# Patient Record
Sex: Female | Born: 1970 | Race: White | Hispanic: No | Smoking: Never smoker
Health system: Southern US, Community
[De-identification: ages and names within clinical notes are randomized; demographics above are authoritative.]

## PROBLEM LIST (undated history)

## (undated) DIAGNOSIS — I10 Essential (primary) hypertension: Secondary | ICD-10-CM

## (undated) DIAGNOSIS — Z85820 Personal history of malignant melanoma of skin: Secondary | ICD-10-CM

## (undated) DIAGNOSIS — Z9889 Other specified postprocedural states: Secondary | ICD-10-CM

## (undated) DIAGNOSIS — N83209 Unspecified ovarian cyst, unspecified side: Secondary | ICD-10-CM

## (undated) DIAGNOSIS — C439 Malignant melanoma of skin, unspecified: Secondary | ICD-10-CM

## (undated) DIAGNOSIS — IMO0002 Reserved for concepts with insufficient information to code with codable children: Principal | ICD-10-CM

## (undated) DIAGNOSIS — F419 Anxiety disorder, unspecified: Secondary | ICD-10-CM

## (undated) DIAGNOSIS — N921 Excessive and frequent menstruation with irregular cycle: Secondary | ICD-10-CM

## (undated) DIAGNOSIS — J301 Allergic rhinitis due to pollen: Secondary | ICD-10-CM

## (undated) DIAGNOSIS — K649 Unspecified hemorrhoids: Secondary | ICD-10-CM

## (undated) DIAGNOSIS — R112 Nausea with vomiting, unspecified: Secondary | ICD-10-CM

## (undated) DIAGNOSIS — N840 Polyp of corpus uteri: Principal | ICD-10-CM

## (undated) HISTORY — DX: Polyp of corpus uteri: N84.0

## (undated) HISTORY — DX: Personal history of malignant melanoma of skin: Z85.820

## (undated) HISTORY — DX: Anxiety disorder, unspecified: F41.9

## (undated) HISTORY — DX: Excessive and frequent menstruation with irregular cycle: N92.1

## (undated) HISTORY — DX: Unspecified hemorrhoids: K64.9

## (undated) HISTORY — DX: Unspecified ovarian cyst, unspecified side: N83.209

## (undated) HISTORY — DX: Allergic rhinitis due to pollen: J30.1

## (undated) HISTORY — DX: Essential (primary) hypertension: I10

## (undated) HISTORY — PX: ECTOPIC PREGNANCY SURGERY: SHX613

## (undated) HISTORY — DX: Reserved for concepts with insufficient information to code with codable children: IMO0002

---

## 1998-05-29 ENCOUNTER — Other Ambulatory Visit: Admission: RE | Admit: 1998-05-29 | Discharge: 1998-05-29 | Payer: Self-pay | Admitting: Gynecology

## 2001-07-29 ENCOUNTER — Inpatient Hospital Stay (HOSPITAL_COMMUNITY): Admission: RE | Admit: 2001-07-29 | Discharge: 2001-07-31 | Payer: Self-pay | Admitting: Obstetrics and Gynecology

## 2003-06-13 ENCOUNTER — Ambulatory Visit (HOSPITAL_COMMUNITY): Admission: AD | Admit: 2003-06-13 | Discharge: 2003-06-13 | Payer: Self-pay | Admitting: Obstetrics and Gynecology

## 2003-09-07 ENCOUNTER — Ambulatory Visit (HOSPITAL_COMMUNITY): Admission: AD | Admit: 2003-09-07 | Discharge: 2003-09-07 | Payer: Self-pay | Admitting: Obstetrics and Gynecology

## 2003-11-30 ENCOUNTER — Inpatient Hospital Stay (HOSPITAL_COMMUNITY): Admission: RE | Admit: 2003-11-30 | Discharge: 2003-12-02 | Payer: Self-pay | Admitting: Obstetrics and Gynecology

## 2005-11-17 HISTORY — PX: SKIN CANCER EXCISION: SHX779

## 2007-07-21 ENCOUNTER — Ambulatory Visit (HOSPITAL_COMMUNITY): Admission: RE | Admit: 2007-07-21 | Discharge: 2007-07-21 | Payer: Self-pay | Admitting: Obstetrics and Gynecology

## 2008-12-08 ENCOUNTER — Other Ambulatory Visit: Admission: RE | Admit: 2008-12-08 | Discharge: 2008-12-08 | Payer: Self-pay | Admitting: Obstetrics and Gynecology

## 2009-07-04 ENCOUNTER — Ambulatory Visit (HOSPITAL_COMMUNITY): Admission: RE | Admit: 2009-07-04 | Discharge: 2009-07-04 | Payer: Self-pay | Admitting: Obstetrics & Gynecology

## 2009-10-22 ENCOUNTER — Ambulatory Visit (HOSPITAL_COMMUNITY): Admission: RE | Admit: 2009-10-22 | Discharge: 2009-10-22 | Payer: Self-pay | Admitting: Pediatrics

## 2009-11-13 ENCOUNTER — Ambulatory Visit (HOSPITAL_COMMUNITY): Admission: RE | Admit: 2009-11-13 | Discharge: 2009-11-13 | Payer: Self-pay | Admitting: Obstetrics and Gynecology

## 2009-11-13 ENCOUNTER — Ambulatory Visit (HOSPITAL_COMMUNITY): Admission: EM | Admit: 2009-11-13 | Discharge: 2009-11-13 | Payer: Self-pay | Admitting: Obstetrics and Gynecology

## 2010-10-09 ENCOUNTER — Other Ambulatory Visit: Admission: RE | Admit: 2010-10-09 | Discharge: 2010-10-09 | Payer: Self-pay | Admitting: Obstetrics and Gynecology

## 2011-04-04 NOTE — Discharge Summary (Signed)
NAME:  Melissa Harrington, Melissa Harrington                         ACCOUNT NO.:  [id number]   MEDICAL RECORD NO.:  [id number]                   PATIENT TYPE:  INP   LOCATION:  A410                                 FACILITY:  [facility]   PHYSICIAN:  Lazaro Arms, M.D.                DATE OF BIRTH:  06/01/1971   DATE OF ADMISSION:  11/30/2003  DATE OF DISCHARGE:                                 DISCHARGE SUMMARY   DISCHARGE DIAGNOSES:  1. Status post a repeat cesarean section, tubal ligation.  2. Unremarkable postoperative course.   PROCEDURES:  Repeat cesarean section and tubal ligation by Tilda Burrow,  M.D.   Please refer to the transcribed History and Physical, antepartum chart and  operative note for details for admissions to the hospital.   HOSPITAL COURSE:  The patient was admitted postoperatively.  Her cesarean  section went well, without problems.  She tolerated clear liquids and  regular diet.  She was voiding without symptoms.  She was ambulatory and had  normal bowel function progression.  Her postoperative day #1:  Hemoglobin  and hematocrit was 12.1 and 35.  White count 8400.  She was tolerating oral  pain medicine.  Prior to discharge, her incision was clean, dry and intact.  JP drain was putting out minimally and was removed on the evening of  postoperative day #1.  She was discharged to home on the morning of  postoperative day #2 on Motrin and Tylox for pain.  Instructions and  precautions for return and she will be seen in the office on Wednesday, to  have her incision assessed and staples removed.     ___________________________________________                                         Lazaro Arms, M.D.   Loraine Maple  D:  12/02/2003  T:  12/02/2003  Job:  [id number]

## 2011-04-04 NOTE — H&P (Signed)
NAME:  Melissa Harrington, Melissa Harrington                         ACCOUNT NO.:  [id number]   MEDICAL RECORD NO.:  [id number]                   PATIENT TYPE:  AMB   LOCATION:  DAY                                  FACILITY:  [facility]   PHYSICIAN:  Tilda Burrow, M.D.              DATE OF BIRTH:  March 15, 1971   DATE OF ADMISSION:  11/29/2002  DATE OF DISCHARGE:                                HISTORY & PHYSICAL   ADMITTING DIAGNOSES:  1. Pregnancy 38-1/[redacted] weeks gestation.  2. Prior cesarean section, not for trial of labor.  3. Desire for elective permanent sterilization with planned, wide excision     of cicatrix.   HISTORY OF PRESENT ILLNESS:  This 40 year old female gravida 5, para 2, AB  2, 2 prior ectopic pregnancies is admitted, at this time, for repeat  cesarean section, tubal ligation with planned wide excision of the old  cicatrix. Ayaat has been followed through our office since initial prenatal  visit April 21, 2003.  LMP March 02, 2003 placing menstrual [facility] December 06, 2003 with corresponding first and second trimester ultrasounds.  She has had  15 prenatal visits, appropriate weight gain, fundal height growth, and  desires to proceed to a permanent sterilization at the time of repeat  cesarean section.  Issues regarding tummy tuck have been discussed.  Auri  only has mild abdominal laxity, but has some retraction just above the old  cicatrix and desires revision of this.   PAST MEDICAL HISTORY:  1. Pregnancy induced hypertension with 2 pregnancies.  2. History of multiple allergies.   PAST SURGICAL HISTORY:  1. Cesarean section x2.  2. Laparoscopic treatment of ectopic pregnancy x2, 1995 and 1997 allegedly     in the right tube each time.   SOCIAL HISTORY:  Married, Arts development officer.   HABITS:  Negative for cigarettes, alcohol, and recreational drugs.   PHYSICAL EXAMINATION:  VITAL SIGNS:  Height 5 feet 6 inches.  Weight 175-  1/2.  Blood pressure 122/74.  GENERAL:  Exam shows a healthy,  cheerful, energetic Caucasian female who is  tolerating the pregnancy well.  Pupils equal round, reactive.  Extraocular  movements are intact.  NECK:  Supple.  Trachea midline.  CHEST:  Clear to auscultation.  ABDOMEN:  Term size fetus.  Vertex presentation.  Fundal height 38 cm.  Fetal heart rate noted at 140, good fetal movement present.  Vertex  presentation confirmed.  PELVIC:  Exam deferred.   PRIOR LABORATORY DATA:  Includes blood type O positive, hemoglobin 14.3,  hematocrit 42.8, hepatitis, HIV, GC and Chlamydia all negative.  MSAFP  declined.   Additionally the patient's husband, Raiford Noble, will be at the delivery, under  spinal anesthesia.  Dr. Milford Cage has been notified.  Circumcision is planned.  She plans to breast feed.   ADDENDUM:  Additionally the patient has a small, 1 cm umbilical hernia which  is sensitive at this time.  It is our planned effort to attempt to close  this with permanent suture placed through the Pfannenstiel incision if  possible.  If necessary it might be necessary to use a separate incision  which has been discussed and the patient is aware.     ___________________________________________                                         Tilda Burrow, M.D.   JVF/MEDQ  D:  11/29/2003  T:  11/29/2003  Job:  [id number]   cc:   Tilda Burrow, M.D.  [address] [place]  [place] [address]  Fax: [phone]

## 2011-04-04 NOTE — Op Note (Signed)
NAME:  Melissa Harrington, Melissa Harrington                         ACCOUNT NO.:  [id number]   MEDICAL RECORD NO.:  [id number]                   PATIENT TYPE:  INP   LOCATION:  A404                                 FACILITY:  [facility]   PHYSICIAN:  Melissa Harrington, M.D.              DATE OF BIRTH:  06-22-1971   DATE OF PROCEDURE:  11/30/2003  DATE OF DISCHARGE:                                 OPERATIVE REPORT   PREOPERATIVE DIAGNOSES:  1. Pregnancy 39 weeks, repeat cesarean section, not for trial of labor.  2. Elective permanent sterilization.  3. Umbilical hernia.   POSTOPERATIVE DIAGNOSES:  1. Pregnancy 39 weeks, repeat cesarean section, not for trial of labor.  2. Elective permanent sterilization.   PROCEDURE:  1. Repeat low transverse cervical cesarean section.  2. Left partial salpingectomy.  3. Wide excision of cicatrix.   SURGEON:  Melissa Harrington, M.D.   ASSISTANTAmie Harrington, CST   ANESTHESIA:  Spinal.   COMPLICATIONS:  None.   FINDINGS:  Absence of right tube.  Normal appearing ovaries bilaterally.  Healthy, 8 pound 0.5 ounce female infant, Apgars 9 and 9; cared for by Dr.  Marvel Plan.  Umbilical hernia was inspected from beneath and there was only a  slight dimpling of the fascia, but not a complete fascial defect; so, after  discussion with the patient, we decided against any attempts to revise the  protuberant umbilicus.   DETAILS OF PROCEDURE:  The patient was taken to the operating room and  spinal anesthesia introduced; prepped; Foley catheter inserted; and abdomen  prepped and draped for lower abdominal surgery.  The old scar, which was  actually healed quite well with slight overlap on the left side, was widely  excised removing a 2 cm wide ellipse of skin and underlying fatty tissues.  The fascia was opened transversely and dissected off the underlying rectus  muscles with only slight difficulty due to dense adhesions.  The peritoneal  cavity was entered without difficulty and  bladder flap developed on the  lower uterine segment.  A transverse uterine incision was made and then the  amniotic fluid membranes ruptured and opened; and the infant delivered by  fundal pressure and manual guidance of the vertex.  The cord was clamped,  and Melissa Harrington was passed to Dr. Marvel Plan for his care.  See his notes for  additional details.  Apgars 9 and 9 were assigned.   Cord blood samples were obtained.  The placenta delivered Melissa Harrington  presentation, intact.  Antibiotic irrigation of the uterine cavity then  performed and then the anterior lower uterine segment, uterine incision,  closed using a single layer of running, locking, 2-0 chromic. The patient  then had 2-0 chromic reapproximation of the bladder flap.   STERILIZATION:  Sterilization was then performed by identifying, first that  the right tube had been removed in its entirety and there were no  significant adhesions in the area.  The left tube was inspected, identified  to its fimbriated end; a midsegment knuckle of the tube doubly ligated by  incarcerating a knuckle of tube and 2-0 chromic ligature. The specimen was  removed for histology confirmation and the procedure considered complete.   The peritoneal cavity was irrigated with antibiotic solution.  A couple of  point cautery sites on the bladder flap cauterized and then the anterior  peritoneum closed with a running 2-0 chromic.  The rectus muscles were  closed using interrupted 2-0 chromic.  Fascial edges were trimmed to improve  lower abdominal tone and then pulled together with continuous running #0  Vicryl.  The subcutaneous tissues were reapproximated with interrupted 2-0  plain over a flat, JP drain placed in subcu fatty space area.  Point cautery  was used, as necessary, to achieve hemostasis and then the staple closure of  the skin completed the surgical procedure with bandages placed with slight  pressure dressing.       ___________________________________________                                            Melissa Harrington, M.D.   JVF/MEDQ  D:  11/30/2003  T:  11/30/2003  Job:  [id number]

## 2011-11-18 DIAGNOSIS — N921 Excessive and frequent menstruation with irregular cycle: Secondary | ICD-10-CM

## 2011-11-18 HISTORY — DX: Excessive and frequent menstruation with irregular cycle: N92.1

## 2012-06-30 ENCOUNTER — Other Ambulatory Visit: Payer: Self-pay | Admitting: Obstetrics and Gynecology

## 2012-06-30 ENCOUNTER — Other Ambulatory Visit (HOSPITAL_COMMUNITY)
Admission: RE | Admit: 2012-06-30 | Discharge: 2012-06-30 | Disposition: A | Discharge status: Home / Self Care | Payer: 59 | Source: Ambulatory Visit | Attending: Obstetrics and Gynecology | Admitting: Obstetrics and Gynecology

## 2012-06-30 DIAGNOSIS — Z01419 Encounter for gynecological examination (general) (routine) without abnormal findings: Secondary | ICD-10-CM | POA: Insufficient documentation

## 2013-10-10 ENCOUNTER — Ambulatory Visit (INDEPENDENT_AMBULATORY_CARE_PROVIDER_SITE_OTHER): Payer: 59 | Admitting: Adult Health

## 2013-10-10 ENCOUNTER — Encounter: Payer: Self-pay | Admitting: Adult Health

## 2013-10-10 ENCOUNTER — Encounter (INDEPENDENT_AMBULATORY_CARE_PROVIDER_SITE_OTHER): Payer: Self-pay

## 2013-10-10 VITALS — BP 118/70 | Ht 68.0 in | Wt 160.0 lb

## 2013-10-10 DIAGNOSIS — K649 Unspecified hemorrhoids: Secondary | ICD-10-CM | POA: Insufficient documentation

## 2013-10-10 DIAGNOSIS — L293 Anogenital pruritus, unspecified: Secondary | ICD-10-CM

## 2013-10-10 DIAGNOSIS — N898 Other specified noninflammatory disorders of vagina: Secondary | ICD-10-CM

## 2013-10-10 DIAGNOSIS — IMO0002 Reserved for concepts with insufficient information to code with codable children: Secondary | ICD-10-CM

## 2013-10-10 HISTORY — DX: Reserved for concepts with insufficient information to code with codable children: IMO0002

## 2013-10-10 HISTORY — DX: Unspecified hemorrhoids: K64.9

## 2013-10-10 MED ORDER — HYDROCORTISONE ACE-PRAMOXINE 1-1 % RE CREA: 1.0000 "application " | TOPICAL_CREAM | Freq: Two times a day (BID) | RECTAL | Status: DC

## 2013-10-10 NOTE — Patient Instructions (Signed)
Hemorrhoids Hemorrhoids are swollen veins around the rectum or anus. There are two types of hemorrhoids:   Internal hemorrhoids. These occur in the veins just inside the rectum. They may poke through to the outside and become irritated and painful.  External hemorrhoids. These occur in the veins outside the anus and can be felt as a painful swelling or hard lump near the anus. CAUSES  Pregnancy.   Obesity.   Constipation or diarrhea.   Straining to have a bowel movement.   Sitting for long periods on the toilet.  Heavy lifting or other activity that caused you to strain.  Anal intercourse. SYMPTOMS   Pain.   Anal itching or irritation.   Rectal bleeding.   Fecal leakage.   Anal swelling.   One or more lumps around the anus.  DIAGNOSIS  Your caregiver may be able to diagnose hemorrhoids by visual examination. Other examinations or tests that may be performed include:   Examination of the rectal area with a gloved hand (digital rectal exam).   Examination of anal canal using a small tube (scope).   A blood test if you have lost a significant amount of blood.  A test to look inside the colon (sigmoidoscopy or colonoscopy). TREATMENT Most hemorrhoids can be treated at home. However, if symptoms do not seem to be getting better or if you have a lot of rectal bleeding, your caregiver may perform a procedure to help make the hemorrhoids get smaller or remove them completely. Possible treatments include:   Placing a rubber band at the base of the hemorrhoid to cut off the circulation (rubber band ligation).   Injecting a chemical to shrink the hemorrhoid (sclerotherapy).   Using a tool to burn the hemorrhoid (infrared light therapy).   Surgically removing the hemorrhoid (hemorrhoidectomy).   Stapling the hemorrhoid to block blood flow to the tissue (hemorrhoid stapling).  HOME CARE INSTRUCTIONS   Eat foods with fiber, such as whole grains, beans,  nuts, fruits, and vegetables. Ask your doctor about taking products with added fiber in them (fibersupplements).  Increase fluid intake. Drink enough water and fluids to keep your urine clear or pale yellow.   Exercise regularly.   Go to the bathroom when you have the urge to have a bowel movement. Do not wait.   Avoid straining to have bowel movements.   Keep the anal area dry and clean. Use wet toilet paper or moist towelettes after a bowel movement.   Medicated creams and suppositories may be used or applied as directed.   Only take over-the-counter or prescription medicines as directed by your caregiver.   Take warm sitz baths for 15 20 minutes, 3 4 times a day to ease pain and discomfort.   Place ice packs on the hemorrhoids if they are tender and swollen. Using ice packs between sitz baths may be helpful.   Put ice in a plastic bag.   Place a towel between your skin and the bag.   Leave the ice on for 15 20 minutes, 3 4 times a day.   Do not use a donut-shaped pillow or sit on the toilet for long periods. This increases blood pooling and pain.  SEEK MEDICAL CARE IF:  You have increasing pain and swelling that is not controlled by treatment or medicine.  You have uncontrolled bleeding.  You have difficulty or you are unable to have a bowel movement.  You have pain or inflammation outside the area of the hemorrhoids. MAKE SURE YOU:    Understand these instructions.  Will watch your condition.  Will get help right away if you are not doing well or get worse. Document Released: 10/31/2000 Document Revised: 10/20/2012 Document Reviewed: 09/07/2012 [facility] Patient Information 2014 [place], [place]. Follow up in 1 week for [place]

## 2013-10-10 NOTE — Progress Notes (Signed)
Subjective:     Patient ID: Melissa Harrington, female   DOB: 03-22-1971, 42 y.o.   MRN: [id number]  HPI Melissa Harrington is a 42 year old white female in complaining of pain with sex and vaginal/rectal itch,has hemorrhoids.Said she felt pressure and achy after sex, like contraction.And period cycles are shorter. Had some itching and used Monistat in past.  Review of Systems See HPI Reviewed past medical,surgical, social and family history. Reviewed medications and allergies.     Objective:   Physical Exam BP 118/70  Ht 5\' 8"  (1.727 m)  Wt 160 lb (72.576 kg)  BMI 24.33 kg/m2  LMP 09/29/2013   Skin warm and dry.Pelvic: external genitalia is normal in appearance, vagina: scant discharge without odor, cervix:smooth and bulbous, uterus: normal size, shape and contour, non tender, no masses felt, adnexa: no masses or tenderness noted. On rectal exam has hemorrhoids and redness at anal opening, I did not do rectal exam at this time. Assessment:     Dyspareunia Hemorrhoids Vaginal/rectal itch    Plan:     Rx anal pram HC to use 2-3  X daily Follow up in 1 week for [place]   review handout on hemorrhoids

## 2013-10-17 ENCOUNTER — Encounter: Payer: Self-pay | Admitting: Adult Health

## 2013-10-17 ENCOUNTER — Other Ambulatory Visit: Payer: Self-pay | Admitting: Adult Health

## 2013-10-17 ENCOUNTER — Ambulatory Visit (INDEPENDENT_AMBULATORY_CARE_PROVIDER_SITE_OTHER): Payer: 59 | Admitting: Adult Health

## 2013-10-17 ENCOUNTER — Ambulatory Visit (INDEPENDENT_AMBULATORY_CARE_PROVIDER_SITE_OTHER): Payer: 59

## 2013-10-17 VITALS — BP 130/82 | Ht 68.0 in | Wt 160.0 lb

## 2013-10-17 DIAGNOSIS — N898 Other specified noninflammatory disorders of vagina: Secondary | ICD-10-CM

## 2013-10-17 DIAGNOSIS — IMO0002 Reserved for concepts with insufficient information to code with codable children: Secondary | ICD-10-CM

## 2013-10-17 DIAGNOSIS — N926 Irregular menstruation, unspecified: Secondary | ICD-10-CM

## 2013-10-17 DIAGNOSIS — N83209 Unspecified ovarian cyst, unspecified side: Secondary | ICD-10-CM | POA: Insufficient documentation

## 2013-10-17 DIAGNOSIS — N840 Polyp of corpus uteri: Secondary | ICD-10-CM

## 2013-10-17 DIAGNOSIS — L293 Anogenital pruritus, unspecified: Secondary | ICD-10-CM

## 2013-10-17 HISTORY — DX: Unspecified ovarian cyst, unspecified side: N83.209

## 2013-10-17 HISTORY — DX: Polyp of corpus uteri: N84.0

## 2013-10-17 NOTE — Patient Instructions (Signed)
Ovarian Cyst The ovaries are small organs that are on each side of the uterus. The ovaries are the organs that produce the female hormones, estrogen and progesterone. An ovarian cyst is a sac filled with fluid that can vary in its size. It is normal for a small cyst to form in women who are in the childbearing age and who have menstrual periods. This type of cyst is called a follicle cyst that becomes an ovulation cyst (corpus luteum cyst) after it produces the women's egg. It later goes away on its own if the woman does not become pregnant. There are other kinds of ovarian cysts that may cause problems and may need to be treated. The most serious problem is a cyst with cancer. It should be noted that menopausal women who have an ovarian cyst are at a higher risk of it being a cancer cyst. They should be evaluated very quickly, thoroughly and followed closely. This is especially true in menopausal women because of the high rate of ovarian cancer in women in menopause. CAUSES AND TYPES OF OVARIAN CYSTS:  FUNCTIONAL CYST: The follicle/corpus luteum cyst is a functional cyst that occurs every month during ovulation with the menstrual cycle. They go away with the next menstrual cycle if the woman does not get pregnant. Usually, there are no symptoms with a functional cyst.  ENDOMETRIOMA CYST: This cyst develops from the lining of the uterus tissue. This cyst gets in or on the ovary. It grows every month from the bleeding during the menstrual period. It is also called a "chocolate cyst" because it becomes filled with blood that turns brown. This cyst can cause pain in the lower abdomen during intercourse and with your menstrual period.  CYSTADENOMA CYST: This cyst develops from the cells on the outside of the ovary. They usually are not cancerous. They can get very big and cause lower abdomen pain and pain with intercourse. This type of cyst can twist on itself, cut off its blood supply and cause severe pain. It  also can easily rupture and cause a lot of pain.  DERMOID CYST: This type of cyst is sometimes found in both ovaries. They are found to have different kinds of body tissue in the cyst. The tissue includes skin, teeth, hair, and/or cartilage. They usually do not have symptoms unless they get very big. Dermoid cysts are rarely cancerous.  POLYCYSTIC OVARY: This is a rare condition with hormone problems that produces many small cysts on both ovaries. The cysts are follicle-like cysts that never produce an egg and become a corpus luteum. It can cause an increase in body weight, infertility, acne, increase in body and facial hair and lack of menstrual periods or rare menstrual periods. Many women with this problem develop type 2 diabetes. The exact cause of this problem is unknown. A polycystic ovary is rarely cancerous.  THECA LUTEIN CYST: Occurs when too much hormone (human chorionic gonadotropin) is produced and over-stimulates the ovaries to produce an egg. They are frequently seen when doctors stimulate the ovaries for invitro-fertilization (test tube babies).  LUTEOMA CYST: This cyst is seen during pregnancy. Rarely it can cause an obstruction to the birth canal during labor and delivery. They usually go away after delivery. SYMPTOMS   Pelvic pain or pressure.  Pain during sexual intercourse.  Increasing girth (swelling) of the abdomen.  Abnormal menstrual periods.  Increasing pain with menstrual periods.  You stop having menstrual periods and you are not pregnant. DIAGNOSIS  The diagnosis can   be made during:  Routine or annual pelvic examination (common).  Ultrasound.  X-ray of the pelvis.  CT Scan.  MRI.  Blood tests. TREATMENT   Treatment may only be to follow the cyst monthly for 2 to 3 months with your caregiver. Many go away on their own, especially functional cysts.  May be aspirated (drained) with a long needle with ultrasound, or by laparoscopy (inserting a tube into  the pelvis through a small incision).  The whole cyst can be removed by laparoscopy.  Sometimes the cyst may need to be removed through an incision in the lower abdomen.  Hormone treatment is sometimes used to help dissolve certain cysts.  Birth control pills are sometimes used to help dissolve certain cysts. HOME CARE INSTRUCTIONS  Follow your caregiver's advice regarding:  Medicine.  Follow up visits to evaluate and treat the cyst.  You may need to come back or make an appointment with another caregiver, to find the exact cause of your cyst, if your caregiver is not a gynecologist.  Get your yearly and recommended pelvic examinations and Pap tests.  Let your caregiver know if you have had an ovarian cyst in the past. SEEK MEDICAL CARE IF:   Your periods are late, irregular, they stop, or are painful.  Your stomach (abdomen) or pelvic pain does not go away.  Your stomach becomes larger or swollen.  You have pressure on your bladder or trouble emptying your bladder completely.  You have painful sexual intercourse.  You have feelings of fullness, pressure, or discomfort in your stomach.  You lose weight for no apparent reason.  You feel generally ill.  You become constipated.  You lose your appetite.  You develop acne.  You have an increase in body and facial hair.  You are gaining weight, without changing your exercise and eating habits.  You think you are pregnant. SEEK IMMEDIATE MEDICAL CARE IF:   You have increasing abdominal pain.  You feel sick to your stomach (nausea) and/or vomit.  You develop a fever that comes on suddenly.  You develop abdominal pain during a bowel movement.  Your menstrual periods become heavier than usual. Document Released: 11/03/2005 Document Revised: 01/26/2012 Document Reviewed: 09/06/2009 [facility] Patient Information 2014 [place], [place]. Endometrial Biopsy Endometrial biopsy is a procedure in which a tissue sample is  taken from inside the uterus. The tissue sample is then looked at under a microscope to see if the tissue is normal or abnormal. The endometrium is the lining of the uterus. This procedure helps determine where you are in your menstrual cycle and how hormone levels are affecting the lining of the uterus. This procedure may also be used to evaluate uterine bleeding or to diagnose endometrial cancer, tuberculosis, polyps, or inflammatory conditions.  LET [facility] CARE PROVIDER KNOW ABOUT:  Any allergies you have.  All medicines you are taking, including vitamins, herbs, eye drops, creams, and over-the-counter medicines.  Previous problems you or members of your family have had with the use of anesthetics.  Any blood disorders you have.  Previous surgeries you have had.  Medical conditions you have.  Possibility of pregnancy. RISKS AND COMPLICATIONS Generally, this is a safe procedure. However, as with any procedure, complications can occur. Possible complications include:  Bleeding.  Pelvic infection.  Puncture of the uterine wall with the biopsy device (rare). BEFORE THE PROCEDURE   Keep a record of your menstrual cycles as directed by your health care provider. You may need to schedule your procedure  for a specific time in your cycle.  You may want to bring a sanitary pad to wear home after the procedure.  Arrange for someone to drive you home after the procedure if you will be given a medicine to help you relax (sedative). PROCEDURE   You may be given a sedative to relax you.  You will lie on an exam table with your feet and legs supported as in a pelvic exam.  Your health care provider will insert an instrument (speculum) into your vagina to see your cervix.  Your cervix will be cleansed with an antiseptic solution. A medicine (local anesthetic) will be used to numb the cervix.  A forceps instrument (tenaculum) will be used to hold your cervix steady for the  biopsy.  A thin, rodlike instrument (uterine sound) will be inserted through your cervix to determine the length of your uterus and the location where the biopsy sample will be removed.  A thin, flexible tube (catheter) will be inserted through your cervix and into the uterus. The catheter is used to collect the biopsy sample from your endometrial tissue.  The catheter and speculum will then be removed, and the tissue sample will be sent to a lab for examination. AFTER THE PROCEDURE  You will rest in a recovery area until you are ready to go home.  You may have mild cramping and a small amount of vaginal bleeding for a few days after the procedure. This is normal.  Make sure you find out how to get your test results. Document Released: 03/06/2005 Document Revised: 07/06/2013 Document Reviewed: 04/20/2013 [facility] Patient Information 2014 [place], [place]. Supracervical Hysterectomy A supracervical hysterectomy is minimally invasive surgery to remove the top part of the uterus, but not the cervix. This surgery can be performed by making a large cut (incision) in the abdomen. It can also be done with a thin, lighted tube (laparoscope) inserted into 2 small incisions in the lower abdomen. Your fallopian tubes and ovaries can be removed (bilateral salpingo-oopherectomy) during this surgery as well. If a supracervical hysterectomy is started and it is not safe to continue, the laparoscopic surgery will be converted to an open abdominal surgery. You will not have menstrual periods or be able to get pregnant after having this surgery. If a bilateral salpingo-oopherectomy was performed before menopause, you will go through a sudden (abrupt) menopause. This can be helped with hormone medicines. Benefits of minimally invasive surgery include:  Less pain.  Less risk of blood loss.  Less risk of infection.  Quicker return to normal activities.  Usually a 1 night stay in the hospital.  Overall  patient satisfaction. LET YOUR CAREGIVER KNOW ABOUT:  Any history of abnormal Pap tests.  Allergies to food or medicine.  Medicines taken, including vitamins, herbs, eyedrops, over-the-counter medicines, and creams.  Use of steroids (by mouth or creams).  Previous problems with anesthetics or numbing medicines.  History of bleeding problems or blood clots.  Previous surgery.  Other health problems, including diabetes and kidney problems.  Any infections or colds you may have developed.  Symptoms of irregular or heavy periods, weight loss, or urinary or bowel changes. RISKS AND COMPLICATIONS   Bleeding.  Blood clots in the legs or lung.  Infection.  Injury to surrounding organs.  Problems with anesthesia.  Risk of conversion to an open abdominal incision.  Early menopause symptoms (hot flashes, night sweats, insomnia).  Additional surgery later to remove the cervix if you have problems with the cervix. BEFORE THE  PROCEDURE  Ask your caregiver about changing or stopping your regular medicines.  Do not take aspirin or blood thinners (anticoagulants) for 1 week before the surgery, or as told by your caregiver.  Do not eat or drink anything for 8 hours before the surgery, or as told by your caregiver.  Quit smoking if you smoke.  Arrange for a ride home after surgery and for someone to help you at home during recovery. PROCEDURE   You will be given an antibiotic medicine.  An intravenous (IV) line will be placed in your arm. You will be given medicine to make you sleep (general anesthetic).  A gas (carbon dioxide) will be used to inflate your abdomen. This will allow your surgeon to look inside your abdomen, perform your surgery, and treat any other problems found if necessary.  Three or four small incisions (often less than  inch) will be made in your abdomen. One of these incisions will be made in the area of your belly button (navel). The laparoscope will be  inserted into the incision. Your surgeon will look through the laparoscope while doing your procedure.  Other surgical instruments will be inserted through the other incisions.  The uterus will be cut into small pieces and removed through the small incisions.  Your incisions will be closed. AFTER THE PROCEDURE   The gas will be released from inside your abdomen.  You will be taken to the recovery area where a nurse will watch and check your progress. Once you are awake, stable, and taking fluids well, without other problems, you will return to your room or be allowed to go home.  There is usually minimal discomfort following the surgery because the incisions are so small.  You will be given pain medicine while you are in the hospital and for when you go home.  Try to have someone with you for the first 3 to 5 days after you go home.  Follow up with your surgeon in 2 to 4 weeks after surgery to evaluate your progress. Document Released: 04/21/2008 Document Revised: 01/26/2012 Document Reviewed: 05/06/2013 [facility] Patient Information 2014 [place], [place]. Return in 4 days for endo biopsy

## 2013-10-17 NOTE — Progress Notes (Signed)
Subjective:     Patient ID: Melissa Harrington, female   DOB: [birth year], 42 y.o.   MRN: [id number]  HPI [name] is a 42 year old white female in for [place] for dyspareunia and some irregular cycles.Her hemorrhoids are better but still has some itching in vaginal area.  Review of Systems See HPI Reviewed past medical,surgical, social and family history. Reviewed medications and allergies.      Objective:   Physical Exam BP 130/82  Ht 5\' 8"  (1.727 m)  Wt 160 lb (72.576 kg)  BMI 24.33 kg/m2  LMP 09/29/2013   Reviewed [place] with pt. Has a 10.4 x 6 x 5 cm uterus without masses but the endometrium is asymmetrical and measures 13 mm and has ?11 mm polyp, and a left ovarian cyst and a hypoechoic area right ovary.Discussed option of D&C and she wants to discuss hysterectomy.   Assessment:    Endometrial polyp Ovarian cyst Dyspareunia and irregular cycles Vaginal itch      Plan:     Return in 4 days to see Dr Emelda Fear for endometrial biopsy Review handouts on supra cervical hysterectomy, ovarian cyst and endo biopsy   Continue cream

## 2013-10-21 ENCOUNTER — Encounter: Payer: Self-pay | Admitting: Obstetrics and Gynecology

## 2013-10-21 ENCOUNTER — Ambulatory Visit (INDEPENDENT_AMBULATORY_CARE_PROVIDER_SITE_OTHER): Payer: 59 | Admitting: Obstetrics and Gynecology

## 2013-10-21 ENCOUNTER — Other Ambulatory Visit: Payer: Self-pay | Admitting: Obstetrics and Gynecology

## 2013-10-21 VITALS — BP 120/80 | Ht 68.0 in | Wt 160.0 lb

## 2013-10-21 DIAGNOSIS — Z3200 Encounter for pregnancy test, result unknown: Secondary | ICD-10-CM

## 2013-10-21 DIAGNOSIS — N840 Polyp of corpus uteri: Secondary | ICD-10-CM

## 2013-10-21 DIAGNOSIS — N920 Excessive and frequent menstruation with regular cycle: Secondary | ICD-10-CM

## 2013-10-21 DIAGNOSIS — Z3202 Encounter for pregnancy test, result negative: Secondary | ICD-10-CM

## 2013-10-21 LAB — POCT URINE PREGNANCY: Preg Test, Ur: NEGATIVE

## 2013-10-21 NOTE — Patient Instructions (Signed)

## 2013-10-21 NOTE — Progress Notes (Signed)
Patient ID: JACQUEL REDDITT, female   DOB: [birth year], 42 y.o.   MRN: [id number] Pt has endometrial polyp. Pt here for endometrial tissue biopsy.   [facility] Visit  Patient name: ALEEHA BOLINE MRN [id number]  Date of birth: June 01, 1971  CC & HPI:  VENIDA TSUKAMOTO is a 42 y.o. female presenting today for endometrial biopsy. Polyp  On u/s ROS:    Pertinent History Reviewed:  Medical & Surgical Hx:  Revi Social History: Reviewed -  reports that she has never smoked. She has never used smokeless tobacco.  Objective Findings:  Vitals: BP 120/80  Ht 5\' 8"  (1.727 m)  Wt 160 lb (72.576 kg)  BMI 24.33 kg/m2  LMP 09/29/2013  Physical Examination:  Endometrial Biopsy: Patient given informed consent, signed copy in the chart, time out was performed. Time out taken. . The patient was placed in the lithotomy position and the cervix brought into view with sterile speculum.  Portio of cervix cleansed x 2 with betadine swabs.  A tenaculum was placed in the anterior lip of the cervix. The uterus was sounded for depth of 11 cm,. Milex uterine Explora 3 mm was introduced to into the uterus, suction created,  and an endometrial sample was obtained. All equipment was removed and accounted for.   The patient tolerated the procedure well.    Patient given post procedure instructions.  Followup: discuss results   Assessment & Plan:   Endometrial polyp, menorrhagia

## 2013-10-27 ENCOUNTER — Telehealth: Payer: Self-pay | Admitting: *Deleted

## 2013-10-27 NOTE — Telephone Encounter (Signed)
Pt aware of results 

## 2013-10-27 NOTE — Telephone Encounter (Signed)
Message copied by Richardson Chiquito on Thu Oct 27, 2013  8:31 AM ------      Message from: Tilda Burrow      Created: Wed Oct 26, 2013  5:14 PM       Benign endometrium, please notify pt ------

## 2013-10-31 NOTE — Progress Notes (Signed)
Pt aware of results 

## 2013-11-07 ENCOUNTER — Telehealth: Payer: Self-pay | Admitting: *Deleted

## 2013-11-07 NOTE — Telephone Encounter (Signed)
Pt notified of benign endometrial biopsy

## 2013-11-07 NOTE — Telephone Encounter (Signed)
Message copied by Criss Alvine on Mon Nov 07, 2013  4:10 PM ------      Message from: Tilda Burrow      Created: Fri Oct 28, 2013  6:44 PM       Benign endometrium , please notify pt ------

## 2013-11-07 NOTE — Telephone Encounter (Signed)
Message copied by Criss Alvine on Mon Nov 07, 2013  4:14 PM ------      Message from: Tilda Burrow      Created: Fri Oct 28, 2013  6:44 PM       Benign endometrium , please notify pt ------

## 2013-11-21 ENCOUNTER — Ambulatory Visit (INDEPENDENT_AMBULATORY_CARE_PROVIDER_SITE_OTHER): Payer: 59 | Admitting: Obstetrics and Gynecology

## 2013-11-21 ENCOUNTER — Encounter (INDEPENDENT_AMBULATORY_CARE_PROVIDER_SITE_OTHER): Payer: Self-pay

## 2013-11-21 ENCOUNTER — Encounter: Payer: Self-pay | Admitting: Obstetrics and Gynecology

## 2013-11-21 VITALS — BP 120/80 | Ht 68.0 in | Wt 159.8 lb

## 2013-11-21 DIAGNOSIS — N920 Excessive and frequent menstruation with regular cycle: Secondary | ICD-10-CM

## 2013-11-21 DIAGNOSIS — N921 Excessive and frequent menstruation with irregular cycle: Secondary | ICD-10-CM

## 2013-11-21 DIAGNOSIS — IMO0002 Reserved for concepts with insufficient information to code with codable children: Secondary | ICD-10-CM

## 2013-11-21 NOTE — Progress Notes (Signed)
Patient ID: Melissa Harrington, female   DOB: 18-Sep-1971, 43 y.o.   MRN: [id number]  Chief Complaint  Patient presents with  . Pre-op Exam     HPI  HPI Melissa Harrington is a 43 y.o. Female who presents for a pre-op evaluation today. She reports having an endometrial biopsy which showed benign secretory endometrium, no atypia, hyperplasia or malignancy. She reports have no vaginal bleeding for the past 3 weeks but has an almost 2 years history of heavy and irregular menstrual cycles. She has also been experiencing dyspareunia for the past couple of months. She denies trouble with bladder or bowel function. She is considering either an endometrial ablation or a supracervical hysterectomy.  Lengthy discussion of pros and cons of both procedures covered with multiple questions addressed. Pt has dyspareunia, without SUI ,UI, or bowel complaints.   Past Medical History  Diagnosis Date  . Cancer     melenoma   . Vaginal itching 10/10/2013  . Hemorrhoids 10/10/2013  . Dyspareunia 10/10/2013  . Endometrial polyp 10/17/2013  . Other and unspecified ovarian cyst 10/17/2013    Past Surgical History  Procedure Laterality Date  . Ectopic pregnancy surgery      four surgeries  . Cesarean section      3  . Skin cancer excision      on pt's back     Family History  Problem Relation Age of Onset  . Cancer Mother     lung   . Mental illness Father   . Heart disease Maternal Grandmother     Social History History  Substance Use Topics  . Smoking status: Never Smoker   . Smokeless tobacco: Never Used  . Alcohol Use: 1.2 oz/week    2 Glasses of wine per week    Allergies  Allergen Reactions  . Penicillins Hives    Current Outpatient Prescriptions  Medication Sig Dispense Refill  . pramoxine-hydrocortisone (PROCTOCREAM-HC) 1-1 % rectal cream Place 1 application rectally 2 (two) times daily.  30 g  1   No current facility-administered medications for this visit.    Review of  Systems Review of Systems  Genitourinary: Positive for dyspareunia.    Blood pressure 120/80, height 5\' 8"  (1.727 m), weight 159 lb 12.8 oz (72.485 kg), last menstrual period 10/26/2013.  Physical Exam Physical Exam  Nursing note and vitals reviewed. Constitutional: She is oriented to person, place, and time. She appears well-developed and well-nourished. No distress.  HENT:  Head: Normocephalic and atraumatic.  Neck: Normal range of motion.  Cardiovascular: Normal rate.   Pulmonary/Chest: Effort normal. No respiratory distress.  Abdominal: She exhibits no distension.  Musculoskeletal: Normal range of motion.  Neurological: She is alert and oriented to person, place, and time.  Skin: Skin is warm and dry. She is not diaphoretic.  Psychiatric: She has a normal mood and affect. Her behavior is normal.    Data Reviewed Old records. Pt is s/p bilat salpingectomy (ectopics)  Assessment    Endometrial polyp menometrorhagia Deep thrust dyspareunia       Plan    Plans to have either an endometrial ablation or a supracervical hysterectomy. She will investigate online, check coverages, and let us know. F/u prn       Martrice Apt V 11/21/2013, 3:58 PM

## 2013-11-21 NOTE — Patient Instructions (Signed)
Supracervical Hysterectomy A supracervical hysterectomy is minimally invasive surgery to remove the top part of the uterus, but not the cervix. This surgery can be performed by making a large cut (incision) in the abdomen. It can also be done with a thin, lighted tube (laparoscope) inserted into 2 small incisions in the lower abdomen. Your fallopian tubes and ovaries can be removed (bilateral salpingo-oopherectomy) during this surgery as well. If a supracervical hysterectomy is started and it is not safe to continue, the laparoscopic surgery will be converted to an open abdominal surgery. You will not have menstrual periods or be able to get pregnant after having this surgery. If a bilateral salpingo-oopherectomy was performed before menopause, you will go through a sudden (abrupt) menopause. This can be helped with hormone medicines. Benefits of minimally invasive surgery include:  Less pain.  Less risk of blood loss.  Less risk of infection.  Quicker return to normal activities.  Usually a 1 night stay in the hospital.  Overall patient satisfaction. LET YOUR CAREGIVER KNOW ABOUT:  Any history of abnormal Pap tests.  Allergies to food or medicine.  Medicines taken, including vitamins, herbs, eyedrops, over-the-counter medicines, and creams.  Use of steroids (by mouth or creams).  Previous problems with anesthetics or numbing medicines.  History of bleeding problems or blood clots.  Previous surgery.  Other health problems, including diabetes and kidney problems.  Any infections or colds you may have developed.  Symptoms of irregular or heavy periods, weight loss, or urinary or bowel changes. RISKS AND COMPLICATIONS   Bleeding.  Blood clots in the legs or lung.  Infection.  Injury to surrounding organs.  Problems with anesthesia.  Risk of conversion to an open abdominal incision.  Early menopause symptoms (hot flashes, night sweats, insomnia).  Additional surgery  later to remove the cervix if you have problems with the cervix. BEFORE THE PROCEDURE  Ask your caregiver about changing or stopping your regular medicines.  Do not take aspirin or blood thinners (anticoagulants) for 1 week before the surgery, or as told by your caregiver.  Do not eat or drink anything for 8 hours before the surgery, or as told by your caregiver.  Quit smoking if you smoke.  Arrange for a ride home after surgery and for someone to help you at home during recovery. PROCEDURE   You will be given an antibiotic medicine.  An intravenous (IV) line will be placed in your arm. You will be given medicine to make you sleep (general anesthetic).  A gas (carbon dioxide) will be used to inflate your abdomen. This will allow your surgeon to look inside your abdomen, perform your surgery, and treat any other problems found if necessary.  Three or four small incisions (often less than  inch) will be made in your abdomen. One of these incisions will be made in the area of your belly button (navel). The laparoscope will be inserted into the incision. Your surgeon will look through the laparoscope while doing your procedure.  Other surgical instruments will be inserted through the other incisions.  The uterus will be cut into small pieces and removed through the small incisions.  Your incisions will be closed. AFTER THE PROCEDURE   The gas will be released from inside your abdomen.  You will be taken to the recovery area where a nurse will watch and check your progress. Once you are awake, stable, and taking fluids well, without other problems, you will return to your room or be allowed to go  home.  There is usually minimal discomfort following the surgery because the incisions are so small.  You will be given pain medicine while you are in the hospital and for when you go home.  Try to have someone with you for the first 3 to 5 days after you go home.  Follow up with your  surgeon in 2 to 4 weeks after surgery to evaluate your progress. Document Released: 04/21/2008 Document Revised: 01/26/2012 Document Reviewed: 05/06/2013 [facility] Patient Information 2014 [place].

## 2014-09-18 ENCOUNTER — Encounter: Payer: Self-pay | Admitting: Obstetrics and Gynecology

## 2016-06-17 ENCOUNTER — Encounter: Payer: Self-pay | Admitting: Family Medicine

## 2016-06-17 ENCOUNTER — Ambulatory Visit (INDEPENDENT_AMBULATORY_CARE_PROVIDER_SITE_OTHER): Payer: Commercial Managed Care - HMO | Admitting: Family Medicine

## 2016-06-17 VITALS — BP 125/90 | HR 67 | Temp 98.0°F | Resp 16 | Ht 68.0 in | Wt 148.5 lb

## 2016-06-17 DIAGNOSIS — S300XXA Contusion of lower back and pelvis, initial encounter: Secondary | ICD-10-CM

## 2016-06-17 NOTE — Progress Notes (Signed)
Pre visit review using our clinic review tool, if applicable. No additional management support is needed unless otherwise documented below in the visit note. 

## 2016-06-17 NOTE — Progress Notes (Signed)
Office Note 06/17/2016  CC:  Chief Complaint  Patient presents with  . Establish Care  . Fall    2 weeks ago, fell on her bottom, now has some bruising, knot and pain   HPI:  Melissa Harrington is a 45 y.o. White female who is here to establish care. Patient's most recent primary MD: Dr. Cleta Alberts at [facility] about 10 yrs ago. Old records in EPIC/[facility] EMR were reviewed prior to or during today's visit.  Two weeks ago she fell onto her bottom and has had pain/soreness, bruising, and feels a knot on left side. Taking ibup, using ice, soaking in epsom salt baths.  Past Medical History:  Diagnosis Date  . Cancer ([place])    melenoma   . Dyspareunia 10/10/2013  . Endometrial polyp 10/17/2013  . Hay fever   . Hemorrhoids 10/10/2013  . Menometrorrhagia 2013   using herbal treatments and this has resolved.  . Other and unspecified ovarian cyst 10/17/2013  . Vaginal itching 10/10/2013    Past Surgical History:  Procedure Laterality Date  . CESAREAN SECTION     3  . ECTOPIC PREGNANCY SURGERY     four surgeries (left fallopian tube removed)  . SKIN CANCER EXCISION  2007   on pt's back     Family History  Problem Relation Age of Onset  . Lung cancer Mother 84    Former smoker  . Mental illness Father   . Alcohol abuse Father   . Hypertension Father   . Heart disease Maternal Grandmother   . Alcohol abuse Maternal Grandmother   . Alcohol abuse Paternal Grandfather   . Alcohol abuse Paternal Grandmother   . Alcohol abuse Maternal Grandfather     Social History   Social History  . Marital status: Married    Spouse name: N/A  . Number of children: N/A  . Years of education: N/A   Occupational History  . Not on file.   Social History Main Topics  . Smoking status: Never Smoker  . Smokeless tobacco: Never Used  . Alcohol use 1.2 oz/week    2 Glasses of wine per week  . Drug use: No  . Sexual activity: Yes    Birth control/ protection: Surgical   Other Topics Concern  . Not  on file   Social History Narrative   Married, 3 children.   Educ: BA from [employer]   Occup: homemaker    Outpatient Encounter Prescriptions as of 06/17/2016  Medication Sig  . [DISCONTINUED] pramoxine-hydrocortisone (PROCTOCREAM-HC) 1-1 % rectal cream Place 1 application rectally 2 (two) times daily. (Patient not taking: Reported on 06/17/2016)   No facility-administered encounter medications on file as of 06/17/2016.     Allergies  Allergen Reactions  . Penicillins Hives    ROS Review of Systems  Constitutional: Negative for fatigue and fever.  HENT: Negative for congestion and sore throat.   Eyes: Negative for visual disturbance.  Respiratory: Negative for cough.   Cardiovascular: Negative for chest pain.  Gastrointestinal: Negative for abdominal pain and nausea.  Genitourinary: Negative for dysuria.  Musculoskeletal: Negative for back pain and joint swelling.  Skin: Negative for rash.  Neurological: Negative for weakness and headaches.  Hematological: Negative for adenopathy.    PE; Blood pressure 125/90, pulse 67, temperature 98 F (36.7 C), temperature source Oral, resp. rate 16, height 5\' 8"  (1.727 m), weight 148 lb 8 oz (67.4 kg), last menstrual period 05/28/2016, SpO2 100 %. Pt examined with Sharen Hones, CMA, as chaperone. Gen:  Alert, well appearing.  Patient is oriented to person, place, time, and situation. AFFECT: pleasant, lucid thought and speech. [id number]: no injection, icteris, swelling, or exudate.  EOMI, PERRLA. Mouth: lips without lesion/swelling.  Oral mucosa pink and moist. Oropharynx without erythema, exudate, or swelling.  CV: RRR, no m/r/g.   LUNGS: CTA bilat, nonlabored resps, good aeration in all lung fields. EXT: no clubbing, cyanosis, or edema.  Gluteal region: purplish ecchymoses on upper glut surfaces bilat, minimal tenderness. To the left of midline in superior aspect of left glut there is a 3 cm diameter, fluctuant nodule that is tender. No  warmth, no drainage tract.  No induration.  Coccyx region NONTENDER.  Pertinent labs:  none  ASSESSMENT AND PLAN:   New pt; no old records to obtain.  1) Gluteal/buttocks contusion, with superficial bruising and left sided gluteal hematoma. Reassured, discussed natural course to expect--gradual resolution with the measures she is currently doing. If not significantly improved in 34mo then return for recheck.  An After Visit Summary was printed and given to the patient.  Return if symptoms worsen or fail to improve.  Signed:  Crissie Sickles, MD           06/17/2016

## 2016-11-18 ENCOUNTER — Encounter (HOSPITAL_COMMUNITY): Payer: Self-pay | Admitting: Emergency Medicine

## 2016-11-18 ENCOUNTER — Ambulatory Visit (HOSPITAL_COMMUNITY)
Admission: EM | Admit: 2016-11-18 | Discharge: 2016-11-18 | Disposition: A | Discharge status: Home / Self Care | Payer: Commercial Managed Care - HMO | Attending: Family Medicine | Admitting: Family Medicine

## 2016-11-18 DIAGNOSIS — J020 Streptococcal pharyngitis: Secondary | ICD-10-CM

## 2016-11-18 LAB — POCT RAPID STREP A: STREPTOCOCCUS, GROUP A SCREEN (DIRECT): POSITIVE — AB

## 2016-11-18 MED ORDER — CEPHALEXIN 500 MG PO CAPS
500.0000 mg | ORAL_CAPSULE | Freq: Four times a day (QID) | ORAL | 0 refills | Status: AC
Start: 2016-11-18 — End: 2016-11-28

## 2016-11-18 NOTE — ED Provider Notes (Signed)
CSN: [id number]     Arrival date & time 11/18/16  1140 History   First MD Initiated Contact with Patient 11/18/16 1313     Chief Complaint  Patient presents with  . Sore Throat  . Otalgia   (Consider location/radiation/quality/duration/timing/severity/associated sxs/prior Treatment) 46 year old female presents to clinic with chief complaint of sore throat and right ear pain. Patient is mother of two teenage girls who are both strep positive. Symptoms began Sunday morning, she has no cough, has had fever, no congestion, sinus pain, or pressure, no N/V/D, or other symptoms   The history is provided by the patient.  Sore Throat   Otalgia  Associated symptoms: fever and sore throat   Associated symptoms: no congestion, no ear discharge and no rhinorrhea     Past Medical History:  Diagnosis Date  . Cancer ([place])    melenoma   . Dyspareunia 10/10/2013  . Endometrial polyp 10/17/2013  . Hay fever   . Hemorrhoids 10/10/2013  . Menometrorrhagia 2013   using herbal treatments and this has resolved.  . Other and unspecified ovarian cyst 10/17/2013  . Vaginal itching 10/10/2013   Past Surgical History:  Procedure Laterality Date  . CESAREAN SECTION     3  . ECTOPIC PREGNANCY SURGERY     four surgeries (left fallopian tube removed)  . SKIN CANCER EXCISION  2007   on pt's back    Family History  Problem Relation Age of Onset  . Lung cancer Mother 44    Former smoker  . Mental illness Father   . Alcohol abuse Father   . Hypertension Father   . Heart disease Maternal Grandmother   . Alcohol abuse Maternal Grandmother   . Alcohol abuse Paternal Grandfather   . Alcohol abuse Paternal Grandmother   . Alcohol abuse Maternal Grandfather    Social History  Substance Use Topics  . Smoking status: Never Smoker  . Smokeless tobacco: Never Used  . Alcohol use 1.2 oz/week    2 Glasses of wine per week   OB History    Gravida Para Term Preterm AB Living   9 3     6 3    SAB TAB  Ectopic Multiple Live Births   2   4   3      Review of Systems  Constitutional: Positive for chills and fever. Negative for fatigue.  HENT: Positive for ear pain and sore throat. Negative for congestion, ear discharge, rhinorrhea, sinus pain and sinus pressure.   Eyes: Negative.   Respiratory: Negative.   Gastrointestinal: Negative.   Neurological: Negative.     Allergies  Penicillins and Erythromycin  Home Medications   Prior to Admission medications   Medication Sig Start Date End Date Taking? Authorizing Provider  ibuprofen (ADVIL,MOTRIN) 400 MG tablet Take 400 mg by mouth every 6 (six) hours as needed.   Yes Historical Provider, MD  cephALEXin (KEFLEX) 500 MG capsule Take 1 capsule (500 mg total) by mouth 4 (four) times daily. 11/18/16 11/28/16  Barnet Glasgow, NP   Meds Ordered and Administered this Visit  Medications - No data to display  BP 131/85 (BP Location: Right Arm)   Pulse 77   Temp 98.4 F (36.9 C) (Oral)   Resp 16   LMP 11/11/2016 (Exact Date)   SpO2 100%  No data found.   Physical Exam  Constitutional: She is oriented to person, place, and time. She appears well-developed and well-nourished. No distress.  HENT:  Head: Normocephalic.  Right Ear:  Hearing, tympanic membrane and external ear normal.  Left Ear: Hearing, tympanic membrane and external ear normal.  Mouth/Throat: Oropharyngeal exudate, posterior oropharyngeal edema and posterior oropharyngeal erythema present. Tonsils are 3+ on the right. Tonsils are 3+ on the left.  Eyes: Pupils are equal, round, and reactive to light.  Neck: Normal range of motion. Neck supple. No JVD present.  Cardiovascular: Normal rate and regular rhythm.   Pulmonary/Chest: Effort normal.  Abdominal: Bowel sounds are normal.  Lymphadenopathy:    She has cervical adenopathy.  Neurological: She is alert and oriented to person, place, and time.  Skin: Skin is warm and dry. Capillary refill takes less than 2 seconds. She is  not diaphoretic. No pallor.  Psychiatric: She has a normal mood and affect.  Nursing note and vitals reviewed.   Urgent Care Course   Clinical Course     Procedures (including critical care time)  Labs Review Labs Reviewed  POCT RAPID STREP A - Abnormal; Notable for the following:       Result Value   Streptococcus, Group A Screen (Direct) POSITIVE (*)    All other components within normal limits    Imaging Review No results found.   Visual Acuity Review  Right Eye Distance:   Left Eye Distance:   Bilateral Distance:    Right Eye Near:   Left Eye Near:    Bilateral Near:         MDM   1. Strep pharyngitis    Patient reports allergy to PCN but states she can take Keflex and request a prescription of that medication. She is strep positive and RX for for keflex was written. She may take tylenol for pain and fever as needed. Follow up with PCP if symptoms fail to improve or worsen.    Barnet Glasgow, NP 11/18/16 1424

## 2016-11-18 NOTE — ED Triage Notes (Signed)
The patient presented to the [facility] with a complaint of a sore throat and right ear pain that started yesterday. The patient reported no fever. The patient reported ibuprofen 400 mg at 4:30 am this date.

## 2016-11-18 NOTE — Discharge Instructions (Signed)
Take medicines as prescribed, follow up with your primary care provider should symptoms fail to improve.

## 2017-02-23 ENCOUNTER — Ambulatory Visit (INDEPENDENT_AMBULATORY_CARE_PROVIDER_SITE_OTHER): Payer: Commercial Managed Care - HMO | Admitting: Obstetrics and Gynecology

## 2017-02-23 ENCOUNTER — Other Ambulatory Visit (HOSPITAL_COMMUNITY)
Admission: RE | Admit: 2017-02-23 | Discharge: 2017-02-23 | Disposition: A | Discharge status: Home / Self Care | Payer: Commercial Managed Care - HMO | Source: Ambulatory Visit | Attending: Obstetrics and Gynecology | Admitting: Obstetrics and Gynecology

## 2017-02-23 ENCOUNTER — Encounter: Payer: Self-pay | Admitting: Obstetrics and Gynecology

## 2017-02-23 VITALS — BP 140/100 | HR 96 | Ht 68.75 in | Wt 161.6 lb

## 2017-02-23 DIAGNOSIS — Z01419 Encounter for gynecological examination (general) (routine) without abnormal findings: Secondary | ICD-10-CM | POA: Diagnosis not present

## 2017-02-23 DIAGNOSIS — K644 Residual hemorrhoidal skin tags: Secondary | ICD-10-CM | POA: Diagnosis not present

## 2017-02-23 NOTE — Progress Notes (Signed)
Patient ID: Melissa Harrington, female   DOB: December 11, 1970, 46 y.o.   MRN: [id number]  Assessment:  Annual Gyn Exam  Ext hemorrhoids Plan:  1. pap smear done, next pap due in 5 years  2. return annually or prn 3    Annual mammogram and regular self exams advised 4 pt to do hemoccults 5  Refer to Dr Arnoldo Morale re: hemorrhoids. Subjective:   Chief Complaint  Patient presents with  . Gynecologic Exam    discuss hemorrhoids     Melissa Harrington is a 46 y.o. female [phone] who presents for annual exam. Patient's last menstrual period was 02/02/2017 (approximate). The patient has no complaints today. Pt states her BP was also elevated in 11/2016 while she had strep throat. She does not check her BP at home so she is not sure if this is a trend.   The following portions of the patient's history were reviewed and updated as appropriate: allergies, current medications, past family history, past medical history, past social history, past surgical history and problem list. Past Medical History:  Diagnosis Date  . Cancer ([place])    melenoma   . Dyspareunia 10/10/2013  . Endometrial polyp 10/17/2013  . Hay fever   . Hemorrhoids 10/10/2013  . Menometrorrhagia 2013   using herbal treatments and this has resolved.  . Other and unspecified ovarian cyst 10/17/2013  . Vaginal itching 10/10/2013    Past Surgical History:  Procedure Laterality Date  . CESAREAN SECTION     3  . ECTOPIC PREGNANCY SURGERY     four surgeries (left fallopian tube removed)  . SKIN CANCER EXCISION  2007   on pt's back      Current Outpatient Prescriptions:  .  ibuprofen (ADVIL,MOTRIN) 400 MG tablet, Take 400 mg by mouth every 6 (six) hours as needed., Disp: , Rfl:   Review of Systems Constitutional: negative Gastrointestinal: negative Genitourinary: negative   Objective:  BP (!) 140/100 (BP Location: Right Arm, Patient Position: Sitting, Cuff Size: Normal)   Pulse 96   Ht 5' 8.75" (1.746 m)   Wt 161 lb 9.6 oz (73.3  kg)   LMP 02/02/2017 (Approximate)   BMI 24.04 kg/m    BMI: Body mass index is 24.04 kg/m.  General Appearance: Alert, appropriate appearance for age. No acute distress HEENT: Grossly normal Neck / Thyroid:  Cardiovascular: RRR; normal S1, S2, no murmur Lungs: CTA bilaterally Back: No CVAT Breast Exam: No masses or nodes.No dimpling, nipple retraction or discharge. Even tissues.  Gastrointestinal: Soft, non-tender, no masses or organomegaly Pelvic Exam:  External genitalia: normal general appearance Vaginal: normal mucosa without prolapse or lesions Cervix: normal appearance Adnexa: normal bimanual exam Uterus: normal single, nontender, tiny  Rectovaginal: not indicated, pt irritated and would be false +; pt provided with stool cards  Lymphatic Exam: Non-palpable nodes in neck, clavicular, axillary, or inguinal regions  Skin: no rash or abnormalities Neurologic: Normal gait and speech, no tremor  Psychiatric: Alert and oriented, appropriate affect.  Urinalysis:Not done  Mallory Shirk. MD Pgr [phone] 2:17 PM   By signing my name below, I, Melissa Harrington, attest that this documentation has been prepared under the direction and in the presence of Jonnie Kind, MD. Electronically Signed: Hansel Harrington, ED Scribe. 02/23/17. 2:13 PM.  I personally performed the services described in this documentation, which was SCRIBED in my presence. The recorded information has been reviewed and considered accurate. It has been edited as necessary during review. Jonnie Kind, MD

## 2017-02-25 LAB — CYTOLOGY - PAP
ADEQUACY: ABSENT
DIAGNOSIS: NEGATIVE
HPV (WINDOPATH): NOT DETECTED

## 2017-02-27 ENCOUNTER — Encounter: Payer: Self-pay | Admitting: Family Medicine

## 2017-03-06 DIAGNOSIS — Z01419 Encounter for gynecological examination (general) (routine) without abnormal findings: Secondary | ICD-10-CM | POA: Diagnosis not present

## 2017-03-07 LAB — COMPREHENSIVE METABOLIC PANEL
A/G RATIO: 1.5 (ref 1.2–2.2)
ALBUMIN: 4.4 g/dL (ref 3.5–5.5)
ALK PHOS: 50 IU/L (ref 39–117)
ALT: 12 IU/L (ref 0–32)
AST: 19 IU/L (ref 0–40)
BILIRUBIN TOTAL: 0.6 mg/dL (ref 0.0–1.2)
BUN / CREAT RATIO: 13 (ref 9–23)
BUN: 11 mg/dL (ref 6–24)
CHLORIDE: 97 mmol/L (ref 96–106)
CO2: 24 mmol/L (ref 18–29)
CREATININE: 0.86 mg/dL (ref 0.57–1.00)
Calcium: 9.4 mg/dL (ref 8.7–10.2)
GFR calc Af Amer: 94 mL/min/{1.73_m2} (ref >59)
GFR calc non Af Amer: 81 mL/min/{1.73_m2} (ref >59)
GLOBULIN, TOTAL: 3 g/dL (ref 1.5–4.5)
GLUCOSE: 138 mg/dL — AB (ref 65–99)
POTASSIUM: 4.6 mmol/L (ref 3.5–5.2)
SODIUM: 134 mmol/L (ref 134–144)
Total Protein: 7.4 g/dL (ref 6.0–8.5)

## 2017-03-07 LAB — CBC
HEMATOCRIT: 44.7 % (ref 34.0–46.6)
Hemoglobin: 14.9 g/dL (ref 11.1–15.9)
MCH: 30.5 pg (ref 26.6–33.0)
MCHC: 33.3 g/dL (ref 31.5–35.7)
MCV: 91 fL (ref 79–97)
Platelets: 194 10*3/uL (ref 150–379)
RBC: 4.89 x10E6/uL (ref 3.77–5.28)
RDW: 14 % (ref 12.3–15.4)
WBC: 5.8 10*3/uL (ref 3.4–10.8)

## 2017-03-07 LAB — LIPID PANEL
CHOLESTEROL TOTAL: 196 mg/dL (ref 100–199)
Chol/HDL Ratio: 2.3 ratio (ref 0.0–4.4)
HDL: 86 mg/dL (ref >39)
LDL CALC: 88 mg/dL (ref 0–99)
TRIGLYCERIDES: 108 mg/dL (ref 0–149)
VLDL Cholesterol Cal: 22 mg/dL (ref 5–40)

## 2017-03-07 LAB — TSH: TSH: 2.83 u[IU]/mL (ref 0.450–4.500)

## 2017-03-09 ENCOUNTER — Ambulatory Visit (INDEPENDENT_AMBULATORY_CARE_PROVIDER_SITE_OTHER): Payer: Commercial Managed Care - HMO | Admitting: Family Medicine

## 2017-03-09 ENCOUNTER — Encounter: Payer: Self-pay | Admitting: Family Medicine

## 2017-03-09 ENCOUNTER — Telehealth: Payer: Self-pay | Admitting: *Deleted

## 2017-03-09 VITALS — BP 154/84 | HR 76 | Temp 98.3°F | Resp 16 | Ht 68.75 in | Wt 162.5 lb

## 2017-03-09 DIAGNOSIS — L03011 Cellulitis of right finger: Secondary | ICD-10-CM | POA: Diagnosis not present

## 2017-03-09 MED ORDER — CEPHALEXIN 500 MG PO CAPS: 500.0000 mg | ORAL_CAPSULE | Freq: Three times a day (TID) | ORAL | 0 refills | Status: DC

## 2017-03-09 NOTE — Progress Notes (Signed)
OFFICE VISIT  03/09/2017   CC:  Chief Complaint  Patient presents with  . Hand Pain    4th digit right hand x 4-5 days   HPI:    Patient is a 46 y.o. Caucasian female who presents for concern of infection in her right ring finger. Started after she bit cuticle.  Pain at nail periphery, then whitish-lucent rim of nail fold present, pain worsened intensely to the point that she sterilized a needle at home and lanced the area herself. This brought some immediate relief/drainage of clear-whitish, thin fluid.  Then after about a day, it started to hurt again, she noted redness extending from nail fold to DIP area, and noted mild swelling in the area. She had no fever or malaise.  She took some of her daughter's old keflex: 500 mg bid x 2d and has noted signif improvement.  She has been soaking it in epsom salt 20 min bid and applying bactroban ointment. Swelling is gone, just some mild pain in lateral aspect of nail fold.   Past Medical History:  Diagnosis Date  . Anxiety    + hx of panic attacks.  Was on lexapro for a short time in remote past.  . Dyspareunia 10/10/2013  . Endometrial polyp 10/17/2013  . Hay fever   . Hemorrhoids 10/10/2013  . History of melanoma   . Menometrorrhagia 2013   using herbal treatments and this has resolved.  . Other and unspecified ovarian cyst 10/17/2013    Past Surgical History:  Procedure Laterality Date  . CESAREAN SECTION     3  . ECTOPIC PREGNANCY SURGERY     four surgeries (left fallopian tube removed)  . SKIN CANCER EXCISION  2007   on pt's back     Outpatient Medications Prior to Visit  Medication Sig Dispense Refill  . ibuprofen (ADVIL,MOTRIN) 400 MG tablet Take 400 mg by mouth every 6 (six) hours as needed.     No facility-administered medications prior to visit.     Allergies  Allergen Reactions  . Penicillins Hives  . Erythromycin Rash    ROS As per HPI  PE: Blood pressure (!) 154/84, pulse 76, temperature 98.3 F (36.8  C), temperature source Oral, resp. rate 16, height 5' 8.75" (1.746 m), weight 162 lb 8 oz (73.7 kg), last menstrual period 03/08/2017, SpO2 100 %. Gen: Alert, well appearing.  Patient is oriented to person, place, time, and situation. AFFECT: pleasant, lucid thought and speech. R hand 4th finger with violaceous hue starting at nail fold and extending on extensor surface only--to the PIP joint region.  No swelling, no tenderness.  Nail appears normal.  ROM of finger intact.  No warmth.  No streaking.  LABS:  none  IMPRESSION AND PLAN:  Paronychia, infected--R hand 4th digit. Improving s/p pt's self-lancing and 2d of keflex 500 bid + regular epsom salt soaks. Continue soaks and I'll treat with 5 more days of keflex 500 mg tid.  An After Visit Summary was printed and given to the patient.  FOLLOW UP: Return if symptoms worsen or fail to improve.  Signed:  Crissie Sickles, MD           03/09/2017

## 2017-03-09 NOTE — Telephone Encounter (Signed)
Informed patient of normal labs. Pt states she did have a cup of coffee with sugar before test.

## 2017-03-09 NOTE — Progress Notes (Signed)
Pre visit review using our clinic review tool, if applicable. No additional management support is needed unless otherwise documented below in the visit note. 

## 2018-04-29 ENCOUNTER — Ambulatory Visit: Payer: 59 | Admitting: Family Medicine

## 2018-04-29 ENCOUNTER — Encounter: Payer: Self-pay | Admitting: Family Medicine

## 2018-04-29 VITALS — BP 128/90 | HR 80 | Temp 98.7°F | Resp 16 | Ht 68.75 in | Wt 161.5 lb

## 2018-04-29 DIAGNOSIS — J0100 Acute maxillary sinusitis, unspecified: Secondary | ICD-10-CM | POA: Diagnosis not present

## 2018-04-29 DIAGNOSIS — J209 Acute bronchitis, unspecified: Secondary | ICD-10-CM | POA: Diagnosis not present

## 2018-04-29 MED ORDER — ALBUTEROL SULFATE HFA 108 (90 BASE) MCG/ACT IN AERS: 2.0000 | INHALATION_SPRAY | Freq: Four times a day (QID) | RESPIRATORY_TRACT | 0 refills | Status: DC | PRN

## 2018-04-29 MED ORDER — PREDNISONE 20 MG PO TABS: ORAL_TABLET | ORAL | 0 refills | Status: DC

## 2018-04-29 MED ORDER — CEFDINIR 300 MG PO CAPS: 300.0000 mg | ORAL_CAPSULE | Freq: Two times a day (BID) | ORAL | 0 refills | Status: DC

## 2018-04-29 NOTE — Patient Instructions (Signed)
Get otc generic robitussin DM OR Mucinex DM and use as directed on the packaging for cough and congestion. Use otc generic saline nasal spray 2-3 times per day to irrigate/moisturize your nasal passages.  Rest! Hydrate!  Hope you get better soon.

## 2018-04-29 NOTE — Progress Notes (Signed)
OFFICE VISIT  04/29/2018   CC:  Chief Complaint  Patient presents with  . Cough   HPI:    Patient is a 47 y.o. Caucasian female who presents for cough. Got stomach bug x 1 week towards end of May. Then started coughing/hacking 04/17/18, initially with some 101 temp for a few days.  Mild HA, nasal congestion/runny nose. Some rattle in chest that seems to not want to move.  Cough worse in mornings and night. Mucinex DM, neti pot, essential oils, and ibup have been minimally helpful.  Poor energy, loss of appetite. No facial pain.  Some wheezing last week but that went away. Sick contacts prior to onset of her sx's with same sx's. No SOB or CP.  Past Medical History:  Diagnosis Date  . Anxiety    + hx of panic attacks.  Was on lexapro for a short time in remote past.  . Dyspareunia 10/10/2013  . Endometrial polyp 10/17/2013  . Hay fever   . Hemorrhoids 10/10/2013  . History of melanoma   . Menometrorrhagia 2013   using herbal treatments and this has resolved.  . Other and unspecified ovarian cyst 10/17/2013    Past Surgical History:  Procedure Laterality Date  . CESAREAN SECTION     3  . ECTOPIC PREGNANCY SURGERY     four surgeries (left fallopian tube removed)  . SKIN CANCER EXCISION  2007   on pt's back    Social History   Socioeconomic History  . Marital status: Married    Spouse name: Not on file  . Number of children: Not on file  . Years of education: Not on file  . Highest education level: Not on file  Occupational History  . Not on file  Social Needs  . Financial resource strain: Not on file  . Food insecurity:    Worry: Not on file    Inability: Not on file  . Transportation needs:    Medical: Not on file    Non-medical: Not on file  Tobacco Use  . Smoking status: Never Smoker  . Smokeless tobacco: Never Used  Substance and Sexual Activity  . Alcohol use: Yes    Alcohol/week: 1.2 oz    Types: 2 Glasses of wine per week    Comment: occ  . Drug use:  No  . Sexual activity: Yes    Birth control/protection: Surgical  Lifestyle  . Physical activity:    Days per week: Not on file    Minutes per session: Not on file  . Stress: Not on file  Relationships  . Social connections:    Talks on phone: Not on file    Gets together: Not on file    Attends religious service: Not on file    Active member of club or organization: Not on file    Attends meetings of clubs or organizations: Not on file    Relationship status: Not on file  Other Topics Concern  . Not on file  Social History Narrative   Married, 3 children.   Educ: BA from [employer]   Occup: homemaker    Outpatient Medications Prior to Visit  Medication Sig Dispense Refill  . ibuprofen (ADVIL,MOTRIN) 400 MG tablet Take 400 mg by mouth every 6 (six) hours as needed.    . cephALEXin (KEFLEX) 500 MG capsule Take 1 capsule (500 mg total) by mouth 3 (three) times daily. (Patient not taking: Reported on 04/29/2018) 15 capsule 0   No facility-administered medications prior to  visit.     Allergies  Allergen Reactions  . Penicillins Hives  . Erythromycin Rash    ROS As per HPI  PE: Blood pressure 128/90, pulse 80, temperature 98.7 F (37.1 C), temperature source Oral, resp. rate 16, height 5' 8.75" (1.746 m), weight 161 lb 8 oz (73.3 kg), SpO2 100 %. Body mass index is 24.02 kg/m.  VS: noted--normal. Gen: alert, NAD, NONTOXIC APPEARING. HEENT: eyes without injection, drainage, or swelling.  Ears: EACs clear, TMs with normal light reflex and landmarks.  Nose: Clear rhinorrhea, with some dried, crusty exudate adherent to mildly injected mucosa.  No purulent d/c.  No paranasal sinus TTP.  No facial swelling.  Throat and mouth without focal lesion.  No pharyngial swelling, erythema, or exudate.   Neck: supple, no LAD.   LUNGS: CTA bilat, nonlabored resps.   Significant nonproductive cough with forced exp maneuver.  Good aeration.  CV: RRR, no m/r/g. EXT: no c/c/e SKIN: no  rash  LABS:    Chemistry      Component Value Date/Time   NA 134 03/06/2017 0826   K 4.6 03/06/2017 0826   CL 97 03/06/2017 0826   CO2 24 03/06/2017 0826   BUN 11 03/06/2017 0826   CREATININE 0.86 03/06/2017 0826      Component Value Date/Time   CALCIUM 9.4 03/06/2017 0826   ALKPHOS 50 03/06/2017 0826   AST 19 03/06/2017 0826   ALT 12 03/06/2017 0826   BILITOT 0.6 03/06/2017 0826      IMPRESSION AND PLAN:  Prolonged resp illness: suspect acute bacterial sinusitis and bronchitis. Prednisone 20mg  qd x 5d (pt preferred to not be aggressive with steroid dosing). Cefdinir 300 mg bid x 10d (pt has pen allergy but has tolerated keflex in the past w/out problem). Albuterol HFA, 1-2 p q6h prn. Inhaler education done by CMA Helayne [place] today. Rest, hydrate. Get otc generic robitussin DM OR Mucinex DM and use as directed on the packaging for cough and congestion. Use otc generic saline nasal spray 2-3 times per day to irrigate/moisturize your nasal passages.  An After Visit Summary was printed and given to the patient.  FOLLOW UP: Return if symptoms worsen or fail to improve.  Signed:  Crissie Sickles, MD           04/29/2018

## 2019-03-03 ENCOUNTER — Other Ambulatory Visit: Payer: Self-pay

## 2019-03-03 ENCOUNTER — Ambulatory Visit (INDEPENDENT_AMBULATORY_CARE_PROVIDER_SITE_OTHER): Payer: 59 | Admitting: Family Medicine

## 2019-03-03 ENCOUNTER — Encounter: Payer: Self-pay | Admitting: Family Medicine

## 2019-03-03 VITALS — Ht 68.5 in | Wt 170.0 lb

## 2019-03-03 DIAGNOSIS — F4321 Adjustment disorder with depressed mood: Secondary | ICD-10-CM

## 2019-03-03 DIAGNOSIS — F5104 Psychophysiologic insomnia: Secondary | ICD-10-CM | POA: Diagnosis not present

## 2019-03-03 DIAGNOSIS — F419 Anxiety disorder, unspecified: Secondary | ICD-10-CM | POA: Diagnosis not present

## 2019-03-03 MED ORDER — ALPRAZOLAM 0.5 MG PO TABS: ORAL_TABLET | ORAL | 1 refills | Status: DC

## 2019-03-03 NOTE — Progress Notes (Signed)
Virtual Visit via Video Note  I connected with pt  on 03/03/19 at  2:20 PM EDT by a video enabled telemedicine application and verified that I am speaking with the correct person using two identifiers.  Location patient: home Location provider:work or home office Persons participating in the virtual visit: patient, provider  I discussed the limitations of evaluation and management by telemedicine and the availability of in person appointments. The patient expressed understanding and agreed to proceed.  Telemedicine visit is a necessity given the COVID-19 restrictions in place at the current time.  HPI: 48 y/o WF who I'm seeing today for depression/grieving/anxiety. Her mother died of lung cancer last week. She is sad, as expected.  Periods of acute worsening, as expected. More anxiety lately, esp since there seems to be some other family dynamics playing out. She has trouble sleeping most nights, had one period of some SOB and palpitations with mild dizziness that did not turn into a full blown panic attack. She has had one similar period in the remote past (at the time of her mom's first dx of cancer), responded to low dose xanax prn--only had to take it for a brief period of time. Has never had GAD or an episode of MDD.  Has never been on antidepressant.  No otc sleep aids have been tried. She is trying to walk some for exercise. No SI or HI.  Eating and drinking fine.    ROS: See pertinent positives and negatives per HPI.  Past Medical History:  Diagnosis Date  . Anxiety    + hx of panic attacks.  Was on lexapro for a short time in remote past.  . Dyspareunia 10/10/2013  . Endometrial polyp 10/17/2013  . Hay fever   . Hemorrhoids 10/10/2013  . History of melanoma   . Menometrorrhagia 2013   using herbal treatments and this has resolved.  . Other and unspecified ovarian cyst 10/17/2013    Past Surgical History:  Procedure Laterality Date  . CESAREAN SECTION     3  .  ECTOPIC PREGNANCY SURGERY     four surgeries (left fallopian tube removed)  . SKIN CANCER EXCISION  2007   on pt's back     Family History  Problem Relation Age of Onset  . Lung cancer Mother 45       Former smoker  . Mental illness Father   . Alcohol abuse Father   . Hypertension Father   . Heart disease Maternal Grandmother   . Alcohol abuse Maternal Grandmother   . Alcohol abuse Paternal Grandfather   . Alcohol abuse Paternal Grandmother   . Alcohol abuse Maternal Grandfather      Current Outpatient Medications:  .  ibuprofen (ADVIL,MOTRIN) 400 MG tablet, Take 400 mg by mouth every 6 (six) hours as needed., Disp: , Rfl:   EXAM:  VITALS per patient if applicable:  GENERAL: alert, oriented, appears well and in no acute distress  HEENT: atraumatic, conjunttiva clear, no obvious abnormalities on inspection of external nose and ears  NECK: normal movements of the head and neck  LUNGS: on inspection no signs of respiratory distress, breathing rate appears normal, no obvious gross SOB, gasping or wheezing  CV: no obvious cyanosis  MS: moves all visible extremities without noticeable abnormality  PSYCH/NEURO: pleasant and cooperative, no obvious depression or anxiety, speech and thought processing grossly intact  ASSESSMENT AND PLAN:  Discussed the following assessment and plan:  1) Grief reaction, with particular increase in periods of anxiety  and anxiety-induced insomnia. Gave emotional encouragement, encouraged exercise, encouraged pt to express her emotions and thoughts with others, etc. Will rx alprazolam 0.5mg , 1 tab bid prn, #60, RF x 1. Therapeutic expectations and side effect profile of medication discussed today.  Patient's questions answered.    I discussed the assessment and treatment plan with the patient. The patient was provided an opportunity to ask questions and all were answered. The patient agreed with the plan and demonstrated an understanding of the  instructions.   The patient was advised to call back or seek an in-person evaluation if the symptoms worsen or if the condition fails to improve as anticipated.  F/u: 1 mo phone f/u.  Signed:  Crissie Sickles, MD           03/03/2019

## 2019-03-11 ENCOUNTER — Telehealth: Payer: Self-pay

## 2019-03-11 NOTE — Telephone Encounter (Signed)
FYI: SW pt this morning regarding BP and menopause, she wanted to inform PCP she has only had only 1 menstrual within 6 month time frame. Her BP has been running high and was 170/110 this past Tuesday.  Please advise if any suggestions, thanks.   Copied from [place] [phone]. Topic: General - Inquiry >> Mar 10, 2019  4:11 PM Rainey Pines A wrote: Reason for QAE:SLPNPYY wants to speak with Dr. Anitra Lauth or Dr. Anitra Lauth nurse in regards to blood pressure and menopause.  Patient stated that the sound went out during her visit on 03/03/2019.

## 2019-03-11 NOTE — Telephone Encounter (Signed)
Needs virtual visit.-thx

## 2019-03-15 NOTE — Telephone Encounter (Signed)
Pt was called and scheduled !

## 2019-03-18 ENCOUNTER — Encounter: Payer: Self-pay | Admitting: Family Medicine

## 2019-03-18 ENCOUNTER — Ambulatory Visit (INDEPENDENT_AMBULATORY_CARE_PROVIDER_SITE_OTHER): Payer: 59 | Admitting: Family Medicine

## 2019-03-18 VITALS — BP 158/98 | HR 74

## 2019-03-18 DIAGNOSIS — I10 Essential (primary) hypertension: Secondary | ICD-10-CM

## 2019-03-18 DIAGNOSIS — Z91018 Allergy to other foods: Secondary | ICD-10-CM

## 2019-03-18 HISTORY — DX: Allergy to other foods: Z91.018

## 2019-03-18 MED ORDER — AMLODIPINE BESY-BENAZEPRIL HCL 5-10 MG PO CAPS: 1.0000 | ORAL_CAPSULE | Freq: Every day | ORAL | 0 refills | Status: DC

## 2019-03-18 NOTE — Progress Notes (Signed)
Virtual Visit via Video Note  I connected with pt on 03/18/19 at  9:20 AM EDT by a video enabled telemedicine application and verified that I am speaking with the correct person using two identifiers.  Location patient: home Location provider:work or home office Persons participating in the virtual visit: patient, provider  I discussed the limitations of evaluation and management by telemedicine and the availability of in person appointments. The patient expressed understanding and agreed to proceed.  Telemedicine visit is a necessity given the COVID-19 restrictions in place at the current time.  HPI: 48 y/o WF being seen for recent elevated blood pressures and concern that she may be starting menopause. Hx of elevated bp's during pregnancies.  Recently has noted bp's markedly high: up to 604V systolic, diast 409 highest. Avg: 170/110 the last 2 wks. Stress increased lately, ? Hormones, poorer diet (increased Na, carbs, fat) last few months.  No exercise lately.  No HAs, no vision c/o's, no SOB, no CP. No tremulousness or palpitations. She just started a 5d organ detox yesterday (dietary). No NSAIDs. She has not had menstrual period in 6 mo.  No abnl bleeding. No hot flashes.  Review of past bp readings in EMR: 118-154 syst, 70-100 diast, HR 67-96.   ROS: See pertinent positives and negatives per HPI.  Past Medical History:  Diagnosis Date  . Anxiety    + hx of panic attacks.  Was on lexapro for a short time in remote past.  . Dyspareunia 10/10/2013  . Endometrial polyp 10/17/2013  . Hay fever   . Hemorrhoids 10/10/2013  . History of melanoma   . Menometrorrhagia 2013   using herbal treatments and this has resolved.  . Other and unspecified ovarian cyst 10/17/2013    Past Surgical History:  Procedure Laterality Date  . CESAREAN SECTION     3  . ECTOPIC PREGNANCY SURGERY     four surgeries (left fallopian tube removed)  . SKIN CANCER EXCISION  2007   on pt's back      Family History  Problem Relation Age of Onset  . Lung cancer Mother 74       Former smoker  . Mental illness Father   . Alcohol abuse Father   . Hypertension Father   . Heart disease Maternal Grandmother   . Alcohol abuse Maternal Grandmother   . Alcohol abuse Paternal Grandfather   . Alcohol abuse Paternal Grandmother   . Alcohol abuse Maternal Grandfather     SOCIAL HX: patient's mother died of Lung cancer about 1 month ago (01/2019).   Current Outpatient Medications:  .  ALPRAZolam (XANAX) 0.5 MG tablet, 1 tab po bid prn anxiety, Disp: 60 tablet, Rfl: 1 .  ibuprofen (ADVIL,MOTRIN) 400 MG tablet, Take 400 mg by mouth every 6 (six) hours as needed., Disp: , Rfl:   EXAM:  VITALS per patient if applicable: BP (!) 811/91 (BP Location: Left Arm, Patient Position: Sitting, Cuff Size: Normal)   Pulse 74    GENERAL: alert, oriented, appears well and in no acute distress  HEENT: atraumatic, conjunttiva clear, no obvious abnormalities on inspection of external nose and ears  NECK: normal movements of the head and neck  LUNGS: on inspection no signs of respiratory distress, breathing rate appears normal, no obvious gross SOB, gasping or wheezing  CV: no obvious cyanosis  MS: moves all visible extremities without noticeable abnormality  PSYCH/NEURO: pleasant and cooperative, no obvious depression or anxiety, speech and thought processing grossly intact  LABS: none  today    Chemistry      Component Value Date/Time   NA 134 03/06/2017 0826   K 4.6 03/06/2017 0826   CL 97 03/06/2017 0826   CO2 24 03/06/2017 0826   BUN 11 03/06/2017 0826   CREATININE 0.86 03/06/2017 0826      Component Value Date/Time   CALCIUM 9.4 03/06/2017 0826   ALKPHOS 50 03/06/2017 0826   AST 19 03/06/2017 0826   ALT 12 03/06/2017 0826   BILITOT 0.6 03/06/2017 0826     Lab Results  Component Value Date   TSH 2.830 03/06/2017   Lab Results  Component Value Date   CHOL 196 03/06/2017    HDL 86 03/06/2017   LDLCALC 88 03/06/2017   TRIG 108 03/06/2017   CHOLHDL 2.3 03/06/2017    ASSESSMENT AND PLAN:  Discussed the following assessment and plan:  New dx HTN: She has likely had this for a few years, has been "ignoring it" per her words--in order to dedicate time/efforts to taking care of her ill mother.  Start amlo/benaz 5/10 mg, 1 qd. Monitor bp/hr twice a day. Fasting lab visit ASAP (health panel). Goal bp <130/80. Low Na diet, increase exercise.   I discussed the assessment and treatment plan with the patient. The patient was provided an opportunity to ask questions and all were answered. The patient agreed with the plan and demonstrated an understanding of the instructions.   The patient was advised to call back or seek an in-person evaluation if the symptoms worsen or if the condition fails to improve as anticipated.  F/u: 7-10d f/u HTN  Signed:  Crissie Sickles, MD           03/18/2019

## 2019-04-04 ENCOUNTER — Encounter: Payer: Self-pay | Admitting: Family Medicine

## 2019-04-04 ENCOUNTER — Ambulatory Visit (INDEPENDENT_AMBULATORY_CARE_PROVIDER_SITE_OTHER): Payer: 59 | Admitting: Family Medicine

## 2019-04-04 VITALS — BP 143/92 | HR 78 | Wt 173.0 lb

## 2019-04-04 DIAGNOSIS — R109 Unspecified abdominal pain: Secondary | ICD-10-CM | POA: Diagnosis not present

## 2019-04-04 DIAGNOSIS — Z91018 Allergy to other foods: Secondary | ICD-10-CM | POA: Diagnosis not present

## 2019-04-04 DIAGNOSIS — I10 Essential (primary) hypertension: Secondary | ICD-10-CM | POA: Diagnosis not present

## 2019-04-04 DIAGNOSIS — F4321 Adjustment disorder with depressed mood: Secondary | ICD-10-CM

## 2019-04-04 NOTE — Progress Notes (Signed)
Virtual Visit via Video Note  I connected with pt on 04/04/19 at  9:30 AM EDT by video assisted telemedicine platform and verified that I am speaking with the correct person using two identifiers.  Location patient: home Location provider:work or home office Persons participating in the virtual visit: patient, provider  I discussed the limitations of evaluation and management by telemedicine/telephone and the availability of in person appointments. The patient expressed understanding and agreed to proceed.  Telemedicine/telephone visit is a necessity given the COVID-19 restrictions in place at the current time.  HPI: 48 y/o WF being seen today today (due to COVID-19 pandemic restrictions) forf/u grief reaction and HTN.  Grief: started alpraz prn 1 mo ago for periods of feeling excessively stressed out and overwhelmed---her mother died of lung ca a little over a month ago. She had been taking the xanax daily until a couple days ago, now has been 2d w/out xanax and is feeling stable.  HTN: started lotrel 5/10 qd about 2 wks ago.  She did not start the bp med as instructed. Super greens, beet root powder, walking 2 miles per day.   BP has been 145/low 90s.    Has had 2 wks of mod/severe GI rxn to pork and beef --bad stomach ache, n/v, diarrhea, recently got rash on torso (reddish striations, no hives, not itchy).  Each rxn has been worse than the one before. She had 4 tick bites about 10-14 d prior to her GI complaints.  Currently feels well.  ROS: no CP, no SOB, no wheezing, no cough, no dizziness, no HAs, no melena/hematochezia.  No polyuria or polydipsia.  No myalgias or arthralgias.   Past Medical History:  Diagnosis Date  . Anxiety    + hx of panic attacks.  Was on lexapro for a short time in remote past.  . Dyspareunia 10/10/2013  . Endometrial polyp 10/17/2013  . Hay fever   . Hemorrhoids 10/10/2013  . History of melanoma   . Menometrorrhagia 2013   using herbal treatments and  this has resolved.  . Other and unspecified ovarian cyst 10/17/2013    Past Surgical History:  Procedure Laterality Date  . CESAREAN SECTION     3  . ECTOPIC PREGNANCY SURGERY     four surgeries (left fallopian tube removed)  . SKIN CANCER EXCISION  2007   on pt's back     Family History  Problem Relation Age of Onset  . Lung cancer Mother 28       Former smoker  . Mental illness Father   . Alcohol abuse Father   . Hypertension Father   . Heart disease Maternal Grandmother   . Alcohol abuse Maternal Grandmother   . Alcohol abuse Paternal Grandfather   . Alcohol abuse Paternal Grandmother   . Alcohol abuse Maternal Grandfather      Current Outpatient Medications:  .  ALPRAZolam (XANAX) 0.5 MG tablet, 1 tab po bid prn anxiety, Disp: 60 tablet, Rfl: 1 .  amLODipine-benazepril (LOTREL) 5-10 MG capsule, Take 1 capsule by mouth daily., Disp: 30 capsule, Rfl: 0 .  ibuprofen (ADVIL,MOTRIN) 400 MG tablet, Take 400 mg by mouth every 6 (six) hours as needed., Disp: , Rfl:   EXAM:  VITALS per patient if applicable:  GENERAL: alert, oriented, appears well and in no acute distress  HEENT: atraumatic, conjunttiva clear, no obvious abnormalities on inspection of external nose and ears  NECK: normal movements of the head and neck  LUNGS: on inspection no signs of  respiratory distress, breathing rate appears normal, no obvious gross SOB, gasping or wheezing  CV: no obvious cyanosis  MS: moves all visible extremities without noticeable abnormality  PSYCH/NEURO: pleasant and cooperative, no obvious depression or anxiety, speech and thought processing grossly intact    Chemistry      Component Value Date/Time   NA 134 03/06/2017 0826   K 4.6 03/06/2017 0826   CL 97 03/06/2017 0826   CO2 24 03/06/2017 0826   BUN 11 03/06/2017 0826   CREATININE 0.86 03/06/2017 0826      Component Value Date/Time   CALCIUM 9.4 03/06/2017 0826   ALKPHOS 50 03/06/2017 0826   AST 19 03/06/2017  0826   ALT 12 03/06/2017 0826   BILITOT 0.6 03/06/2017 0826      ASSESSMENT AND PLAN:  Discussed the following assessment and plan:  1) HTN; her bp's have improved some with her homeopathic measures and improved exercise and diet lately.  She wants to stay off of meds if at all possible. She understands that her bp goal is <130/80 consistently and if it does not reach this goal in the next 2 wks we'll start the med we intended initially: lotrel 5/10 qd.  2) Grief rxn/anxiety: she is gradually improving as appropriate.  She'll continue to use the xanax on a prn basis and will very likely not need this long term at all.  3) GI reaction to meat (beef and pork): occurred after getting tick bites recentlly.  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  She'll avoid meat at this time and I'll refer her to Dr. Verlin Fester with [facility].     I discussed the assessment and treatment plan with the patient. The patient was provided an opportunity to ask questions and all were answered. The patient agreed with the plan and demonstrated an understanding of the instructions.   The patient was advised to call back or seek an in-person evaluation if the symptoms worsen or if the condition fails to improve as anticipated.  F/u: 2 wks  Signed:  Crissie Sickles, MD           04/04/2019

## 2019-04-12 ENCOUNTER — Other Ambulatory Visit: Payer: Self-pay

## 2019-04-12 ENCOUNTER — Encounter: Payer: Self-pay | Admitting: Allergy and Immunology

## 2019-04-12 ENCOUNTER — Ambulatory Visit (INDEPENDENT_AMBULATORY_CARE_PROVIDER_SITE_OTHER): Payer: 59 | Admitting: Allergy and Immunology

## 2019-04-12 VITALS — BP 136/100 | HR 83 | Temp 98.3°F | Resp 16 | Ht 68.0 in | Wt 176.8 lb

## 2019-04-12 DIAGNOSIS — H1010 Acute atopic conjunctivitis, unspecified eye: Secondary | ICD-10-CM | POA: Insufficient documentation

## 2019-04-12 DIAGNOSIS — J3089 Other allergic rhinitis: Secondary | ICD-10-CM

## 2019-04-12 DIAGNOSIS — T7840XD Allergy, unspecified, subsequent encounter: Secondary | ICD-10-CM

## 2019-04-12 DIAGNOSIS — H1013 Acute atopic conjunctivitis, bilateral: Secondary | ICD-10-CM | POA: Diagnosis not present

## 2019-04-12 DIAGNOSIS — T7800XD Anaphylactic reaction due to unspecified food, subsequent encounter: Secondary | ICD-10-CM | POA: Diagnosis not present

## 2019-04-12 DIAGNOSIS — R030 Elevated blood-pressure reading, without diagnosis of hypertension: Secondary | ICD-10-CM | POA: Insufficient documentation

## 2019-04-12 DIAGNOSIS — T7840XA Allergy, unspecified, initial encounter: Secondary | ICD-10-CM | POA: Insufficient documentation

## 2019-04-12 MED ORDER — OLOPATADINE HCL 0.2 % OP SOLN: 1.0000 [drp] | Freq: Every day | OPHTHALMIC | 3 refills | Status: DC | PRN

## 2019-04-12 MED ORDER — FLUTICASONE PROPIONATE 50 MCG/ACT NA SUSP: 2.0000 | Freq: Every day | NASAL | 3 refills | Status: DC | PRN

## 2019-04-12 MED ORDER — EPINEPHRINE 0.3 MG/0.3ML IJ SOAJ: 0.3000 mg | INTRAMUSCULAR | 2 refills | Status: AC | PRN

## 2019-04-12 NOTE — Assessment & Plan Note (Signed)
   The patient has been made aware of the elevated blood pressure reading.  She notes that she is "trying natural remedies", however if these do not work over the course of the next week she will start the antihypertensives as prescribed by Dr. Anitra Lauth. She has been encouraged to monitor her blood pressure and follow up with Dr. Anitra Lauth regarding this issue.  [place] has verbalized understanding.

## 2019-04-12 NOTE — Assessment & Plan Note (Signed)
   Treatment plan as outlined above for allergic rhinitis.  A prescription has been provided for Pataday, one drop per eye daily as needed.  I have also recommended eye lubricant drops (i.e., Natural Tears) as needed. 

## 2019-04-12 NOTE — Patient Instructions (Addendum)
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  A laboratory order form has been provided for serum tryptase and serum specific IgE against alpha gal panel.  When lab results have returned the patient will be called with further recommendations and follow up instructions.  Until alpha gal hypersensitivity has been definitively ruled out, the importance of meticulous avoidance of non-primate mammalian meat has been discussed and emphasized.  A prescription has been provided for epinephrine autoinjector Q) 2 pack along with instructions for its appropriate administration.  If laboratory results are unrevealing, Adajah will return for further evaluation.  Other allergic rhinitis  Aeroallergen avoidance measures have been provided in written form.  Samples have been provided for levocetirizine (Xyzal) 5 mg daily as needed.  Flonase or Nasacort AQ, 2 sprays per nostril daily as needed.  Nasal saline spray (i.e., Simply Saline) or nasal saline lavage (i.e., NeilMed) is recommended as needed and prior to medicated nasal sprays.  Allergic conjunctivitis  Treatment plan as outlined above for allergic rhinitis.  A prescription has been provided for Pataday, one drop per eye daily as needed.  I have also recommended eye lubricant drops (i.e., Natural Tears) as needed.  Elevated blood-pressure reading without diagnosis of hypertension  The patient has been made aware of the elevated blood pressure reading.  She notes that she is "trying natural remedies", however if these do not work over the course of the next week she will start the antihypertensives as prescribed by Dr. Anitra Lauth. She has been encouraged to monitor her blood pressure and follow up with Dr. Anitra Lauth regarding this issue.  [place] has verbalized understanding.   When lab results have returned the patient will be called with further recommendations and follow up instructions.  Reducing  Pollen Exposure  The [employer] Academy of All[employer] suggests the following steps to reduce your exposure to pollen during allergy seasons.    1. Do not hang sheets or clothing out to dry; pollen may collect on these items. 2. Do not mow lawns or spend time around freshly cut grass; mowing stirs up pollen. 3. Keep windows closed at night.  Keep car windows closed while driving. 4. Minimize morning activities outdoors, a time when pollen counts are usually at their highest. 5. Stay indoors as much as possible when pollen counts or humidity is high and on windy days when pollen tends to remain in the air longer. 6. Use air conditioning when possible.  Many air conditioners have filters that trap the pollen spores. 7. Use a HEPA room air filter to remove pollen form the indoor air you breathe.

## 2019-04-12 NOTE — Progress Notes (Signed)
New Patient Note  RE: Melissa Harrington MRN: [id number] DOB: Jun 12, 1971 Date of Office Visit: 04/12/2019  Referring provider: Tammi Sou, MD Primary care provider: Tammi Sou, MD  Chief Complaint: Allergic Reaction and Rash   History of present illness: Melissa Harrington is a 48 y.o. female seen today in consultation requested by Shawnie Dapper, MD.  She reports that on March 11, 2019 she was on a walk with her daughter and had 4 tick bites.  2 of the ticks were not found for over 24 hours.  The tics were described as "little black tics" which she believes to have been deer ticks.  She does not recall seen a white spot on the back of any other ticks indicative of [place] tick.  Coincidentally, following the tick bites she went on a plant-based diet.  On May 4, she reintroduced meat into her diet.  On that evening at approximately 6 PM she consumed pork for dinner and later that night experienced "severe stomach pain."  At that time, she did not experience concomitant urticaria, angioedema, or cardiopulmonary symptoms.  On May 8 she consumed "a tiny bite" of cube steak and a few hours later experienced abdominal discomfort/pain.  On May 14 she consumed a hamburger and a few/several hours later she experienced severe abdominal pain, vomiting, and diarrhea.  The next morning she woke up and noticed a rash on her torso and upper extremities.  The rash was described as "discoloration that look like measles."  The rash resolved over the course of the next 2 or 3 days.  She has avoided beef and pork since that time.  She currently does not have an epinephrine autoinjector.  She is able to consume other allergenic foods, such as nuts, fish, shellfish, eggs, and dairy products, on a regular basis without symptoms. She experiences nasal congestion, rhinorrhea, sneezing, nasal pruritus, and ocular pruritus.  These symptoms occur exclusively in the fall.  She attempts to control the symptoms with  over-the-counter loratadine.  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  A laboratory order form has been provided for serum tryptase and serum specific IgE against alpha gal panel.  When lab results have returned the patient will be called with further recommendations and follow up instructions.  Until alpha gal hypersensitivity has been definitively ruled out, the importance of meticulous avoidance of non-primate mammalian meat has been discussed and emphasized.  A prescription has been provided for epinephrine autoinjector Q) 2 pack along with instructions for its appropriate administration.  If laboratory results are unrevealing, Melissa Harrington will return for further evaluation.  Other allergic rhinitis  Aeroallergen avoidance measures have been provided in written form.  Samples have been provided for levocetirizine (Xyzal) 5 mg daily as needed.  Flonase or Nasacort AQ, 2 sprays per nostril daily as needed.  Nasal saline spray (i.e., Simply Saline) or nasal saline lavage (i.e., NeilMed) is recommended as needed and prior to medicated nasal sprays.  Allergic conjunctivitis  Treatment plan as outlined above for allergic rhinitis.  A prescription has been provided for Pataday, one drop per eye daily as needed.  I have also recommended eye lubricant drops (i.e., Natural Tears) as needed.  Elevated blood-pressure reading without diagnosis of hypertension  The patient has been made aware of the elevated blood pressure reading.  She notes that she is "trying natural remedies", however if these do not work over the course of the next week she will start  the antihypertensives as prescribed by Dr. Anitra Lauth. She has been encouraged to monitor her blood pressure and follow up with Dr. Anitra Lauth regarding this issue.  [place] has verbalized understanding.   Meds ordered this encounter  Medications  . EPINEPHrine (AUVI-Q) 0.3 mg/0.3  mL IJ SOAJ injection    Sig: Inject 0.3 mLs (0.3 mg total) into the muscle as needed for anaphylaxis.    Dispense:  2 Device    Refill:  2    [phone] (M)  . fluticasone (FLONASE) 50 MCG/ACT nasal spray    Sig: Place 2 sprays into both nostrils daily as needed for allergies or rhinitis.    Dispense:  16 g    Refill:  3  . Olopatadine HCl (PATADAY) 0.2 % SOLN    Sig: Place 1 drop into both eyes daily as needed.    Dispense:  1 Bottle    Refill:  3    Diagnostics: Allergy skin testing: Deferred.    Physical examination: Blood pressure (!) 136/100, pulse 83, temperature 98.3 F (36.8 C), temperature source Temporal, resp. rate 16, height 5\' 8"  (1.727 m), weight 176 lb 12.8 oz (80.2 kg), SpO2 100 %.  General: Alert, interactive, in no acute distress. HEENT: TMs pearly gray, turbinates mildly edematous without discharge, post-pharynx mildly erythematous. Neck: Supple without lymphadenopathy. Lungs: Clear to auscultation without wheezing, rhonchi or rales. CV: Normal S1, S2 without murmurs. Abdomen: Nondistended, nontender. Skin: Warm and dry, without lesions or rashes. Extremities:  No clubbing, cyanosis or edema. Neuro:   Grossly intact.  Review of systems:  Review of systems negative except as noted in HPI / PMHx or noted below: Review of Systems  Constitutional: Negative.   HENT: Negative.   Eyes: Negative.   Respiratory: Negative.   Cardiovascular: Negative.   Gastrointestinal: Negative.   Genitourinary: Negative.   Musculoskeletal: Negative.   Skin: Negative.   Neurological: Negative.   Endo/Heme/Allergies: Negative.   Psychiatric/Behavioral: Negative.     Past medical history:  Past Medical History:  Diagnosis Date  . Anxiety    + hx of panic attacks.  Was on lexapro for a short time in remote past.  . Dyspareunia 10/10/2013  . Endometrial polyp 10/17/2013  . Hay fever   . Hemorrhoids 10/10/2013  . History of melanoma   . Menometrorrhagia 2013   using  herbal treatments and this has resolved.  . Other and unspecified ovarian cyst 10/17/2013    Past surgical history:  Past Surgical History:  Procedure Laterality Date  . CESAREAN SECTION     3  . ECTOPIC PREGNANCY SURGERY     four surgeries (left fallopian tube removed)  . SKIN CANCER EXCISION  2007   on pt's back     Family history: Family History  Problem Relation Age of Onset  . Lung cancer Mother 32       Former smoker  . Mental illness Father   . Alcohol abuse Father   . Hypertension Father   . Heart disease Maternal Grandmother   . Alcohol abuse Maternal Grandmother   . Alcohol abuse Paternal Grandfather   . Alcohol abuse Paternal Grandmother   . Alcohol abuse Maternal Grandfather   . Allergic rhinitis Neg Hx   . Asthma Neg Hx   . Eczema Neg Hx   . Urticaria Neg Hx     Social history: Social History   Socioeconomic History  . Marital status: Married    Spouse name: Not on file  . Number of children: Not on  file  . Years of education: Not on file  . Highest education level: Not on file  Occupational History  . Not on file  Social Needs  . Financial resource strain: Not on file  . Food insecurity:    Worry: Not on file    Inability: Not on file  . Transportation needs:    Medical: Not on file    Non-medical: Not on file  Tobacco Use  . Smoking status: Never Smoker  . Smokeless tobacco: Never Used  Substance and Sexual Activity  . Alcohol use: Yes    Alcohol/week: 2.0 standard drinks    Types: 2 Glasses of wine per week    Comment: occ  . Drug use: No  . Sexual activity: Yes    Birth control/protection: Surgical  Lifestyle  . Physical activity:    Days per week: Not on file    Minutes per session: Not on file  . Stress: Not on file  Relationships  . Social connections:    Talks on phone: Not on file    Gets together: Not on file    Attends religious service: Not on file    Active member of club or organization: Not on file    Attends  meetings of clubs or organizations: Not on file    Relationship status: Not on file  . Intimate partner violence:    Fear of current or ex partner: Not on file    Emotionally abused: Not on file    Physically abused: Not on file    Forced sexual activity: Not on file  Other Topics Concern  . Not on file  Social History Narrative   Married, 3 children.   Educ: BA from [employer]   Occup: homemaker   Environmental History: Patient lives in a house [place] in [place] with [place] in the bedroom, wood heat, and window air conditioning units.  There is a dog in the home which has access to her bedroom.  There is no known mold/water damage in the home.  She is a non-smoker.  Allergies as of 04/12/2019      Reactions   Penicillins Hives   Erythromycin Rash      Medication List       Accurate as of Apr 12, 2019 11:45 AM. If you have any questions, ask your nurse or doctor.        ALPRAZolam 0.5 MG tablet Commonly known as:  XANAX 1 tab po bid prn anxiety   amLODipine-benazepril 5-10 MG capsule Commonly known as:  LOTREL Take 1 capsule by mouth daily.   EPINEPHrine 0.3 mg/0.3 mL Soaj injection Commonly known as:  Auvi-Q Inject 0.3 mLs (0.3 mg total) into the muscle as needed for anaphylaxis. Started by:  Edmonia Lynch, MD   fluticasone 50 MCG/ACT nasal spray Commonly known as:  FLONASE Place 2 sprays into both nostrils daily as needed for allergies or rhinitis. Started by:  Edmonia Lynch, MD   ibuprofen 400 MG tablet Commonly known as:  ADVIL Take 400 mg by mouth every 6 (six) hours as needed.   Olopatadine HCl 0.2 % Soln Commonly known as:  Pataday Place 1 drop into both eyes daily as needed. Started by:  Edmonia Lynch, MD       Known medication allergies: Allergies  Allergen Reactions  . Penicillins Hives  . Erythromycin Rash    I appreciate the opportunity to take part in [facility] care. Please do not hesitate to contact me with questions.  Sincerely,    R. Edgar Frisk, MD

## 2019-04-12 NOTE — Assessment & Plan Note (Addendum)
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  A laboratory order form has been provided for serum tryptase and serum specific IgE against alpha gal panel.  When lab results have returned the patient will be called with further recommendations and follow up instructions.  Until alpha gal hypersensitivity has been definitively ruled out, the importance of meticulous avoidance of non-primate mammalian meat has been discussed and emphasized.  A prescription has been provided for epinephrine autoinjector Q) 2 pack along with instructions for its appropriate administration.  If laboratory results are unrevealing, Melissa Harrington will return for further evaluation.

## 2019-04-12 NOTE — Assessment & Plan Note (Signed)
   Aeroallergen avoidance measures have been provided in written form.  Samples have been provided for levocetirizine (Xyzal) 5 mg daily as needed.  Flonase or Nasacort AQ, 2 sprays per nostril daily as needed.  Nasal saline spray (i.e., Simply Saline) or nasal saline lavage (i.e., NeilMed) is recommended as needed and prior to medicated nasal sprays.

## 2019-04-16 LAB — ALPHA-GAL PANEL
Beef (Bos spp) IgE: 84.1 kU/L — ABNORMAL HIGH (ref <0.35)
Class Interpretation: 3
Class Interpretation: 4
Class Interpretation: 5
Lamb/Mutton (Ovis spp) IgE: 7.27 kU/L — ABNORMAL HIGH (ref <0.35)
Pork (Sus spp) IgE: 42.4 kU/L — ABNORMAL HIGH (ref <0.35)

## 2019-04-16 LAB — TRYPTASE: Tryptase: 6.2 ug/L (ref 2.2–13.2)

## 2019-04-18 ENCOUNTER — Encounter: Payer: Self-pay | Admitting: Family Medicine

## 2019-06-20 ENCOUNTER — Telehealth: Payer: Self-pay | Admitting: Family Medicine

## 2019-06-20 NOTE — Telephone Encounter (Signed)
Pt started taking  amLODipine-benazepril (LOTREL) 5-10 MG capsule that was prescribed and is now on day 3 .She states that from chest up she is bright red. Is this normal and will it go away

## 2019-06-20 NOTE — Telephone Encounter (Signed)
Patient was advised and scheduled for follow up appt next week, 8/11.

## 2019-06-20 NOTE — Telephone Encounter (Signed)
Please advise, thanks.

## 2019-06-20 NOTE — Telephone Encounter (Signed)
Not normal. Suspect allergic rash. Stop the amlodipine/benazepril tab. Take benadryl 25mg  otc tab, 1 q6h prn until rash starts to subside.  Reassure her that it will go away. Make f/u appt for sometime in the next 7-10d.-thx

## 2019-06-28 ENCOUNTER — Ambulatory Visit (INDEPENDENT_AMBULATORY_CARE_PROVIDER_SITE_OTHER): Payer: 59 | Admitting: Family Medicine

## 2019-06-28 ENCOUNTER — Encounter: Payer: Self-pay | Admitting: Family Medicine

## 2019-06-28 ENCOUNTER — Other Ambulatory Visit: Payer: Self-pay

## 2019-06-28 VITALS — BP 157/106 | HR 87 | Temp 98.3°F | Resp 16 | Ht 68.0 in | Wt 176.6 lb

## 2019-06-28 DIAGNOSIS — I10 Essential (primary) hypertension: Secondary | ICD-10-CM | POA: Diagnosis not present

## 2019-06-28 DIAGNOSIS — F419 Anxiety disorder, unspecified: Secondary | ICD-10-CM | POA: Diagnosis not present

## 2019-06-28 DIAGNOSIS — T50905A Adverse effect of unspecified drugs, medicaments and biological substances, initial encounter: Secondary | ICD-10-CM

## 2019-06-28 DIAGNOSIS — F4323 Adjustment disorder with mixed anxiety and depressed mood: Secondary | ICD-10-CM

## 2019-06-28 DIAGNOSIS — F4321 Adjustment disorder with depressed mood: Secondary | ICD-10-CM

## 2019-06-28 LAB — LIPID PANEL
Cholesterol: 205 mg/dL — ABNORMAL HIGH (ref 0–200)
HDL: 82.8 mg/dL (ref >39.00)
LDL Cholesterol: 102 mg/dL — ABNORMAL HIGH (ref 0–99)
NonHDL: 121.71
Total CHOL/HDL Ratio: 2
Triglycerides: 99 mg/dL (ref 0.0–149.0)
VLDL: 19.8 mg/dL (ref 0.0–40.0)

## 2019-06-28 LAB — COMPREHENSIVE METABOLIC PANEL
ALT: 20 U/L (ref 0–35)
AST: 24 U/L (ref 0–37)
Albumin: 4.8 g/dL (ref 3.5–5.2)
Alkaline Phosphatase: 63 U/L (ref 39–117)
BUN: 9 mg/dL (ref 6–23)
CO2: 27 mEq/L (ref 19–32)
Calcium: 9.8 mg/dL (ref 8.4–10.5)
Chloride: 102 mEq/L (ref 96–112)
Creatinine, Ser: 0.82 mg/dL (ref 0.40–1.20)
GFR: 74.08 mL/min (ref >60.00)
Glucose, Bld: 135 mg/dL — ABNORMAL HIGH (ref 70–99)
Potassium: 3.5 mEq/L (ref 3.5–5.1)
Sodium: 138 mEq/L (ref 135–145)
Total Bilirubin: 0.4 mg/dL (ref 0.2–1.2)
Total Protein: 7.3 g/dL (ref 6.0–8.3)

## 2019-06-28 LAB — CBC WITH DIFFERENTIAL/PLATELET
Basophils Absolute: 0 10*3/uL (ref 0.0–0.1)
Basophils Relative: 0.8 % (ref 0.0–3.0)
Eosinophils Absolute: 0.1 10*3/uL (ref 0.0–0.7)
Eosinophils Relative: 2.4 % (ref 0.0–5.0)
HCT: 44.1 % (ref 36.0–46.0)
Hemoglobin: 14.6 g/dL (ref 12.0–15.0)
Lymphocytes Relative: 24.3 % (ref 12.0–46.0)
Lymphs Abs: 1.1 10*3/uL (ref 0.7–4.0)
MCHC: 33.1 g/dL (ref 30.0–36.0)
MCV: 91.3 fl (ref 78.0–100.0)
Monocytes Absolute: 0.4 10*3/uL (ref 0.1–1.0)
Monocytes Relative: 8.8 % (ref 3.0–12.0)
Neutro Abs: 2.8 10*3/uL (ref 1.4–7.7)
Neutrophils Relative %: 63.7 % (ref 43.0–77.0)
Platelets: 216 10*3/uL (ref 150.0–400.0)
RBC: 4.83 Mil/uL (ref 3.87–5.11)
RDW: 13.9 % (ref 11.5–15.5)
WBC: 4.4 10*3/uL (ref 4.0–10.5)

## 2019-06-28 LAB — TSH: TSH: 1.53 u[IU]/mL (ref 0.35–4.50)

## 2019-06-28 MED ORDER — ALPRAZOLAM 0.5 MG PO TABS: ORAL_TABLET | ORAL | 3 refills | Status: DC

## 2019-06-28 MED ORDER — METOPROLOL SUCCINATE ER 50 MG PO TB24: 50.0000 mg | ORAL_TABLET | Freq: Every day | ORAL | 0 refills | Status: DC

## 2019-06-28 NOTE — Progress Notes (Signed)
OFFICE VISIT  06/28/2019   CC:  Chief Complaint  Patient presents with  . Follow-up    medication reaction, amlodipine   HPI:    Patient is a 48 y.o. Caucasian female who presents for f/u HTN. BP was steadily climbing after last visit so she finally started the bp med I recommended. Pt noted rash after she started taking lotrel GELCAP.  Started about 1 hr after taking pill. Red/hot rash of tiny bumps from chest up to/involving face--"like a heat rash".  Took benadryl after 3rd dose, rash went away in 1 hour after taking the benadryl. Stopped med, no further probs with rash.   She has alpha gal allergy,  Has remained off of mammalian meat and has no further rxns.  BP's remain about 150s/90s avg at home when not on any meds.  Grief rxn (her mom d. This year from lung ca): taking xanax prn x 2 mo did help.  Still gets lots of anxiet to the level of panic at nights.   Mind racing with anxiety, can't sleep well at all.  Covid 19 stress, family stressors +. Mood is down/sad much of the time but she pulls things together pretty quick.  No SI or HI. Taking some natural supplements to help but these are too costly to continue. Needs to get back on alpraz qd-bid.  ROS: no CP, no SOB, no wheezing, no cough, no dizziness, no HAs, no rashes, no melena/hematochezia.  No polyuria or polydipsia.  No myalgias or arthralgias.  Past Medical History:  Diagnosis Date  . 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 Verlin Fester  . Anxiety    + hx of panic attacks.  Was on lexapro for a short time in remote past.  . Dyspareunia 10/10/2013  . Endometrial polyp 10/17/2013  . Essential hypertension   . Hay fever   . Hemorrhoids 10/10/2013  . History of melanoma   . Menometrorrhagia 2013   using herbal treatments and this has resolved.  . Other and unspecified ovarian cyst 10/17/2013    Past Surgical History:  Procedure Laterality Date  . CESAREAN SECTION     3  . ECTOPIC PREGNANCY SURGERY     four surgeries  (left fallopian tube removed)  . SKIN CANCER EXCISION  2007   on pt's back     Outpatient Medications Prior to Visit  Medication Sig Dispense Refill  . EPINEPHrine (AUVI-Q) 0.3 mg/0.3 mL IJ SOAJ injection Inject 0.3 mLs (0.3 mg total) into the muscle as needed for anaphylaxis. (Patient not taking: Reported on 06/28/2019) 2 Device 2  . fluticasone (FLONASE) 50 MCG/ACT nasal spray Place 2 sprays into both nostrils daily as needed for allergies or rhinitis. (Patient not taking: Reported on 06/28/2019) 16 g 3  . ibuprofen (ADVIL,MOTRIN) 400 MG tablet Take 400 mg by mouth every 6 (six) hours as needed.    . Olopatadine HCl (PATADAY) 0.2 % SOLN Place 1 drop into both eyes daily as needed. (Patient not taking: Reported on 06/28/2019) 1 Bottle 3  . ALPRAZolam (XANAX) 0.5 MG tablet 1 tab po bid prn anxiety (Patient not taking: Reported on 06/28/2019) 60 tablet 1  . amLODipine-benazepril (LOTREL) 5-10 MG capsule Take 1 capsule by mouth daily. (Patient not taking: Reported on 04/04/2019) 30 capsule 0   No facility-administered medications prior to visit.     Allergies  Allergen Reactions  . Penicillins Hives  . Amlodipine Rash  . Benazepril Rash  . Erythromycin Rash    ROS As per HPI  PE: Blood pressure (!) 157/106, pulse 87, temperature 98.3 F (36.8 C), temperature source Temporal, resp. rate 16, height 5\' 8"  (1.727 m), weight 176 lb 9.6 oz (80.1 kg), SpO2 98 %. Gen: Alert, well appearing.  Patient is oriented to person, place, time, and situation. AFFECT: pleasant, lucid thought and speech. SKIN: no rash. CV: RRR, no m/r/g.   LUNGS: CTA bilat, nonlabored resps, good aeration in all lung fields. EXT: no clubbing or cyanosis.  no edema.    LABS:    Chemistry      Component Value Date/Time   NA 134 03/06/2017 0826   K 4.6 03/06/2017 0826   CL 97 03/06/2017 0826   CO2 24 03/06/2017 0826   BUN 11 03/06/2017 0826   CREATININE 0.86 03/06/2017 0826      Component Value Date/Time    CALCIUM 9.4 03/06/2017 0826   ALKPHOS 50 03/06/2017 0826   AST 19 03/06/2017 0826   ALT 12 03/06/2017 0826   BILITOT 0.6 03/06/2017 0826     Lab Results  Component Value Date   CHOL 196 03/06/2017   HDL 86 03/06/2017   LDLCALC 88 03/06/2017   TRIG 108 03/06/2017   CHOLHDL 2.3 03/06/2017    IMPRESSION AND PLAN:  1) Medication reaction: combo capsule (alpha gal-type protein in the gel cap?, vs allergy to amlo or benaz).  No further prob since stopping lotrel cap.  2) Uncontrolled HTN:  Start toprol xl 50 tab qd. Health panel labs today: she ate only a tomato sandwich today.  3) Adjustment d/o with mixed anx/dep features: Grief rxn + inc life worries lately. She did well with prn alpraz in the past and we'll restart her on this at 0.5mg  bid prn, #60, RF x 3. Therapeutic expectations and side effect profile of medication discussed today.  Patient's questions answered. We'll get CSC in chart if she is requiring RFs beyond what I gave her today. If this does continue a couple more months, she is in favor of starting an antidepressant at that time (but not now).   An After Visit Summary was printed and given to the patient.  FOLLOW UP: Return in about 1 week (around 07/05/2019) for f/u HTN (telemedicine).  Signed:  Crissie Sickles, MD           06/28/2019

## 2019-07-07 ENCOUNTER — Ambulatory Visit (INDEPENDENT_AMBULATORY_CARE_PROVIDER_SITE_OTHER): Payer: 59 | Admitting: Family Medicine

## 2019-07-07 ENCOUNTER — Other Ambulatory Visit: Payer: Self-pay

## 2019-07-07 ENCOUNTER — Encounter: Payer: Self-pay | Admitting: Family Medicine

## 2019-07-07 VITALS — BP 165/98 | HR 80

## 2019-07-07 DIAGNOSIS — I10 Essential (primary) hypertension: Secondary | ICD-10-CM | POA: Diagnosis not present

## 2019-07-07 DIAGNOSIS — F4322 Adjustment disorder with anxiety: Secondary | ICD-10-CM

## 2019-07-07 MED ORDER — METOPROLOL SUCCINATE ER 50 MG PO TB24: ORAL_TABLET | ORAL | 0 refills | Status: DC

## 2019-07-07 NOTE — Progress Notes (Signed)
Virtual Visit via Video Note  I connected with pt on 07/07/19 at  8:30 AM EDT by a video enabled telemedicine application and verified that I am speaking with the correct person using two identifiers.  Location patient: home Location provider:work or home office Persons participating in the virtual visit: patient, provider  I discussed the limitations of evaluation and management by telemedicine and the availability of in person appointments. The patient expressed understanding and agreed to proceed.  Telemedicine visit is a necessity given the COVID-19 restrictions in place at the current time.  HPI: 48 y/o WF being seen today for 9 day f/u HTN. Recent fasting health panel labs all normal. Started pt on toprol xl 50mg  qd last visit. Also got her back on alprazolam for acute stress rxn/adjustment d/o related to lots of life changes (particularly the death of her mother) in the last year or so.  Interim hx: Home checks: Best reading 129/91, highest 166/107.  HR 75-85. Some HA's "nagging" since starting med, only one "migraine level". No other side effects.  Anxiety level improved since getting back on alpraz, taking it bid currently, sleeping better.  ROS: See pertinent positives and negatives per HPI.  Past Medical History:  Diagnosis Date  . 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 Verlin Fester  . Anxiety    + hx of panic attacks.  Was on lexapro for a short time in remote past.  . Dyspareunia 10/10/2013  . Endometrial polyp 10/17/2013  . Essential hypertension   . Hay fever   . Hemorrhoids 10/10/2013  . History of melanoma   . Menometrorrhagia 2013   using herbal treatments and this has resolved.  . Other and unspecified ovarian cyst 10/17/2013    Past Surgical History:  Procedure Laterality Date  . CESAREAN SECTION     3  . ECTOPIC PREGNANCY SURGERY     four surgeries (left fallopian tube removed)  . SKIN CANCER EXCISION  2007   on pt's back     Family History  Problem  Relation Age of Onset  . Lung cancer Mother 21       Former smoker  . Mental illness Father   . Alcohol abuse Father   . Hypertension Father   . Heart disease Maternal Grandmother   . Alcohol abuse Maternal Grandmother   . Alcohol abuse Paternal Grandfather   . Alcohol abuse Paternal Grandmother   . Alcohol abuse Maternal Grandfather   . Allergic rhinitis Neg Hx   . Asthma Neg Hx   . Eczema Neg Hx   . Urticaria Neg Hx      Current Outpatient Medications:  .  ALPRAZolam (XANAX) 0.5 MG tablet, 1 tab po bid prn anxiety, Disp: 60 tablet, Rfl: 3 .  metoprolol succinate (TOPROL-XL) 50 MG 24 hr tablet, Take 1 tablet (50 mg total) by mouth daily. Take with or immediately following a meal., Disp: 30 tablet, Rfl: 0 .  EPINEPHrine (AUVI-Q) 0.3 mg/0.3 mL IJ SOAJ injection, Inject 0.3 mLs (0.3 mg total) into the muscle as needed for anaphylaxis. (Patient not taking: Reported on 06/28/2019), Disp: 2 Device, Rfl: 2 .  fluticasone (FLONASE) 50 MCG/ACT nasal spray, Place 2 sprays into both nostrils daily as needed for allergies or rhinitis. (Patient not taking: Reported on 06/28/2019), Disp: 16 g, Rfl: 3 .  ibuprofen (ADVIL,MOTRIN) 400 MG tablet, Take 400 mg by mouth every 6 (six) hours as needed., Disp: , Rfl:  .  Olopatadine HCl (PATADAY) 0.2 % SOLN, Place 1 drop into  both eyes daily as needed. (Patient not taking: Reported on 06/28/2019), Disp: 1 Bottle, Rfl: 3  EXAM:  VITALS per patient if applicable: BP (!) 161/09 (BP Location: Left Arm, Patient Position: Sitting, Cuff Size: Normal)   Pulse 80    GENERAL: alert, oriented, appears well and in no acute distress  HEENT: atraumatic, conjunttiva clear, no obvious abnormalities on inspection of external nose and ears  NECK: normal movements of the head and neck  LUNGS: on inspection no signs of respiratory distress, breathing rate appears normal, no obvious gross SOB, gasping or wheezing  CV: no obvious cyanosis  MS: moves all visible  extremities without noticeable abnormality  PSYCH/NEURO: pleasant and cooperative, no obvious depression or anxiety, speech and thought processing grossly intact  LABS: none today    Chemistry      Component Value Date/Time   NA 138 06/28/2019 1208   NA 134 03/06/2017 0826   K 3.5 06/28/2019 1208   CL 102 06/28/2019 1208   CO2 27 06/28/2019 1208   BUN 9 06/28/2019 1208   BUN 11 03/06/2017 0826   CREATININE 0.82 06/28/2019 1208      Component Value Date/Time   CALCIUM 9.8 06/28/2019 1208   ALKPHOS 63 06/28/2019 1208   AST 24 06/28/2019 1208   ALT 20 06/28/2019 1208   BILITOT 0.4 06/28/2019 1208   BILITOT 0.6 03/06/2017 0826       ASSESSMENT AND PLAN:  Discussed the following assessment and plan:  1) Uncontrolled HTN: doing fine but we need to up-titrate metoprolol as long as HR permits. We'll see how HA's go as we increase the dose. Take TWO of the 50mg  tabs qd and continue bp/hr monitoring daily.  2) Anxiety/stress rxn: doing better. Plan is to take alpraz bid prn, she'll try to ween off morning dose at some point in near future and then eventually wants to ween off evening dose as time goes on. No new rx for this med today.    I discussed the assessment and treatment plan with the patient. The patient was provided an opportunity to ask questions and all were answered. The patient agreed with the plan and demonstrated an understanding of the instructions.   The patient was advised to call back or seek an in-person evaluation if the symptoms worsen or if the condition fails to improve as anticipated.  F/u: 2 wks  Signed:  Crissie Sickles, MD           07/07/2019

## 2019-07-14 ENCOUNTER — Other Ambulatory Visit: Payer: Self-pay | Admitting: Family Medicine

## 2019-07-27 ENCOUNTER — Other Ambulatory Visit: Payer: Self-pay

## 2019-07-27 ENCOUNTER — Ambulatory Visit (INDEPENDENT_AMBULATORY_CARE_PROVIDER_SITE_OTHER): Payer: 59 | Admitting: Family Medicine

## 2019-07-27 ENCOUNTER — Encounter: Payer: Self-pay | Admitting: Family Medicine

## 2019-07-27 VITALS — BP 130/93 | HR 85

## 2019-07-27 DIAGNOSIS — F4323 Adjustment disorder with mixed anxiety and depressed mood: Secondary | ICD-10-CM | POA: Diagnosis not present

## 2019-07-27 DIAGNOSIS — I10 Essential (primary) hypertension: Secondary | ICD-10-CM

## 2019-07-27 MED ORDER — HYDROCHLOROTHIAZIDE 25 MG PO TABS: 25.0000 mg | ORAL_TABLET | Freq: Every day | ORAL | 0 refills | Status: DC

## 2019-07-27 NOTE — Progress Notes (Signed)
Virtual Visit via Video Note  I connected with pt on 07/27/19 at  8:30 AM EDT by a video enabled telemedicine application and verified that I am speaking with the correct person using two identifiers.  Location patient: home Location provider:work or home office Persons participating in the virtual visit: patient, provider  I discussed the limitations of evaluation and management by telemedicine and the availability of in person appointments. The patient expressed understanding and agreed to proceed.  Telemedicine visit is a necessity given the COVID-19 restrictions in place at the current time.  HPI: 48 y/o WF being seen today for f/u 3 week f/u  HTN and anxiety/acute adjustment d/o related to lots of life changes (particularly the death of her mother) in the last year or so. Last visit we increased her metoprolol to TWO of the 50mg  toprol xl tabs daily. Her anxiety was improved at last f/u on alpraz prn and she knows to try to use these sparingly.   Interim hx:  NSAID use-->takes 2 tabs po bid lately.   BP still not coming down:  130s-160s over 90s-100s consistently, with HRs 80-85 consistently.   MIssed a dose one day and felt generalized malaise, HA.  She seems to think the toprol makes her feel too SOB/fatigued. Went to [employer] last week, which was nice. Has generalized achiness in back or shoulders.  Anxiety is fine as long as she takes her alprazolam. No persistent depressed mood but still grieving appropriately  No SI or HI.  ROS: See pertinent positives and negatives per HPI.  Past Medical History:  Diagnosis Date  . 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 Verlin Fester  . Anxiety    + hx of panic attacks.  Was on lexapro for a short time in remote past.  . Dyspareunia 10/10/2013  . Endometrial polyp 10/17/2013  . Essential hypertension   . Hay fever   . Hemorrhoids 10/10/2013  . History of melanoma   . Menometrorrhagia 2013   using herbal treatments and this has resolved.  .  Other and unspecified ovarian cyst 10/17/2013    Past Surgical History:  Procedure Laterality Date  . CESAREAN SECTION     3  . ECTOPIC PREGNANCY SURGERY     four surgeries (left fallopian tube removed)  . SKIN CANCER EXCISION  2007   on pt's back     Family History  Problem Relation Age of Onset  . Lung cancer Mother 81       Former smoker  . Mental illness Father   . Alcohol abuse Father   . Hypertension Father   . Heart disease Maternal Grandmother   . Alcohol abuse Maternal Grandmother   . Alcohol abuse Paternal Grandfather   . Alcohol abuse Paternal Grandmother   . Alcohol abuse Maternal Grandfather   . Allergic rhinitis Neg Hx   . Asthma Neg Hx   . Eczema Neg Hx   . Urticaria Neg Hx      Current Outpatient Medications:  .  ALPRAZolam (XANAX) 0.5 MG tablet, 1 tab po bid prn anxiety, Disp: 60 tablet, Rfl: 3 .  ibuprofen (ADVIL,MOTRIN) 400 MG tablet, Take 400 mg by mouth every 6 (six) hours as needed., Disp: , Rfl:  .  metoprolol succinate (TOPROL-XL) 50 MG 24 hr tablet, 1-2 tabs po qd. Take with or immediately following a meal., Disp: 60 tablet, Rfl: 0 .  EPINEPHrine (AUVI-Q) 0.3 mg/0.3 mL IJ SOAJ injection, Inject 0.3 mLs (0.3 mg total) into the muscle as needed  for anaphylaxis. (Patient not taking: Reported on 07/27/2019), Disp: 2 Device, Rfl: 2 .  fluticasone (FLONASE) 50 MCG/ACT nasal spray, Place 2 sprays into both nostrils daily as needed for allergies or rhinitis. (Patient not taking: Reported on 07/27/2019), Disp: 16 g, Rfl: 3 .  hydrochlorothiazide (HYDRODIURIL) 25 MG tablet, Take 1 tablet (25 mg total) by mouth daily., Disp: 30 tablet, Rfl: 0 .  Olopatadine HCl (PATADAY) 0.2 % SOLN, Place 1 drop into both eyes daily as needed. (Patient not taking: Reported on 07/27/2019), Disp: 1 Bottle, Rfl: 3  EXAM:  VITALS per patient if applicable: BP (!) 123XX123 (BP Location: Left Arm, Patient Position: Sitting, Cuff Size: Normal)   Pulse 85    GENERAL: alert, oriented,  appears well and in no acute distress  HEENT: atraumatic, conjunttiva clear, no obvious abnormalities on inspection of external nose and ears  NECK: normal movements of the head and neck  LUNGS: on inspection no signs of respiratory distress, breathing rate appears normal, no obvious gross SOB, gasping or wheezing  CV: no obvious cyanosis  MS: moves all visible extremities without noticeable abnormality  PSYCH/NEURO: pleasant and cooperative, no obvious depression or anxiety, speech and thought processing grossly intact  LABS: none today    Chemistry      Component Value Date/Time   NA 138 06/28/2019 1208   NA 134 03/06/2017 0826   K 3.5 06/28/2019 1208   CL 102 06/28/2019 1208   CO2 27 06/28/2019 1208   BUN 9 06/28/2019 1208   BUN 11 03/06/2017 0826   CREATININE 0.82 06/28/2019 1208      Component Value Date/Time   CALCIUM 9.8 06/28/2019 1208   ALKPHOS 63 06/28/2019 1208   AST 24 06/28/2019 1208   ALT 20 06/28/2019 1208   BILITOT 0.4 06/28/2019 1208   BILITOT 0.6 03/06/2017 0826      ASSESSMENT AND PLAN:  Discussed the following assessment and plan:  1) Uncontrolled HTN: not improved, plus ? Malaise/HA's on toprol xl. D/c toprol XL. Start trial of hctz 25mg  qd, #30.  Therapeutic expectations and side effect profile of medication discussed today.  Patient's questions answered. Continue home bp/hr monitoring. We'll recheck BMET at the time of next visit in 2 wks.  2) Adjustment d/o with mixed anxious/depressed mood: stable but still requiring alprz 0.5mg  bid. Ok to continue this. No new rx given for this med today.  We discussed possible serious and likely etiologies, options for evaluation and workup, limitations of telemedicine visit vs in person visit, treatment, treatment risks and precautions. Pt prefers to treat via telemedicine empirically rather then risking or undertaking an in person visit at this moment. Patient agrees to seek prompt in person care if  worsening, new symptoms arise, or if is not improving with treatment.   I discussed the assessment and treatment plan with the patient. The patient was provided an opportunity to ask questions and all were answered. The patient agreed with the plan and demonstrated an understanding of the instructions.   The patient was advised to call back or seek an in-person evaluation if the symptoms worsen or if the condition fails to improve as anticipated.  F/u: 2 weeks, telemedicine  Signed:  Crissie Sickles, MD           07/27/2019

## 2019-08-19 ENCOUNTER — Ambulatory Visit: Payer: 59 | Admitting: Family Medicine

## 2019-08-26 ENCOUNTER — Encounter: Payer: Self-pay | Admitting: Family Medicine

## 2019-08-26 ENCOUNTER — Other Ambulatory Visit: Payer: Self-pay

## 2019-08-26 ENCOUNTER — Ambulatory Visit (INDEPENDENT_AMBULATORY_CARE_PROVIDER_SITE_OTHER): Payer: 59 | Admitting: Family Medicine

## 2019-08-26 VITALS — BP 155/110 | HR 90

## 2019-08-26 DIAGNOSIS — I10 Essential (primary) hypertension: Secondary | ICD-10-CM | POA: Diagnosis not present

## 2019-08-26 MED ORDER — CLONIDINE 0.1 MG/24HR TD PTWK: 0.1000 mg | MEDICATED_PATCH | TRANSDERMAL | 0 refills | Status: DC

## 2019-08-26 MED ORDER — HYDROCHLOROTHIAZIDE 25 MG PO TABS: 25.0000 mg | ORAL_TABLET | Freq: Every day | ORAL | 3 refills | Status: DC

## 2019-08-26 NOTE — Progress Notes (Signed)
Virtual Visit via Video Note  I connected with pt on 08/26/19 at 10:00 AM EDT by a video enabled telemedicine application and verified that I am speaking with the correct person using two identifiers.  Location patient: home Location provider:work or home office Persons participating in the virtual visit: patient, provider  I discussed the limitations of evaluation and management by telemedicine and the availability of in person appointments. The patient expressed understanding and agreed to proceed.  Telemedicine visit is a necessity given the COVID-19 restrictions in place at the current time.  HPI: 48 y/o WF being seen today for 1 mo f/u HTN. Last f/u: metoprolol->not much improved +? Intolerant->d/c'd this med. Started trial of hctz 25mg . F/u was set at 2 wks at which time she would get BMET but this never happened b/c I was out of office and she had to be rescheduled.  BP fluctuating some: 120s/70s and some up to 150s-160s over 90s-100s.   Some borderline. No side effects from the hctz at all except ? Fatigue (she admits she is pretty much always fatigued, though).  ROS: no CP, no SOB, no wheezing, no cough, no dizziness, no HAs, no rashes, no melena/hematochezia.  No polyuria or polydipsia.  No myalgias or arthralgias.   Past Medical History:  Diagnosis Date  . 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 Verlin Fester  . Anxiety    + hx of panic attacks.  Was on lexapro for a short time in remote past.  . Dyspareunia 10/10/2013  . Endometrial polyp 10/17/2013  . Essential hypertension   . Hay fever   . Hemorrhoids 10/10/2013  . History of melanoma   . Menometrorrhagia 2013   using herbal treatments and this has resolved.  . Other and unspecified ovarian cyst 10/17/2013    Past Surgical History:  Procedure Laterality Date  . CESAREAN SECTION     3  . ECTOPIC PREGNANCY SURGERY     four surgeries (left fallopian tube removed)  . SKIN CANCER EXCISION  2007   on pt's back     Family  History  Problem Relation Age of Onset  . Lung cancer Mother 13       Former smoker  . Mental illness Father   . Alcohol abuse Father   . Hypertension Father   . Heart disease Maternal Grandmother   . Alcohol abuse Maternal Grandmother   . Alcohol abuse Paternal Grandfather   . Alcohol abuse Paternal Grandmother   . Alcohol abuse Maternal Grandfather   . Allergic rhinitis Neg Hx   . Asthma Neg Hx   . Eczema Neg Hx   . Urticaria Neg Hx       Current Outpatient Medications:  .  ALPRAZolam (XANAX) 0.5 MG tablet, 1 tab po bid prn anxiety, Disp: 60 tablet, Rfl: 3 .  hydrochlorothiazide (HYDRODIURIL) 25 MG tablet, Take 1 tablet (25 mg total) by mouth daily., Disp: 30 tablet, Rfl: 0 .  EPINEPHrine (AUVI-Q) 0.3 mg/0.3 mL IJ SOAJ injection, Inject 0.3 mLs (0.3 mg total) into the muscle as needed for anaphylaxis. (Patient not taking: Reported on 08/26/2019), Disp: 2 Device, Rfl: 2 .  fluticasone (FLONASE) 50 MCG/ACT nasal spray, Place 2 sprays into both nostrils daily as needed for allergies or rhinitis. (Patient not taking: Reported on 08/26/2019), Disp: 16 g, Rfl: 3 .  ibuprofen (ADVIL,MOTRIN) 400 MG tablet, Take 400 mg by mouth every 6 (six) hours as needed., Disp: , Rfl:  .  Olopatadine HCl (PATADAY) 0.2 % SOLN, Place 1  drop into both eyes daily as needed. (Patient not taking: Reported on 08/26/2019), Disp: 1 Bottle, Rfl: 3  EXAM:  VITALS per patient if applicable: BP (!) XX123456 (BP Location: Left Arm, Patient Position: Sitting, Cuff Size: Large)   Pulse 90    GENERAL: alert, oriented, appears well and in no acute distress  HEENT: atraumatic, conjunttiva clear, no obvious abnormalities on inspection of external nose and ears  NECK: normal movements of the head and neck  LUNGS: on inspection no signs of respiratory distress, breathing rate appears normal, no obvious gross SOB, gasping or wheezing  CV: no obvious cyanosis  MS: moves all visible extremities without noticeable  abnormality  PSYCH/NEURO: pleasant and cooperative, no obvious depression or anxiety, speech and thought processing grossly intact  LAB:    Chemistry      Component Value Date/Time   NA 138 06/28/2019 1208   NA 134 03/06/2017 0826   K 3.5 06/28/2019 1208   CL 102 06/28/2019 1208   CO2 27 06/28/2019 1208   BUN 9 06/28/2019 1208   BUN 11 03/06/2017 0826   CREATININE 0.82 06/28/2019 1208      Component Value Date/Time   CALCIUM 9.8 06/28/2019 1208   ALKPHOS 63 06/28/2019 1208   AST 24 06/28/2019 1208   ALT 20 06/28/2019 1208   BILITOT 0.4 06/28/2019 1208   BILITOT 0.6 03/06/2017 0826      ASSESSMENT AND PLAN:  Discussed the following assessment and plan:  HTN: still poor/erratic control. Suspect anxiety playing some role in her fluctuations. Continue hctz 25mg  qd and add clonidine patch 0.1mg /24h and change this q week. Therapeutic expectations and side effect profile of medication discussed today.  Patient's questions answered. Continue home bp monitoring.  Goal bp 130/80 or better consistently.   I discussed the assessment and treatment plan with the patient. The patient was provided an opportunity to ask questions and all were answered. The patient agreed with the plan and demonstrated an understanding of the instructions.   The patient was advised to call back or seek an in-person evaluation if the symptoms worsen or if the condition fails to improve as anticipated.  F/u: 2 wks in office  Signed:  Crissie Sickles, MD           08/26/2019

## 2019-09-09 ENCOUNTER — Ambulatory Visit: Payer: 59 | Admitting: Family Medicine

## 2019-09-14 ENCOUNTER — Other Ambulatory Visit: Payer: Self-pay

## 2019-09-14 ENCOUNTER — Encounter: Payer: Self-pay | Admitting: Family Medicine

## 2019-09-14 ENCOUNTER — Ambulatory Visit: Payer: 59 | Admitting: Family Medicine

## 2019-09-14 VITALS — BP 150/101 | HR 82 | Temp 97.9°F | Resp 16 | Ht 67.5 in | Wt 177.4 lb

## 2019-09-14 DIAGNOSIS — I10 Essential (primary) hypertension: Secondary | ICD-10-CM

## 2019-09-14 MED ORDER — IRBESARTAN 150 MG PO TABS: ORAL_TABLET | ORAL | 0 refills | Status: DC

## 2019-09-14 NOTE — Progress Notes (Signed)
OFFICE VISIT  09/14/2019   CC:  Chief Complaint  Patient presents with  . Follow-up    HTN    HPI:    Patient is a 48 y.o. Caucasian female who presents for 3 week f/u HTN. A/P as of last visit: "HTN: still poor/erratic control. Suspect anxiety playing some role in her fluctuations. Continue hctz 25mg  qd and add clonidine patch 0.1mg /24h and change this q week. Therapeutic expectations and side effect profile of medication discussed today.  Patient's questions answered. Continue home bp monitoring.  Goal bp 130/80 or better consistently."  Interim hx: BP's still 140s/90s consistently.   Feels lethargic and headachy the day after she puts a clonidine patch on.    Past Medical History:  Diagnosis Date  . 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 Verlin Fester  . Anxiety    + hx of panic attacks.  Was on lexapro for a short time in remote past.  . Dyspareunia 10/10/2013  . Endometrial polyp 10/17/2013  . Essential hypertension   . Hay fever   . Hemorrhoids 10/10/2013  . History of melanoma   . Menometrorrhagia 2013   using herbal treatments and this has resolved.  . Other and unspecified ovarian cyst 10/17/2013    Past Surgical History:  Procedure Laterality Date  . CESAREAN SECTION     3  . ECTOPIC PREGNANCY SURGERY     four surgeries (left fallopian tube removed)  . SKIN CANCER EXCISION  2007   on pt's back     Outpatient Medications Prior to Visit  Medication Sig Dispense Refill  . ALPRAZolam (XANAX) 0.5 MG tablet 1 tab po bid prn anxiety 60 tablet 3  . hydrochlorothiazide (HYDRODIURIL) 25 MG tablet Take 1 tablet (25 mg total) by mouth daily. 90 tablet 3  . Olopatadine HCl (PATADAY) 0.2 % SOLN Place 1 drop into both eyes daily as needed. 1 Bottle 3  . cloNIDine (CATAPRES - DOSED IN MG/24 HR) 0.1 mg/24hr patch Place 1 patch (0.1 mg total) onto the skin once a week. 4 patch 0  . EPINEPHrine (AUVI-Q) 0.3 mg/0.3 mL IJ SOAJ injection Inject 0.3 mLs (0.3 mg total) into the muscle  as needed for anaphylaxis. (Patient not taking: Reported on 08/26/2019) 2 Device 2  . fluticasone (FLONASE) 50 MCG/ACT nasal spray Place 2 sprays into both nostrils daily as needed for allergies or rhinitis. (Patient not taking: Reported on 08/26/2019) 16 g 3   No facility-administered medications prior to visit.     Allergies  Allergen Reactions  . Penicillins Hives  . Amlodipine Rash  . Benazepril Rash  . Erythromycin Rash    ROS As per HPI  PE: Blood pressure (!) 150/101, pulse 82, temperature 97.9 F (36.6 C), temperature source Temporal, resp. rate 16, height 5' 7.5" (1.715 m), weight 177 lb 6.4 oz (80.5 kg), SpO2 96 %. Exam chaperoned by Deveron Furlong, CMA. Gen: Alert, well appearing.  Patient is oriented to person, place, time, and situation. AFFECT: pleasant, lucid thought and speech. CV: RRR, no m/r/g.  Carotids, radial, and DP pulses all symmetric. LUNGS: CTA bilat, nonlabored resps, good aeration in all lung fields. ABD: soft, NT, ND, BS normal.  No hepatospenomegaly or mass.  No bruits. EXT: no clubbing or cyanosis.  no edema.    LABS:    Chemistry      Component Value Date/Time   NA 138 06/28/2019 1208   NA 134 03/06/2017 0826   K 3.5 06/28/2019 1208   CL 102 06/28/2019 1208  CO2 27 06/28/2019 1208   BUN 9 06/28/2019 1208   BUN 11 03/06/2017 0826   CREATININE 0.82 06/28/2019 1208      Component Value Date/Time   CALCIUM 9.8 06/28/2019 1208   ALKPHOS 63 06/28/2019 1208   AST 24 06/28/2019 1208   ALT 20 06/28/2019 1208   BILITOT 0.4 06/28/2019 1208   BILITOT 0.6 03/06/2017 0826     Lab Results  Component Value Date   CHOL 205 (H) 06/28/2019   HDL 82.80 06/28/2019   LDLCALC 102 (H) 06/28/2019   TRIG 99.0 06/28/2019   CHOLHDL 2 06/28/2019   Lab Results  Component Value Date   WBC 4.4 06/28/2019   HGB 14.6 06/28/2019   HCT 44.1 06/28/2019   MCV 91.3 06/28/2019   PLT 216.0 06/28/2019   Lab Results  Component Value Date   TSH 1.53  06/28/2019     IMPRESSION AND PLAN:  Uncontrolled HTN: intol of clonidine patch.   D/c clonidine patch. Start irbesartan 150mg , increase to 300mg  if bp not at goal <130/80 (hx of rash on benazapril/amlod but she has alpha gal allergy and the capsule is made of a gelatin that could have caused her rash). We discussed the possibility of doing echo and renal artery dopplers in future IF we get her on decent bp med regimen and bp still significantly elevated.  Has GYN f/u with Dr. Glo Herring soon.  She is definitely perimenopausal.  An After Visit Summary was printed and given to the patient.  FOLLOW UP: Return for 2 wks lab visit.  office visit 3-4 wks f/u HTN.  Signed:  Crissie Sickles, MD           09/14/2019

## 2019-09-28 ENCOUNTER — Other Ambulatory Visit: Payer: Self-pay

## 2019-09-28 ENCOUNTER — Encounter: Payer: Self-pay | Admitting: Obstetrics and Gynecology

## 2019-09-28 ENCOUNTER — Other Ambulatory Visit (HOSPITAL_COMMUNITY)
Admission: RE | Admit: 2019-09-28 | Discharge: 2019-09-28 | Disposition: A | Discharge status: Home / Self Care | Payer: 59 | Source: Ambulatory Visit | Attending: Obstetrics and Gynecology | Admitting: Obstetrics and Gynecology

## 2019-09-28 ENCOUNTER — Ambulatory Visit (INDEPENDENT_AMBULATORY_CARE_PROVIDER_SITE_OTHER): Payer: 59 | Admitting: Obstetrics and Gynecology

## 2019-09-28 VITALS — BP 151/101 | HR 94 | Ht 68.0 in | Wt 178.2 lb

## 2019-09-28 DIAGNOSIS — Z01419 Encounter for gynecological examination (general) (routine) without abnormal findings: Secondary | ICD-10-CM

## 2019-09-28 NOTE — Progress Notes (Signed)
Patient ID: Melissa Harrington, female   DOB: 04-13-1971, 48 y.o.   MRN: [id number]  Assessment:  Annual Gyn Exam Alpha-gal syndrome, meat intolerance Weight gain Peri-menopause Plan:  1. pap smear done, next pap due 3 years 2. Continue BP medication 3    Will decide November 18 2019 trial of effexor instead of Lexapro, as pt is reluctant still to begin SSRI"s Subjective:  Melissa Harrington is a 48 y.o. female [id number] who presents for annual exam. No LMP recorded. The patient has complaints today of "health is crap". Since beginning of year BP has been elevated 150s/100s are normal readings. Tried 4 medicines when she seen Dr. Anitra Lauth. She had an allergic reaction to first medication due to gelatin capsule. Has only had 2 cycles this year with weight gain. Has developed an alpha-gal syndrome. She can eat chicken and fish but cannot withstand red meat. Had to be induced with both of her children due to elevated BP. Her first delivery she was induced due to pre-e. Second child after delivery BP was 200s/150s and was in recovery for a while with mag drip.  Her mother died in February 18, 2023, lung cancer recurrence.  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 She is looking to find other ways.   The following portions of the patient's history were reviewed and updated as appropriate: allergies, current medications, past family history, past medical history, past social history, past surgical history and problem list. Past Medical History:  Diagnosis Date  . 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 Verlin Fester  . Anxiety    + hx of panic attacks.  Was on lexapro for a short time in remote past.  . Dyspareunia 10/10/2013  . Endometrial polyp 10/17/2013  . Essential hypertension   . Hay fever   . Hemorrhoids 10/10/2013  . History of melanoma   . Menometrorrhagia 02/18/2012   using herbal treatments and this has resolved.  . Other and unspecified ovarian cyst  10/17/2013    Past Surgical History:  Procedure Laterality Date  . CESAREAN SECTION     3  . ECTOPIC PREGNANCY SURGERY     four surgeries (left fallopian tube removed)  . SKIN CANCER EXCISION  2006/02/17   on pt's back      Current Outpatient Medications:  .  ALPRAZolam (XANAX) 0.5 MG tablet, 1 tab po bid prn anxiety, Disp: 60 tablet, Rfl: 3 .  EPINEPHrine (AUVI-Q) 0.3 mg/0.3 mL IJ SOAJ injection, Inject 0.3 mLs (0.3 mg total) into the muscle as needed for anaphylaxis. (Patient not taking: Reported on 08/26/2019), Disp: 2 Device, Rfl: 2 .  fluticasone (FLONASE) 50 MCG/ACT nasal spray, Place 2 sprays into both nostrils daily as needed for allergies or rhinitis. (Patient not taking: Reported on 08/26/2019), Disp: 16 g, Rfl: 3 .  hydrochlorothiazide (HYDRODIURIL) 25 MG tablet, Take 1 tablet (25 mg total) by mouth daily., Disp: 90 tablet, Rfl: 3 .  irbesartan (AVAPRO) 150 MG tablet, 1 tab po qd x 10d, then increase to 2 tabs po qd, Disp: 50 tablet, Rfl: 0 .  Olopatadine HCl (PATADAY) 0.2 % SOLN, Place 1 drop into both eyes daily as needed., Disp: 1 Bottle, Rfl: 3  Review of Systems Constitutional: negative Gastrointestinal: negative Genitourinary: normal  Objective:  There were no vitals taken for this visit.   BMI: There is no height or weight on file to calculate BMI.  General Appearance: Alert, appropriate appearance for age. No  acute distress HEENT: Grossly normal Neck / Thyroid:  Cardiovascular: RRR; normal S1, S2, no murmur Lungs: CTA bilaterally Back: No CVAT Breast Exam: No masses or nodes.No dimpling, nipple retraction or discharge. Gastrointestinal: Soft, non-tender, no masses or organomegaly Pelvic Exam:  VAGINA: normal appearing vagina  CERVIX: normal appearing cervix  UTERUS: uterus is normal size  PAP: Pap smear done today. Lymphatic Exam: Non-palpable nodes in neck, clavicular, axillary, or inguinal regions Skin: no rash or abnormalities Neurologic: Normal gait and  speech, no tremor  Psychiatric: Alert and oriented, appropriate affect.  Urinalysis:Not done  By signing my name below, I, Samul Dada, attest that this documentation has been prepared under the direction and in the presence of Jonnie Kind, MD. Electronically Signed: Melvin. 09/28/19. 9:37 AM.  I personally performed the services described in this documentation, which was SCRIBED in my presence. The recorded information has been reviewed and considered accurate. It has been edited as necessary during review. Jonnie Kind, MD

## 2019-09-29 ENCOUNTER — Ambulatory Visit: Payer: 59

## 2019-09-29 LAB — CYTOLOGY - PAP
Comment: NEGATIVE
Diagnosis: NEGATIVE
High risk HPV: NEGATIVE

## 2019-10-05 ENCOUNTER — Other Ambulatory Visit: Payer: Self-pay

## 2019-10-06 ENCOUNTER — Ambulatory Visit (INDEPENDENT_AMBULATORY_CARE_PROVIDER_SITE_OTHER): Payer: 59 | Admitting: Family Medicine

## 2019-10-06 ENCOUNTER — Encounter: Payer: Self-pay | Admitting: Family Medicine

## 2019-10-06 VITALS — BP 115/83 | HR 74 | Temp 98.3°F | Resp 16 | Ht 68.0 in | Wt 179.8 lb

## 2019-10-06 DIAGNOSIS — F4323 Adjustment disorder with mixed anxiety and depressed mood: Secondary | ICD-10-CM | POA: Diagnosis not present

## 2019-10-06 DIAGNOSIS — I10 Essential (primary) hypertension: Secondary | ICD-10-CM

## 2019-10-06 DIAGNOSIS — F4321 Adjustment disorder with depressed mood: Secondary | ICD-10-CM

## 2019-10-06 LAB — BASIC METABOLIC PANEL
BUN: 12 mg/dL (ref 6–23)
CO2: 30 mEq/L (ref 19–32)
Calcium: 9.5 mg/dL (ref 8.4–10.5)
Chloride: 98 mEq/L (ref 96–112)
Creatinine, Ser: 0.78 mg/dL (ref 0.40–1.20)
GFR: 78.39 mL/min (ref >60.00)
Glucose, Bld: 98 mg/dL (ref 70–99)
Potassium: 4.4 mEq/L (ref 3.5–5.1)
Sodium: 138 mEq/L (ref 135–145)

## 2019-10-06 MED ORDER — ALPRAZOLAM 0.5 MG PO TABS: ORAL_TABLET | ORAL | 0 refills | Status: DC

## 2019-10-06 NOTE — Progress Notes (Signed)
OFFICE VISIT  10/06/2019   CC:  Chief Complaint  Patient presents with  . Follow-up    HTN, pt is fasting   HPI:    Patient is a 48 y.o. Caucasian female who presents for 3 wk f/u uncontrolled HTN. A/P as of last f/u: "Start irbesartan 150mg , increase to 300mg  if bp not at goal <130/80 (hx of rash on benazapril/amlod but she has alpha gal allergy and the capsule is made of a gelatin that could have caused her rash). We discussed the possibility of doing echo and renal artery dopplers in future IF we get her on decent bp med regimen and bp still significantly elevated."  Interim hx: Home bp's 120s/130s/70s-80s. Taking irbesartan 150 qd, hctz 25 qd. She is very happy with bp meds and control now.  Grief reaction/adjustment d/o with mixed anx/dep, + anxiety related insomnia: taking alpraz 1 tab qhs most days, some days needs 1 in daytime as well.   Has been resistant to trial of SSRI but wants to reconsider, plans on waiting until 11/18/19 to decide.  Past Medical History:  Diagnosis Date  . 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 Verlin Fester  . Allergy to alpha-gal   . Anxiety    + hx of panic attacks.  Was on lexapro for a short time in remote past.  . Dyspareunia 10/10/2013  . Endometrial polyp 10/17/2013  . Essential hypertension   . Hay fever   . Hemorrhoids 10/10/2013  . History of melanoma   . Menometrorrhagia 2013   using herbal treatments and this has resolved.  . Other and unspecified ovarian cyst 10/17/2013    Past Surgical History:  Procedure Laterality Date  . CESAREAN SECTION     3  . ECTOPIC PREGNANCY SURGERY     four surgeries (left fallopian tube removed)  . SKIN CANCER EXCISION  2007   on pt's back     Outpatient Medications Prior to Visit  Medication Sig Dispense Refill  . ALPRAZolam (XANAX) 0.5 MG tablet 1 tab po bid prn anxiety 60 tablet 3  . EPINEPHrine (AUVI-Q) 0.3 mg/0.3 mL IJ SOAJ injection Inject 0.3 mLs (0.3 mg total) into the muscle as needed for  anaphylaxis. 2 Device 2  . fluticasone (FLONASE) 50 MCG/ACT nasal spray Place 2 sprays into both nostrils daily as needed for allergies or rhinitis. 16 g 3  . hydrochlorothiazide (HYDRODIURIL) 25 MG tablet Take 1 tablet (25 mg total) by mouth daily. 90 tablet 3  . irbesartan (AVAPRO) 150 MG tablet 1 tab po qd x 10d, then increase to 2 tabs po qd 50 tablet 0  . Olopatadine HCl (PATADAY) 0.2 % SOLN Place 1 drop into both eyes daily as needed. (Patient not taking: Reported on 09/28/2019) 1 Bottle 3   No facility-administered medications prior to visit.     Allergies  Allergen Reactions  . Meat [Alpha-Gal]   . Penicillins Hives  . Amlodipine Rash  . Benazepril Rash  . Erythromycin Rash    ROS As per HPI  PE: Blood pressure 115/83, pulse 74, temperature 98.3 F (36.8 C), temperature source Temporal, resp. rate 16, height 5\' 8"  (1.727 m), weight 179 lb 12.8 oz (81.6 kg), SpO2 100 %. Gen: Alert, well appearing.  Patient is oriented to person, place, time, and situation. AFFECT: pleasant, lucid thought and speech. No further exam today.  LABS:    Chemistry      Component Value Date/Time   NA 138 06/28/2019 1208   NA 134 03/06/2017 [id number]  K 3.5 06/28/2019 1208   CL 102 06/28/2019 1208   CO2 27 06/28/2019 1208   BUN 9 06/28/2019 1208   BUN 11 03/06/2017 0826   CREATININE 0.82 06/28/2019 1208      Component Value Date/Time   CALCIUM 9.8 06/28/2019 1208   ALKPHOS 63 06/28/2019 1208   AST 24 06/28/2019 1208   ALT 20 06/28/2019 1208   BILITOT 0.4 06/28/2019 1208   BILITOT 0.6 03/06/2017 0826      IMPRESSION AND PLAN:  1) HTN: under control now.  The current medical regimen is effective;  continue present plan and medications. Check BMET today. Continue periodic home bp checks.  2) Grief response/adjustment d/o with anx/dep/insomnia->she's hanging in there, has cut back her alpraz use to one per night.  We'll continue like this and reassess possible start of SSRI (lexapro  10) in 6 wks. PMP AWARE reviewed today: most recent alpraz rx filled 10/03/19, #60, rx by me.  No red flags. Still anticipate short term use of this med so no CSC at this time.   Alpraz 0.5mg , 1 bid prn, #60, fill on/after 11/01/19 eRx'd today.   An After Visit Summary was printed and given to the patient.  FOLLOW UP: Return in about 6 weeks (around 11/17/2019) for f/u mood/anxiety.  Signed:  Crissie Sickles, MD           10/06/2019

## 2019-11-03 ENCOUNTER — Ambulatory Visit (INDEPENDENT_AMBULATORY_CARE_PROVIDER_SITE_OTHER): Payer: 59 | Admitting: Family Medicine

## 2019-11-03 ENCOUNTER — Encounter: Payer: Self-pay | Admitting: Family Medicine

## 2019-11-03 ENCOUNTER — Ambulatory Visit (HOSPITAL_COMMUNITY)
Admission: RE | Admit: 2019-11-03 | Discharge: 2019-11-03 | Disposition: A | Discharge status: Home / Self Care | Payer: 59 | Source: Ambulatory Visit | Attending: Family Medicine | Admitting: Family Medicine

## 2019-11-03 ENCOUNTER — Other Ambulatory Visit: Payer: Self-pay | Admitting: Family Medicine

## 2019-11-03 ENCOUNTER — Other Ambulatory Visit: Payer: Self-pay

## 2019-11-03 VITALS — BP 121/79 | HR 94 | Temp 98.7°F | Resp 16 | Ht 68.0 in | Wt 182.4 lb

## 2019-11-03 DIAGNOSIS — M546 Pain in thoracic spine: Secondary | ICD-10-CM

## 2019-11-03 DIAGNOSIS — M545 Low back pain, unspecified: Secondary | ICD-10-CM

## 2019-11-03 DIAGNOSIS — S0083XA Contusion of other part of head, initial encounter: Secondary | ICD-10-CM

## 2019-11-03 DIAGNOSIS — S060X0A Concussion without loss of consciousness, initial encounter: Secondary | ICD-10-CM

## 2019-11-03 DIAGNOSIS — W19XXXA Unspecified fall, initial encounter: Secondary | ICD-10-CM

## 2019-11-03 MED ORDER — HYDROCODONE-ACETAMINOPHEN 5-325 MG PO TABS: 1.0000 | ORAL_TABLET | Freq: Four times a day (QID) | ORAL | 0 refills | Status: DC | PRN

## 2019-11-03 NOTE — Progress Notes (Signed)
OFFICE VISIT  11/03/2019   CC:  Chief Complaint  Patient presents with  . Back Pain    recent fall 12/16    HPI:    Patient is a 48 y.o. Caucasian female who presents for back pain after falling down recently on icy steps on 11/02/19. Fell on brick step, slid down to bottom step (6th) and hit back of head on brick step at the end. No LOC, no blood.  Mild dizziness after.   Lots of bruising, hurts in midline of lower T spine area as well as all over L spine, sacral spine, and coccyx.  Tylenol no help.  Ibup 800 mg dose helped some but elevated bp to 140s/90s. No LE weakness, no loss of b/b control, no saddle anesthesia, no paresthesias.  ROS: no fevers, no neck stiffness, no vision or hearing c/o, no nausea/vomiting, no memory or cognitive dysfunction.  NO generalized headache, just focal pain where back of head hit step.  Past Medical History:  Diagnosis Date  . 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 Verlin Fester  . Allergy to alpha-gal   . Anxiety    + hx of panic attacks.  Was on lexapro for a short time in remote past.  . Dyspareunia 10/10/2013  . Endometrial polyp 10/17/2013  . Essential hypertension   . Hay fever   . Hemorrhoids 10/10/2013  . History of melanoma   . Menometrorrhagia 2013   using herbal treatments and this has resolved.  . Other and unspecified ovarian cyst 10/17/2013    Past Surgical History:  Procedure Laterality Date  . CESAREAN SECTION     3  . ECTOPIC PREGNANCY SURGERY     four surgeries (left fallopian tube removed)  . SKIN CANCER EXCISION  2007   on pt's back     Outpatient Medications Prior to Visit  Medication Sig Dispense Refill  . ALPRAZolam (XANAX) 0.5 MG tablet 1 tab po bid prn anxiety 60 tablet 0  . hydrochlorothiazide (HYDRODIURIL) 25 MG tablet Take 1 tablet (25 mg total) by mouth daily. 90 tablet 3  . irbesartan (AVAPRO) 150 MG tablet 1 tab po qd x 10d, then increase to 2 tabs po qd 50 tablet 0  . EPINEPHrine (AUVI-Q) 0.3 mg/0.3 mL IJ  SOAJ injection Inject 0.3 mLs (0.3 mg total) into the muscle as needed for anaphylaxis. (Patient not taking: Reported on 11/03/2019) 2 Device 2  . fluticasone (FLONASE) 50 MCG/ACT nasal spray Place 2 sprays into both nostrils daily as needed for allergies or rhinitis. (Patient not taking: Reported on 11/03/2019) 16 g 3  . Olopatadine HCl (PATADAY) 0.2 % SOLN Place 1 drop into both eyes daily as needed. (Patient not taking: Reported on 09/28/2019) 1 Bottle 3   No facility-administered medications prior to visit.    Allergies  Allergen Reactions  . Meat [Alpha-Gal]   . Penicillins Hives  . Amlodipine Rash  . Benazepril Rash  . Erythromycin Rash    ROS As per HPI  PE: Blood pressure 121/79, pulse 94, temperature 98.7 F (37.1 C), temperature source Temporal, resp. rate 16, height 5\' 8"  (1.727 m), weight 182 lb 6.4 oz (82.7 kg), SpO2 98 %. Body mass index is 27.73 kg/m. Exam chaperoned by Deveron Furlong, CMA.  Gen: Alert, well appearing.  Patient is oriented to person, place, time, and situation. AFFECT: pleasant, lucid thought and speech. Entire Lumbosacral spine, intergluteal area, and coccyx area with extensive ecchymoses and signif TTP.  No hematoma palpable.  Also some focal midline  TTP in lower T spine level. Occipital bone just to the left of midline with mild tenderness but no nodule/hematoma or fluctuance.   LABS:    Chemistry      Component Value Date/Time   NA 138 10/06/2019 1054   NA 134 03/06/2017 0826   K 4.4 10/06/2019 1054   CL 98 10/06/2019 1054   CO2 30 10/06/2019 1054   BUN 12 10/06/2019 1054   BUN 11 03/06/2017 0826   CREATININE 0.78 10/06/2019 1054      Component Value Date/Time   CALCIUM 9.5 10/06/2019 1054   ALKPHOS 63 06/28/2019 1208   AST 24 06/28/2019 1208   ALT 20 06/28/2019 1208   BILITOT 0.4 06/28/2019 1208   BILITOT 0.6 03/06/2017 0826       IMPRESSION AND PLAN:  1) Lumbosacral contusion, possible thoracic vertebrae  compression. Check x-rays of T spine and L/S spine. Ice, rest. Vicodin 5/325, 1-2 q6h prn, #42.  Therapeutic expectations and side effect profile of medication discussed today.  Patient's questions answered. No NSAIDs-->her bp goes up with these.  2) Occipital contusion: very low suspicion of skull fracture. She may have a mild concussion but no sign of intracranial injury. Discussed precautions to take for treatment of concussion. Signs/symptoms to call or return for were reviewed and pt expressed understanding.  An After Visit Summary was printed and given to the patient.  FOLLOW UP: Return for 7-8 day f/u back pain and concussion.  Signed:  Crissie Sickles, MD           11/03/2019

## 2019-11-04 ENCOUNTER — Telehealth: Payer: Self-pay | Admitting: Family Medicine

## 2019-11-04 NOTE — Telephone Encounter (Signed)
Pt calling to see if her xray results are back yet.

## 2019-11-04 NOTE — Telephone Encounter (Signed)
Patient has been advised of results.  

## 2019-11-14 ENCOUNTER — Encounter: Payer: Self-pay | Admitting: Family Medicine

## 2019-11-14 ENCOUNTER — Other Ambulatory Visit: Payer: Self-pay

## 2019-11-14 ENCOUNTER — Ambulatory Visit: Payer: 59 | Admitting: Family Medicine

## 2019-11-14 VITALS — BP 116/85 | HR 93 | Temp 98.0°F | Resp 16 | Ht 68.0 in | Wt 182.4 lb

## 2019-11-14 DIAGNOSIS — I10 Essential (primary) hypertension: Secondary | ICD-10-CM | POA: Diagnosis not present

## 2019-11-14 DIAGNOSIS — F4323 Adjustment disorder with mixed anxiety and depressed mood: Secondary | ICD-10-CM

## 2019-11-14 DIAGNOSIS — S29019D Strain of muscle and tendon of unspecified wall of thorax, subsequent encounter: Secondary | ICD-10-CM

## 2019-11-14 DIAGNOSIS — S300XXD Contusion of lower back and pelvis, subsequent encounter: Secondary | ICD-10-CM

## 2019-11-14 MED ORDER — ESCITALOPRAM OXALATE 5 MG PO TABS: 5.0000 mg | ORAL_TABLET | Freq: Every day | ORAL | 0 refills | Status: DC

## 2019-11-14 MED ORDER — IRBESARTAN 150 MG PO TABS: 150.0000 mg | ORAL_TABLET | Freq: Every day | ORAL | 3 refills | Status: DC

## 2019-11-14 NOTE — Progress Notes (Signed)
OFFICE VISIT  11/14/2019   CC:  Chief Complaint  Patient presents with  . Follow-up    back pain/concussion   HPI:    Patient is a 48 y.o. Caucasian female who presents for 10 d f/u back pain and concussion.  A/P as of last visit:  "1) Lumbosacral contusion, possible thoracic vertebrae compression. Check x-rays of T spine and L/S spine. Ice, rest. Vicodin 5/325, 1-2 q6h prn, #42.  Therapeutic expectations and side effect profile of medication discussed today.  Patient's questions answered. No NSAIDs-->her bp goes up with these.  2) Occipital contusion: very low suspicion of skull fracture. She may have a mild concussion but no sign of intracranial injury. Discussed precautions to take for treatment of concussion."  Interim hx: Reviewed x-ray results in detail with with pt today.  11/03/19 L spine x-rays showed "Degenerative disc and facet disease changes at lower lumbar spine. No acute abnormalities." T spine x-rays NORMAL.  Still hurting quite a bit. Using vicodin hs only for about 5 nights and this helped a lot. Tylenol during daytime adequate. Her dizziness and HA's have resolved.  Also, her step father died 11-28-19. She is struggling with mourning from her mom's death as well. Lots of family stress, lots going on.  Feeling down, some crying spells, anhedonia, anxious, irritable, poor concentration, energy level not normal, restless. She wants to try low dose lexapro now.  Still taking alpraz only occ.    Past Medical History:  Diagnosis Date  . 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 Verlin Fester  . Allergy to alpha-gal   . Anxiety    + hx of panic attacks.  Was on lexapro for a short time in remote past.  . Dyspareunia 10/10/2013  . Endometrial polyp 10/17/2013  . Essential hypertension   . Hay fever   . Hemorrhoids 10/10/2013  . History of melanoma   . Menometrorrhagia 2013   using herbal treatments and this has resolved.  . Other and unspecified ovarian cyst  10/17/2013    Past Surgical History:  Procedure Laterality Date  . CESAREAN SECTION     3  . ECTOPIC PREGNANCY SURGERY     four surgeries (left fallopian tube removed)  . SKIN CANCER EXCISION  2007   on pt's back     Outpatient Medications Prior to Visit  Medication Sig Dispense Refill  . ALPRAZolam (XANAX) 0.5 MG tablet 1 tab po bid prn anxiety 60 tablet 0  . hydrochlorothiazide (HYDRODIURIL) 25 MG tablet Take 1 tablet (25 mg total) by mouth daily. 90 tablet 3  . HYDROcodone-acetaminophen (NORCO/VICODIN) 5-325 MG tablet Take 1-2 tablets by mouth every 6 (six) hours as needed for moderate pain. 42 tablet 0  . irbesartan (AVAPRO) 150 MG tablet 1 tab po qd x 10d, then increase to 2 tabs po qd 50 tablet 0  . EPINEPHrine (AUVI-Q) 0.3 mg/0.3 mL IJ SOAJ injection Inject 0.3 mLs (0.3 mg total) into the muscle as needed for anaphylaxis. (Patient not taking: Reported on 11/03/2019) 2 Device 2  . fluticasone (FLONASE) 50 MCG/ACT nasal spray Place 2 sprays into both nostrils daily as needed for allergies or rhinitis. (Patient not taking: Reported on 11/03/2019) 16 g 3  . Olopatadine HCl (PATADAY) 0.2 % SOLN Place 1 drop into both eyes daily as needed. (Patient not taking: Reported on 09/28/2019) 1 Bottle 3   No facility-administered medications prior to visit.    Allergies  Allergen Reactions  . Meat [Alpha-Gal]   . Penicillins Hives  .  Amlodipine Rash  . Benazepril Rash  . Erythromycin Rash    ROS As per HPI  PE: Blood pressure 116/85, pulse 93, temperature 98 F (36.7 C), temperature source Temporal, resp. rate 16, height 5\' 8"  (1.727 m), weight 182 lb 6.4 oz (82.7 kg), SpO2 98 %.   Exam chaperoned by Caroll Rancher, LPN. Glut: diffuse ecchymoses scattered over gluts/buttocks, mild focal soft tissue swelling to right of midline at about L5-S1 level, diffusely TTP.  The focal swelling is not fluctuant, firm, or indurated.  LABS:    Chemistry      Component Value Date/Time   NA  138 10/06/2019 1054   NA 134 03/06/2017 0826   K 4.4 10/06/2019 1054   CL 98 10/06/2019 1054   CO2 30 10/06/2019 1054   BUN 12 10/06/2019 1054   BUN 11 03/06/2017 0826   CREATININE 0.78 10/06/2019 1054      Component Value Date/Time   CALCIUM 9.5 10/06/2019 1054   ALKPHOS 63 06/28/2019 1208   AST 24 06/28/2019 1208   ALT 20 06/28/2019 1208   BILITOT 0.4 06/28/2019 1208   BILITOT 0.6 03/06/2017 0826      IMPRESSION AND PLAN:  1) Diffuse contusions of glut/buttocks, plus mid thoracic strain: gradually improving. Ice, prn vicodin hs, tylenol during daytime.  Stretching.  2) Mild concussion, w/out LOC: all sx's resolved now.  3) Adjustment d/o with mixed anx/dep mood: start lexapro 5mg  qd, continue xanax prn which she is using pretty sparingly--still anticipate short term use of this med.  4) HTN: well controlled.  An After Visit Summary was printed and given to the patient.  FOLLOW UP: Return in about 4 weeks (around 12/12/2019) for f/u mood/anx--TELEMEDICINE.  Signed:  Crissie Sickles, MD           11/14/2019

## 2019-11-21 ENCOUNTER — Ambulatory Visit: Payer: 59 | Admitting: Family Medicine

## 2020-04-14 IMAGING — DX DG LUMBAR SPINE COMPLETE 4+V
5 series · 5 of 5 positions shown · non-contrast
Comparison: None

CLINICAL DATA: Slipped on black ice yesterday and fell down 6 brick
steps, severe mid to lower back pain

EXAM:
LUMBAR SPINE - COMPLETE 4+ VIEW

[l-spine ap]
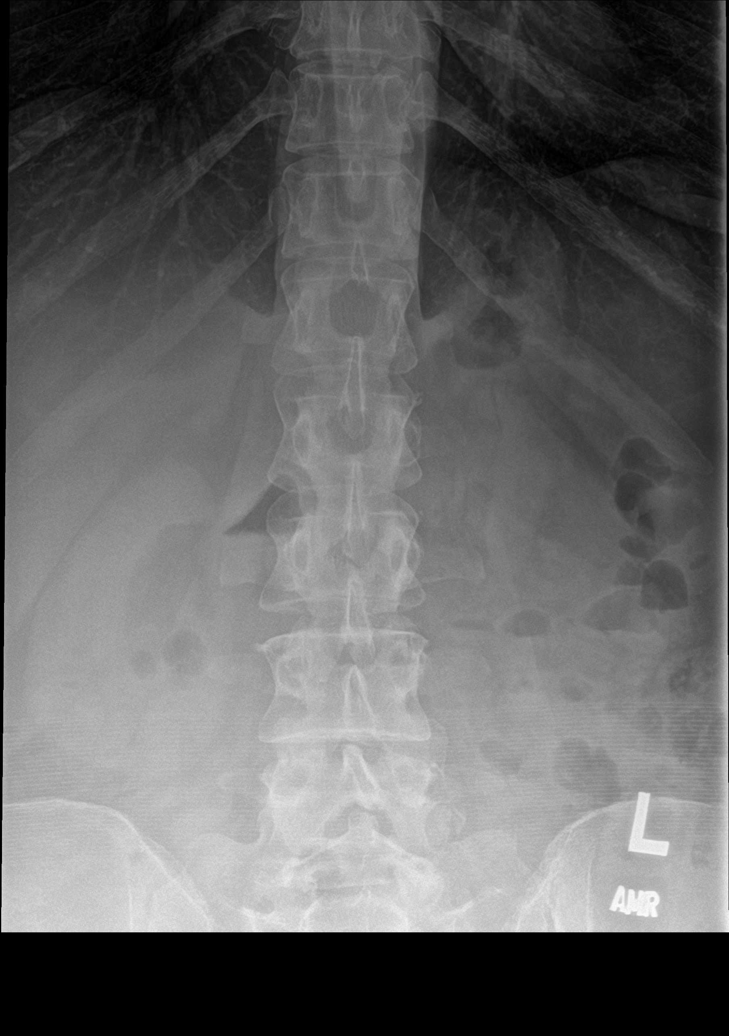

[l-spine obl (1 of 2)]
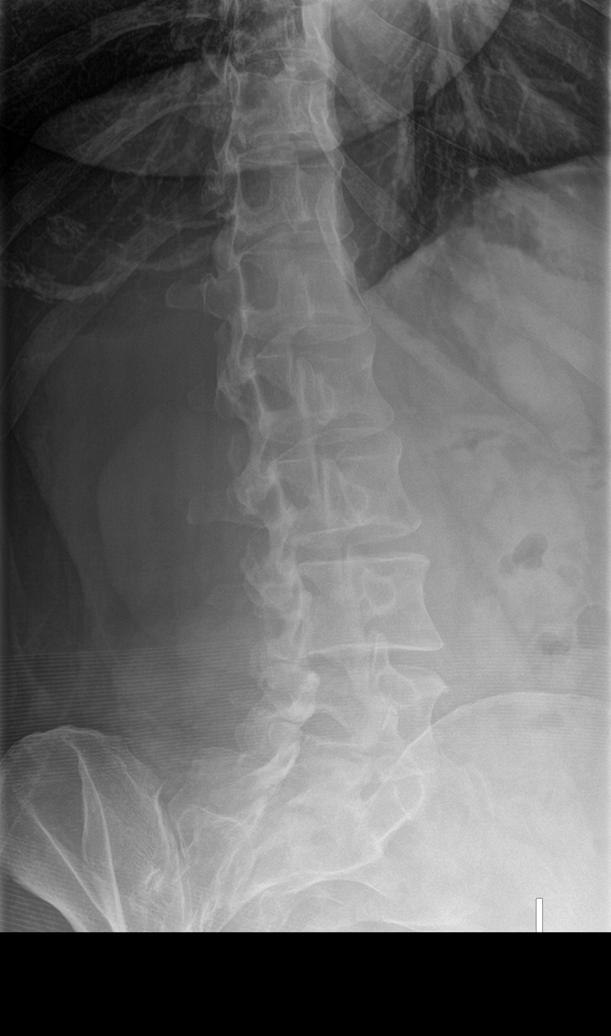

[l-spine obl (2 of 2)]
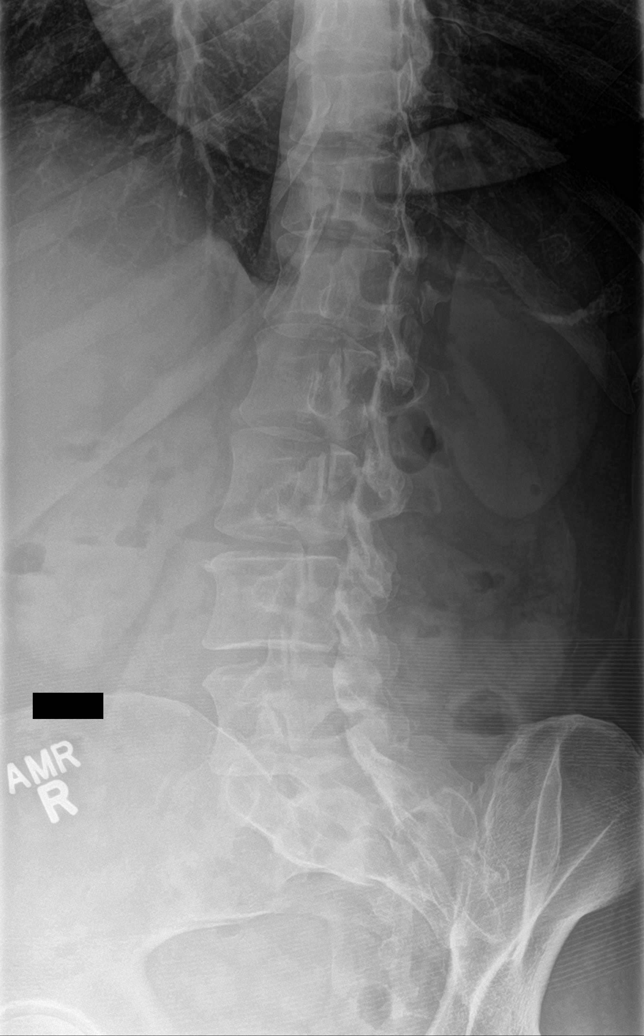

[l-spine lat]
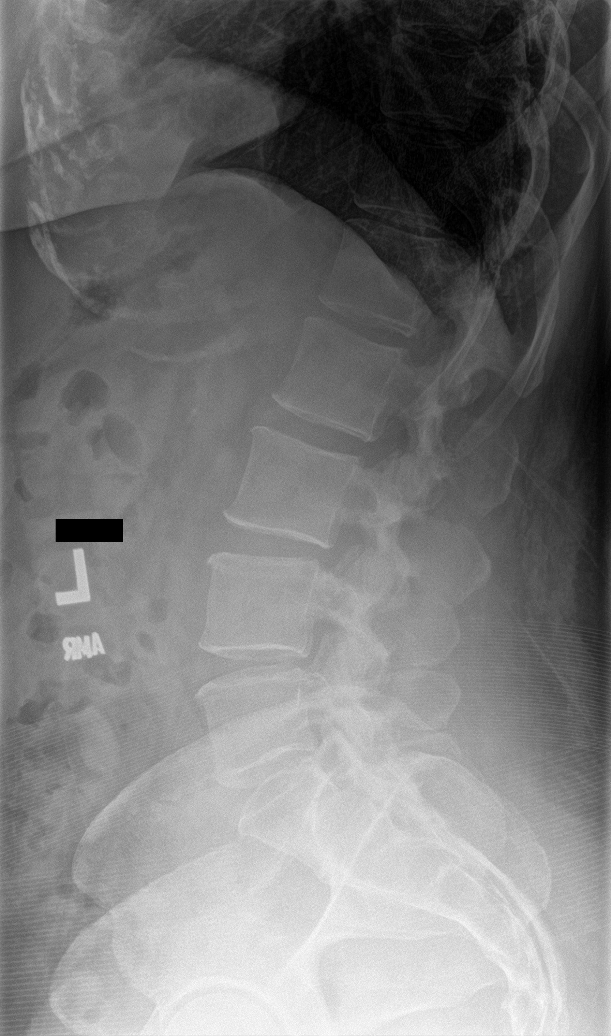

[l-spine spot]
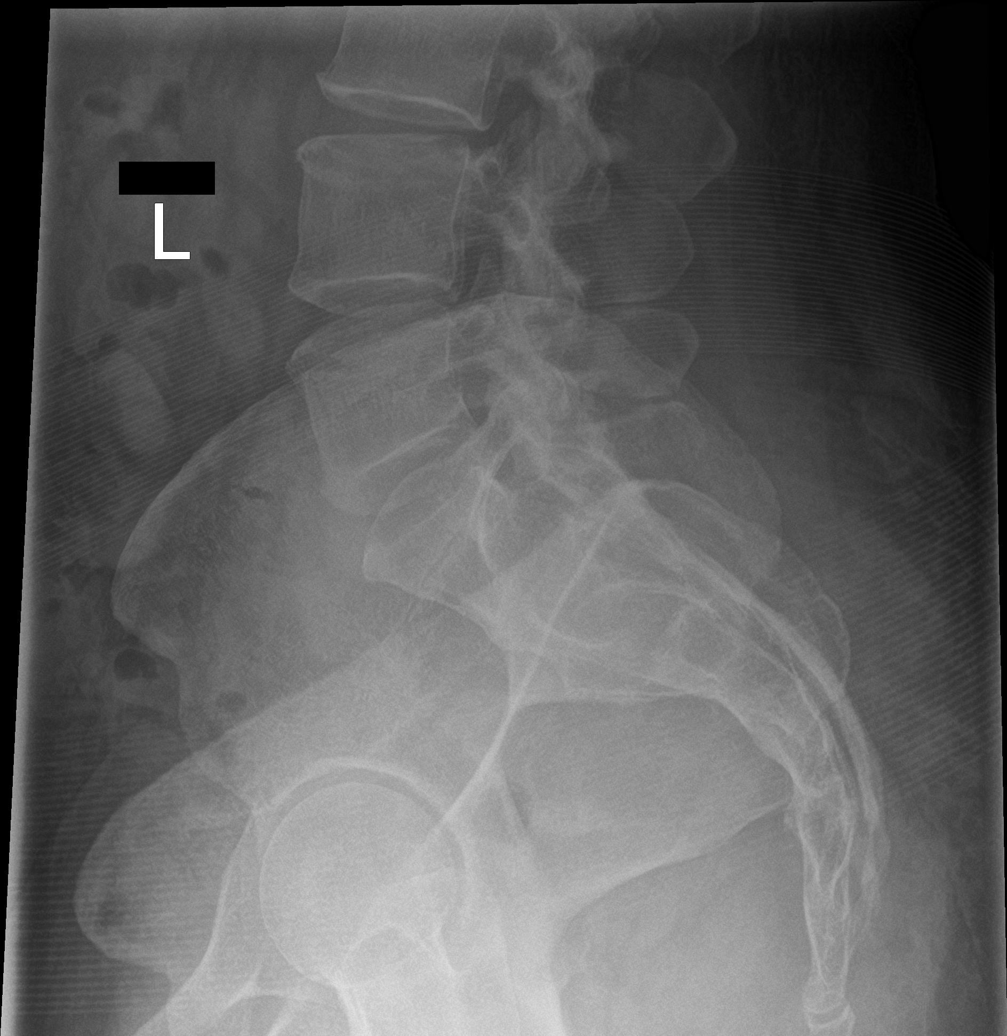

[5 of 5 positions shown; findings below may reference images not displayed]

FINDINGS: Exam performed standing, patient unable to lie flat.

Five non-rib-bearing lumbar vertebra.

Disc space narrowing L4-L5.

Vertebral body and disc space heights otherwise maintained.

No fracture, subluxation, or bone destruction.

Facet degenerative changes L4-L5 and L5-S1.

No spondylolysis.

SI joints preserved.
IMPRESSION: Degenerative disc and facet disease changes at lower lumbar spine.

No acute abnormalities.

## 2020-09-17 ENCOUNTER — Other Ambulatory Visit: Payer: Self-pay | Admitting: Family Medicine

## 2020-10-04 ENCOUNTER — Other Ambulatory Visit (HOSPITAL_COMMUNITY): Payer: Self-pay | Admitting: Obstetrics & Gynecology

## 2020-10-04 ENCOUNTER — Ambulatory Visit (INDEPENDENT_AMBULATORY_CARE_PROVIDER_SITE_OTHER): Payer: 59 | Admitting: Advanced Practice Midwife

## 2020-10-04 ENCOUNTER — Other Ambulatory Visit: Payer: Self-pay

## 2020-10-04 ENCOUNTER — Encounter: Payer: Self-pay | Admitting: Advanced Practice Midwife

## 2020-10-04 VITALS — BP 132/92 | HR 90 | Ht 68.0 in | Wt 177.0 lb

## 2020-10-04 DIAGNOSIS — Z1231 Encounter for screening mammogram for malignant neoplasm of breast: Secondary | ICD-10-CM

## 2020-10-04 DIAGNOSIS — Z1211 Encounter for screening for malignant neoplasm of colon: Secondary | ICD-10-CM | POA: Diagnosis not present

## 2020-10-04 MED ORDER — ALPRAZOLAM 0.5 MG PO TABS: ORAL_TABLET | ORAL | 0 refills | Status: DC

## 2020-10-04 MED ORDER — IRBESARTAN 150 MG PO TABS: 150.0000 mg | ORAL_TABLET | Freq: Every day | ORAL | 3 refills | Status: DC

## 2020-10-04 MED ORDER — HYDROCHLOROTHIAZIDE 25 MG PO TABS: 25.0000 mg | ORAL_TABLET | Freq: Every day | ORAL | 3 refills | Status: DC

## 2020-10-04 NOTE — Progress Notes (Signed)
Melissa Harrington 49 y.o.  Vitals:   10/04/20 1521  BP: (!) 132/92  Pulse: 90     Filed Weights   10/04/20 1521  Weight: 177 lb (80.3 kg)    Past Medical History: Past Medical History:  Diagnosis Date  . 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 Verlin Fester  . Allergy to alpha-gal   . Anxiety    + hx of panic attacks.  Was on lexapro for a short time in remote past.  . Dyspareunia 10/10/2013  . Endometrial polyp 10/17/2013  . Essential hypertension   . Hay fever   . Hemorrhoids 10/10/2013  . History of melanoma   . Menometrorrhagia 2013   using herbal treatments and this has resolved.  . Other and unspecified ovarian cyst 10/17/2013    Past Surgical History: Past Surgical History:  Procedure Laterality Date  . CESAREAN SECTION     3  . ECTOPIC PREGNANCY SURGERY     four surgeries (left fallopian tube removed)  . SKIN CANCER EXCISION  2007   on pt's back     Family History: Family History  Problem Relation Age of Onset  . Lung cancer Mother 21       Former smoker  . Mental illness Father   . Alcohol abuse Father   . Hypertension Father   . Heart disease Maternal Grandmother   . Alcohol abuse Maternal Grandmother   . Alcohol abuse Paternal Grandfather   . Alcohol abuse Paternal Grandmother   . Alcohol abuse Maternal Grandfather   . Allergic rhinitis Neg Hx   . Asthma Neg Hx   . Eczema Neg Hx   . Urticaria Neg Hx     Social History: Social History   Tobacco Use  . Smoking status: Never Smoker  . Smokeless tobacco: Never Used  Vaping Use  . Vaping Use: Never used  Substance Use Topics  . Alcohol use: Yes    Alcohol/week: 2.0 standard drinks    Types: 2 Glasses of wine per week    Comment: occ  . Drug use: No    Allergies:  Allergies  Allergen Reactions  . Meat [Alpha-Gal]   . Penicillins Hives  . Amlodipine Rash  . Benazepril Rash  . Erythromycin Rash      Current Outpatient Medications:  .  irbesartan (AVAPRO) 150 MG tablet, 1 tab po qd x 10d,  then increase to 2 tabs po qd, Disp: 50 tablet, Rfl: 0 .  ALPRAZolam (XANAX) 0.5 MG tablet, 1 tab po bid prn anxiety, Disp: 60 tablet, Rfl: 0 .  EPINEPHrine (AUVI-Q) 0.3 mg/0.3 mL IJ SOAJ injection, Inject 0.3 mLs (0.3 mg total) into the muscle as needed for anaphylaxis. (Patient not taking: Reported on 11/03/2019), Disp: 2 Device, Rfl: 2 .  escitalopram (LEXAPRO) 5 MG tablet, Take 1 tablet (5 mg total) by mouth daily. (Patient not taking: Reported on 10/04/2020), Disp: 30 tablet, Rfl: 0 .  fluticasone (FLONASE) 50 MCG/ACT nasal spray, Place 2 sprays into both nostrils daily as needed for allergies or rhinitis. (Patient not taking: Reported on 11/03/2019), Disp: 16 g, Rfl: 3 .  hydrochlorothiazide (HYDRODIURIL) 25 MG tablet, Take 1 tablet (25 mg total) by mouth daily., Disp: 90 tablet, Rfl: 3 .  HYDROcodone-acetaminophen (NORCO/VICODIN) 5-325 MG tablet, Take 1-2 tablets by mouth every 6 (six) hours as needed for moderate pain. (Patient not taking: Reported on 10/04/2020), Disp: 42 tablet, Rfl: 0 .  irbesartan (AVAPRO) 150 MG tablet, Take 1 tablet (150 mg total) by mouth  daily., Disp: 90 tablet, Rfl: 3 .  Olopatadine HCl (PATADAY) 0.2 % SOLN, Place 1 drop into both eyes daily as needed. (Patient not taking: Reported on 09/28/2019), Disp: 1 Bottle, Rfl: 3  History of Present Illness: Here for physical, last pap 2020, normal.  Needs medication refills of bp meds/prn xanax.  States that pharmacy sent requests to PCP but refills weren't provided (unsure why), and will run out of meds before she can get in to see him.   Understands that xanax will need to be a one time refill.  Has night sweats, so difficulty sleeping.  LMP Feb 2021, assumes she is menopausal.  Declines estrogen.  Stopped taking Lexapro, doesn't plan to take SSRIs any more.  Also declines therapy referral.  Has never had a mammogram ("I just have never made an appointment."). Daughter is away at college in the [place], having a hard time w/her  absence.  Has good spousal support.    Review of Systems   Patient denies any headaches, blurred vision, shortness of breath, chest pain, abdominal pain, problems with bowel movements, urination, or intercourse.   Physical Exam: General:  Well developed, well nourished, no acute distress Skin:  Warm and dry Neck:  Midline trachea, normal thyroid Lungs; Clear to auscultation bilaterally Breast:  No dominant palpable mass, retraction, or nipple discharge Cardiovascular: Regular rate and rhythm Abdomen:  Soft, non tender, no hepatosplenomegaly Pelvic:  declined  Extremities:  No swelling or varicosities noted    Impression: Normal exam     Plan:  Pap due 2 years Mammogram appt made for 12/15 (plans to go) colonscopy referral made Try pycnogenol for hot flashes/night sweats Refilled  Meds ordered this encounter  Medications  . hydrochlorothiazide (HYDRODIURIL) 25 MG tablet    Sig: Take 1 tablet (25 mg total) by mouth daily.    Dispense:  90 tablet    Refill:  3    Order Specific Question:   Supervising Provider    Answer:   Elonda Husky, LUTHER H [[id number]]  . ALPRAZolam (XANAX) 0.5 MG tablet    Sig: 1 tab po bid prn anxiety    Dispense:  60 tablet    Refill:  0    Fill on/after 11/01/19 and patient will call to request rx fill    Order Specific Question:   Supervising Provider    Answer:   Tania Ade H [[id number]]  . irbesartan (AVAPRO) 150 MG tablet    Sig: Take 1 tablet (150 mg total) by mouth daily.    Dispense:  90 tablet    Refill:  3    Order Specific Question:   Supervising Provider    Answer:   Tania Ade H [[id number]]

## 2020-10-04 NOTE — Patient Instructions (Addendum)
Pycnogenol  150mg   [employer] on [employer]  Mammogram 10:15 on 10/31/20  --[facility]--enter through front entrance

## 2020-10-09 ENCOUNTER — Telehealth: Payer: Self-pay | Admitting: Family Medicine

## 2020-10-09 NOTE — Telephone Encounter (Signed)
Spoke with pt regarding following request and question regarding COVID.

## 2020-10-09 NOTE — Telephone Encounter (Signed)
Test was at [facility]. Pt states only sx she has is congestion.

## 2020-10-09 NOTE — Telephone Encounter (Signed)
Patient states she had positive rapid covid test. She would like to ask how accurate the rapid test is. Her daughter is scheduled to come home from college for the holidays and she wonders if it is safe for her to do so.

## 2020-10-09 NOTE — Telephone Encounter (Signed)
The test is fairly accurate. However, I encourage her to still let her daughter come home and visit!! If she gets covid she will get over it fine!

## 2020-10-15 ENCOUNTER — Encounter: Payer: Self-pay | Admitting: Internal Medicine

## 2020-10-30 ENCOUNTER — Other Ambulatory Visit: Payer: Self-pay

## 2020-10-30 ENCOUNTER — Ambulatory Visit (INDEPENDENT_AMBULATORY_CARE_PROVIDER_SITE_OTHER): Payer: Self-pay | Admitting: *Deleted

## 2020-10-30 VITALS — Ht 68.5 in | Wt 176.6 lb

## 2020-10-30 DIAGNOSIS — Z1211 Encounter for screening for malignant neoplasm of colon: Secondary | ICD-10-CM

## 2020-10-30 MED ORDER — NA SULFATE-K SULFATE-MG SULF 17.5-3.13-1.6 GM/177ML PO SOLN: 1.0000 | Freq: Once | ORAL | 0 refills | Status: AC

## 2020-10-30 NOTE — Progress Notes (Addendum)
Gastroenterology Pre-Procedure Review  Request Date: 10/30/2020 Requesting Physician: Nigel Berthold @ [facility], no previous TCS  PATIENT REVIEW QUESTIONS: The patient responded to the following health history questions as indicated:    1. Diabetes Melitis: no 2. Joint replacements in the past 12 months: no 3. Major health problems in the past 3 months: yes, b/p controlled by PCP 4. Has an artificial valve or MVP: no 5. Has a defibrillator: no 6. Has been advised in past to take antibiotics in advance of a procedure like teeth cleaning: no 7. Family history of colon cancer: no  8. Alcohol Use: yes, 2 glasses of wine on weekends 9. Illicit drug Use: no 10. History of sleep apnea: no  11. History of coronary artery or other vascular stents placed within the last 12 months: no 12. History of any prior anesthesia complications: yes, n/v 13. Body mass index is 26.46 kg/m.    MEDICATIONS & ALLERGIES:    Patient reports the following regarding taking any blood thinners:   Plavix? no Aspirin? no Coumadin? no Brilinta? no Xarelto? no Eliquis? no Pradaxa? no Savaysa? no Effient? no  Patient confirms/reports the following medications:  Current Outpatient Medications  Medication Sig Dispense Refill  . ALPRAZolam (XANAX) 0.5 MG tablet 1 tab po bid prn anxiety (Patient taking differently: as needed. 1 tab po bid prn anxiety) 60 tablet 0  . EPINEPHrine (AUVI-Q) 0.3 mg/0.3 mL IJ SOAJ injection Inject 0.3 mLs (0.3 mg total) into the muscle as needed for anaphylaxis. 2 Device 2  . hydrochlorothiazide (HYDRODIURIL) 25 MG tablet Take 1 tablet (25 mg total) by mouth daily. 90 tablet 3  . irbesartan (AVAPRO) 150 MG tablet Take 1 tablet (150 mg total) by mouth daily. 90 tablet 3   No current facility-administered medications for this visit.    Patient confirms/reports the following allergies:  Allergies  Allergen Reactions  . Penicillins Hives  . Amlodipine Rash  . Benazepril  Rash  . Erythromycin Rash    No orders of the defined types were placed in this encounter.   [place] INFORMATION Primary Insurance: [facility],  [place] #: [id number],  Group #: [id number] Pre-Cert / Josem Kaufmann required: Yes, approved per [place] for 12/27/5619- 3/0/8657 Pre-Cert / Josem Kaufmann #: [id number]  SCHEDULE INFORMATION: Procedure has been scheduled as follows:  Date: 11/19/2020, Time: 8:45  Location: [facility] with Dr. Abbey Chatters  This Gastroenterology Pre-Precedure Review Form is being routed to the following provider(s): Aliene Altes, PA

## 2020-10-30 NOTE — Progress Notes (Signed)
Ok to schedule.  ASA II 

## 2020-10-30 NOTE — Patient Instructions (Signed)
Melissa Harrington  03-12-1971 MRN: [id number]     Procedure Date: 11/19/2020 Time to register: 7:15 am Place to register: [facility] Procedure Time: 8:45 am Scheduled provider: Dr. Abbey Chatters    PREPARATION FOR COLONOSCOPY WITH SUPREP BOWEL PREP KIT  Note: Suprep Bowel Prep Kit is a split-dose (2day) regimen. Consumption of BOTH 6-ounce bottles is required for a complete prep.  Please notify us immediately if you are diabetic, take iron supplements, or if you are on Coumadin or any other blood thinners.  Please hold the following medications: n/a                                                                                                                                                  2 DAYS BEFORE PROCEDURE:  DATE: 11/17/2020   DAY: Saturday Begin clear liquid diet AFTER your lunch meal. NO SOLID FOODS after this point.  1 DAY BEFORE PROCEDURE:  DATE: 11/18/2020   DAY: Sunday Continue clear liquids the entire day - NO SOLID FOOD.   Diabetic medications adjustments for today: n/a  At 6:00pm: Complete steps 1 through 4 below, using ONE (1) 6-ounce bottle, before going to bed. Step 1:  Pour ONE (1) 6-ounce bottle of SUPREP liquid into the mixing container.  Step 2:  Add cool drinking water to the 16 ounce line on the container and mix.  Note: Dilute the solution concentrate as directed prior to use. Step 3:  DRINK ALL the liquid in the container. Step 4:  You MUST drink an additional two (2) or more 16 ounce containers of water over the next one (1) hour.   Continue clear liquids.  DAY OF PROCEDURE:   DATE: 11/19/2020 DAY: Monday If you take medications for your heart, blood pressure, or breathing, you may take these medications.  Diabetic medications adjustments for today: n/a  5 hours before your procedure at :  3:45 am Step 1:  Pour ONE (1) 6-ounce bottle of SUPREP liquid into the mixing container.  Step 2:  Add cool drinking water to the 16 ounce line on the container  and mix.  Note: Dilute the solution concentrate as directed prior to use. Step 3:  DRINK ALL the liquid in the container. Step 4:  You MUST drink an additional two (2) or more 16 ounce containers of water over the next one (1) hour. You MUST complete the final glass of water at least 3 hours before your colonoscopy. Nothing by mouth past 5:45 am.  You may take your morning medications with sip of water unless we have instructed otherwise.    Please see below for Dietary Information.  CLEAR LIQUIDS INCLUDE:  Water Jello (NOT red in color)   Ice Popsicles (NOT red in color)   Tea (sugar ok, no milk/cream) Powdered fruit flavored drinks  Coffee (sugar ok, no  milk/cream) Gatorade/ Lemonade/ Kool-Aid  (NOT red in color)   Juice: apple, white grape, white cranberry Soft drinks  Clear bullion, consomme, broth (fat free beef/chicken/vegetable)  Carbonated beverages (any kind)  Strained chicken noodle soup Hard Candy   Remember: Clear liquids are liquids that will allow you to see your fingers on the other side of a clear glass. Be sure liquids are NOT red in color, and not cloudy, but CLEAR.  DO NOT EAT OR DRINK ANY OF THE FOLLOWING:  Dairy products of any kind   Cranberry juice Tomato juice / V8 juice   Grapefruit juice Orange juice     Red grape juice  Do not eat any solid foods, including such foods as: cereal, oatmeal, yogurt, fruits, vegetables, creamed soups, eggs, bread, crackers, pureed foods in a blender, etc.   HELPFUL HINTS FOR DRINKING PREP SOLUTION:   Make sure prep is extremely cold. Mix and refrigerate the the morning of the prep. You may also put in the freezer.   You may try mixing some Crystal Light or Country Time Lemonade if you prefer. Mix in small amounts; add more if necessary.  Try drinking through a straw  Rinse mouth with water or a mouthwash between glasses, to remove after-taste.  Try sipping on a cold beverage /ice/ popsicles between glasses of  prep.  Place a piece of sugar-free hard candy in mouth between glasses.  If you become nauseated, try consuming smaller amounts, or stretch out the time between glasses. Stop for 30-60 minutes, then slowly start back drinking.     OTHER INSTRUCTIONS  You will need a responsible adult at least 50 years of age to accompany you and drive you home. This person must remain in the waiting room during your procedure. The hospital will cancel your procedure if you do not have a responsible adult with you.   1. Wear loose fitting clothing that is easily removed. 2. Leave jewelry and other valuables at home.  3. Remove all body piercing jewelry and leave at home. 4. Total time from sign-in until discharge is approximately 2-3 hours. 5. You should go home directly after your procedure and rest. You can resume normal activities the day after your procedure. 6. The day of your procedure you should not:  Drive  Make legal decisions  Operate machinery  Drink alcohol  Return to work   You may call the office (Dept: [phone]) before 5:00pm, or page the doctor on call [phone]) after 5:00pm, for further instructions, if necessary.   Insurance Information YOU WILL NEED TO CHECK WITH YOUR INSURANCE COMPANY FOR THE BENEFITS OF COVERAGE YOU HAVE FOR THIS PROCEDURE.  UNFORTUNATELY, NOT ALL INSURANCE COMPANIES HAVE BENEFITS TO COVER ALL OR PART OF THESE TYPES OF PROCEDURES.  IT IS YOUR RESPONSIBILITY TO CHECK YOUR BENEFITS, HOWEVER, WE WILL BE GLAD TO ASSIST YOU WITH ANY CODES YOUR INSURANCE COMPANY MAY NEED.    PLEASE NOTE THAT MOST INSURANCE COMPANIES WILL NOT COVER A SCREENING COLONOSCOPY FOR PEOPLE UNDER THE AGE OF 50  IF YOU HAVE BCBS INSURANCE, YOU MAY HAVE BENEFITS FOR A SCREENING COLONOSCOPY BUT IF POLYPS ARE FOUND THE DIAGNOSIS WILL CHANGE AND THEN YOU MAY HAVE A DEDUCTIBLE THAT WILL NEED TO BE MET. SO PLEASE MAKE SURE YOU CHECK YOUR BENEFITS FOR A SCREENING COLONOSCOPY AS WELL AS A  DIAGNOSTIC COLONOSCOPY.

## 2020-10-31 ENCOUNTER — Other Ambulatory Visit: Payer: Self-pay | Admitting: *Deleted

## 2020-10-31 ENCOUNTER — Ambulatory Visit (HOSPITAL_COMMUNITY): Payer: 59

## 2020-11-06 ENCOUNTER — Encounter: Payer: Self-pay | Admitting: Family Medicine

## 2020-11-06 ENCOUNTER — Telehealth (INDEPENDENT_AMBULATORY_CARE_PROVIDER_SITE_OTHER): Payer: 59 | Admitting: Family Medicine

## 2020-11-06 ENCOUNTER — Other Ambulatory Visit: Payer: Self-pay

## 2020-11-06 VITALS — BP 171/110 | HR 100 | Temp 98.6°F

## 2020-11-06 DIAGNOSIS — I10 Essential (primary) hypertension: Secondary | ICD-10-CM

## 2020-11-06 DIAGNOSIS — J111 Influenza due to unidentified influenza virus with other respiratory manifestations: Secondary | ICD-10-CM

## 2020-11-06 DIAGNOSIS — R062 Wheezing: Secondary | ICD-10-CM

## 2020-11-06 DIAGNOSIS — J019 Acute sinusitis, unspecified: Secondary | ICD-10-CM | POA: Diagnosis not present

## 2020-11-06 MED ORDER — PREDNISONE 20 MG PO TABS: ORAL_TABLET | ORAL | 0 refills | Status: DC

## 2020-11-06 MED ORDER — OSELTAMIVIR PHOSPHATE 75 MG PO CAPS: 75.0000 mg | ORAL_CAPSULE | Freq: Two times a day (BID) | ORAL | 0 refills | Status: AC

## 2020-11-06 MED ORDER — IRBESARTAN 300 MG PO TABS: 300.0000 mg | ORAL_TABLET | Freq: Every day | ORAL | 3 refills | Status: DC

## 2020-11-06 MED ORDER — CEPHALEXIN 500 MG PO CAPS: 500.0000 mg | ORAL_CAPSULE | Freq: Three times a day (TID) | ORAL | 0 refills | Status: DC

## 2020-11-06 MED ORDER — ALBUTEROL SULFATE HFA 108 (90 BASE) MCG/ACT IN AERS: INHALATION_SPRAY | RESPIRATORY_TRACT | 0 refills | Status: DC

## 2020-11-06 NOTE — Progress Notes (Signed)
Virtual Visit via Video Note  I connected with pt on 11/06/20 at 10:00 AM EST by a video enabled telemedicine application and verified that I am speaking with the correct person using two identifiers.  Location patient: home, [place] Location provider:work or home office Persons participating in the virtual visit: patient, provider  I discussed the limitations of evaluation and management by telemedicine and the availability of in person appointments. The patient expressed understanding and agreed to proceed.  Telemedicine visit is a necessity given the COVID-19 restrictions in place at the current time.  HPI: 49 y/o WF being seen today for cough. Onset 5-6 days ago, tired and nasal cong and cough. Tm 100. Seems worse last couple days, esp sinus/nasal mucous, etc.  NO SOB.  Lots of coughing when trying to take deep breaths.  +HA and face and ear pains/pressure.  Achy in body. Tylenol, sinus rinse, mucinex.  Daughter with similar illness recently-covid NEG.  Pt states she tested pos for covid 10/13/20--was tired and achy but no other sx's. Quarantined appropriately, all sx's resolved.  BP up today: 170/110, HR 100 Question of whether or not she took her bp med yesterday.  She did take robitussin with phenylephrine 2-3d ago but not since. 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  Taking hctz 25 qd and irbesart 300 qd.  covid vaccine status : not vaccinated Flu vaccine status: not vaccinated.  ROS: no n/v/d/abd pain/rash or chest pain No palpitations or dizziness. ROS: See pertinent positives and negatives per HPI.  Past Medical History:  Diagnosis Date  . 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 Verlin Fester  . Anxiety    + hx of panic attacks.  Was on lexapro for a short time in remote past.  . Dyspareunia 10/10/2013  . Endometrial polyp 10/17/2013  . Essential  hypertension   . Hay fever   . Hemorrhoids 10/10/2013  . History of melanoma   . Menometrorrhagia 2013   using herbal treatments and this has resolved.  . Other and unspecified ovarian cyst 10/17/2013    Past Surgical History:  Procedure Laterality Date  . CESAREAN SECTION     3  . ECTOPIC PREGNANCY SURGERY     four surgeries (left fallopian tube removed)  . SKIN CANCER EXCISION  2007   on pt's back      Current Outpatient Medications:  .  ALPRAZolam (XANAX) 0.5 MG tablet, 1 tab po bid prn anxiety (Patient taking differently: as needed. 1 tab po bid prn anxiety), Disp: 60 tablet, Rfl: 0 .  hydrochlorothiazide (HYDRODIURIL) 25 MG tablet, Take 1 tablet (25 mg total) by mouth daily., Disp: 90 tablet, Rfl: 3 .  irbesartan (AVAPRO) 150 MG tablet, Take 1 tablet (150 mg total) by mouth daily., Disp: 90 tablet, Rfl: 3 .  EPINEPHrine (AUVI-Q) 0.3 mg/0.3 mL IJ SOAJ injection, Inject 0.3 mLs (0.3 mg total) into the muscle as needed for anaphylaxis. (Patient not taking: Reported on 11/06/2020), Disp: 2 Device, Rfl: 2 .  SUPREP BOWEL PREP KIT 17.5-3.13-1.6 GM/177ML SOLN, Take by mouth. (Patient not taking: Reported on 11/06/2020), Disp: , Rfl:   EXAM:  VITALS per patient if applicable:  Vitals with BMI 11/06/2020 10/30/2020 10/04/2020  Height - 5' 8.5" $Remov'5\' 8"'pUoBJa$   Weight - 176 lbs 10 oz 177 lbs  BMI - 83.41 96.22  Systolic 297 - 989  Diastolic 142 - 92  Pulse 395 - 90     GENERAL: alert, oriented, appears tired but is in no acute distress She coughs frequently, dry, often when taking breath between sentences.  HEENT: atraumatic, conjunttiva clear, no obvious abnormalities on inspection of external nose and ears  NECK: normal movements of the head and neck  LUNGS: on inspection no signs of respiratory distress, breathing rate appears normal, no obvious gross SOB, gasping or wheezing  CV: no obvious cyanosis  MS: moves all visible extremities without noticeable  abnormality  PSYCH/NEURO: pleasant and cooperative, no obvious depression or anxiety, speech and thought processing grossly intact  LABS: none today    Chemistry      Component Value Date/Time   NA 138 10/06/2019 1054   NA 134 03/06/2017 0826   K 4.4 10/06/2019 1054   CL 98 10/06/2019 1054   CO2 30 10/06/2019 1054   BUN 12 10/06/2019 1054   BUN 11 03/06/2017 0826   CREATININE 0.78 10/06/2019 1054      Component Value Date/Time   CALCIUM 9.5 10/06/2019 1054   ALKPHOS 63 06/28/2019 1208   AST 24 06/28/2019 1208   ALT 20 06/28/2019 1208   BILITOT 0.4 06/28/2019 1208   BILITOT 0.6 03/06/2017 0826      ASSESSMENT AND PLAN:  Discussed the following assessment and plan:  1) Influenza-like illness. Describes mild "double sickening" and signif sinus sx's PLUS has sx's suggestive of RAD. Will treat aggressively with tamiflu $RemoveBefo'75mg'VCIffwKohYt$  bid x 5d, keflex 500 mg tid x 7d, prednisone $RemoveBefore'40mg'KGwMTiGtbhMWU$  qd x 5d, and albuterol HFA 2p q4h prn. Cont saline nasal rinse. NO DECONGESTANTS. Rest, hydrate, quarantine.  2) Uncontrolled HTN: increase irbesartan to 300 mg qd, new rx sent today. Cont hctz $RemoveB'25mg'jefHAKMB$  qd. Cont home bp monitoring, call or return if not at goal of 130/80 in about 2 wks or so OR if bp not improving any in the next 1 wk.  I discussed the assessment and treatment plan with the patient. The patient was provided an opportunity to ask questions and all were answered. The patient agreed with the plan and demonstrated an understanding of the instructions.   F/u: if not improving signif in 3-4 d  Signed:  Crissie Sickles, MD           11/06/2020

## 2020-11-15 ENCOUNTER — Other Ambulatory Visit (HOSPITAL_COMMUNITY)
Admission: RE | Admit: 2020-11-15 | Discharge: 2020-11-15 | Disposition: A | Discharge status: Home / Self Care | Payer: 59 | Source: Ambulatory Visit | Attending: Internal Medicine | Admitting: Internal Medicine

## 2020-11-15 ENCOUNTER — Other Ambulatory Visit: Payer: Self-pay

## 2020-11-15 DIAGNOSIS — Z20822 Contact with and (suspected) exposure to covid-19: Secondary | ICD-10-CM | POA: Diagnosis not present

## 2020-11-15 DIAGNOSIS — Z01812 Encounter for preprocedural laboratory examination: Secondary | ICD-10-CM | POA: Insufficient documentation

## 2020-11-15 LAB — BASIC METABOLIC PANEL
Anion gap: 13 (ref 5–15)
BUN: 11 mg/dL (ref 6–20)
CO2: 27 mmol/L (ref 22–32)
Calcium: 9.5 mg/dL (ref 8.9–10.3)
Chloride: 95 mmol/L — ABNORMAL LOW (ref 98–111)
Creatinine, Ser: 0.77 mg/dL (ref 0.44–1.00)
GFR, Estimated: >60 mL/min (ref >60)
Glucose, Bld: 162 mg/dL — ABNORMAL HIGH (ref 70–99)
Potassium: 3.5 mmol/L (ref 3.5–5.1)
Sodium: 135 mmol/L (ref 135–145)

## 2020-11-15 LAB — SARS CORONAVIRUS 2 (TAT 6-24 HRS): SARS Coronavirus 2: NEGATIVE

## 2020-11-19 ENCOUNTER — Encounter (HOSPITAL_COMMUNITY)
Admission: RE | Disposition: A | Discharge status: Home / Self Care | Payer: Self-pay | Source: Home / Self Care | Attending: Internal Medicine

## 2020-11-19 ENCOUNTER — Other Ambulatory Visit: Payer: Self-pay

## 2020-11-19 ENCOUNTER — Ambulatory Visit (HOSPITAL_COMMUNITY)
Admission: RE | Admit: 2020-11-19 | Discharge: 2020-11-19 | Disposition: A | Discharge status: Home / Self Care | Payer: 59 | Attending: Internal Medicine | Admitting: Internal Medicine

## 2020-11-19 ENCOUNTER — Ambulatory Visit (HOSPITAL_COMMUNITY): Payer: 59 | Admitting: Anesthesiology

## 2020-11-19 ENCOUNTER — Encounter (HOSPITAL_COMMUNITY): Payer: Self-pay

## 2020-11-19 DIAGNOSIS — K648 Other hemorrhoids: Secondary | ICD-10-CM | POA: Insufficient documentation

## 2020-11-19 DIAGNOSIS — Z888 Allergy status to other drugs, medicaments and biological substances status: Secondary | ICD-10-CM | POA: Insufficient documentation

## 2020-11-19 DIAGNOSIS — K635 Polyp of colon: Secondary | ICD-10-CM | POA: Diagnosis not present

## 2020-11-19 DIAGNOSIS — Z881 Allergy status to other antibiotic agents status: Secondary | ICD-10-CM | POA: Diagnosis not present

## 2020-11-19 DIAGNOSIS — D125 Benign neoplasm of sigmoid colon: Secondary | ICD-10-CM | POA: Insufficient documentation

## 2020-11-19 DIAGNOSIS — Z1211 Encounter for screening for malignant neoplasm of colon: Secondary | ICD-10-CM | POA: Diagnosis not present

## 2020-11-19 DIAGNOSIS — Z880 Allergy status to penicillin: Secondary | ICD-10-CM | POA: Diagnosis not present

## 2020-11-19 DIAGNOSIS — K644 Residual hemorrhoidal skin tags: Secondary | ICD-10-CM | POA: Diagnosis not present

## 2020-11-19 DIAGNOSIS — Z79899 Other long term (current) drug therapy: Secondary | ICD-10-CM | POA: Insufficient documentation

## 2020-11-19 HISTORY — DX: Other specified postprocedural states: Z98.890

## 2020-11-19 HISTORY — PX: COLONOSCOPY: SHX174

## 2020-11-19 HISTORY — PX: COLONOSCOPY WITH PROPOFOL: SHX5780

## 2020-11-19 HISTORY — PX: POLYPECTOMY: SHX5525

## 2020-11-19 HISTORY — DX: Nausea with vomiting, unspecified: R11.2

## 2020-11-19 LAB — PREGNANCY, URINE: Preg Test, Ur: NEGATIVE

## 2020-11-19 SURGERY — COLONOSCOPY WITH PROPOFOL
Anesthesia: General

## 2020-11-19 MED ORDER — STERILE WATER FOR IRRIGATION IR SOLN
Status: DC | PRN
  Administered 2020-11-19: 1.5 mL

## 2020-11-19 MED ORDER — CHLORHEXIDINE GLUCONATE CLOTH 2 % EX PADS: 6.0000 | MEDICATED_PAD | Freq: Once | CUTANEOUS | Status: DC

## 2020-11-19 MED ORDER — PROPOFOL 500 MG/50ML IV EMUL
INTRAVENOUS | Status: DC | PRN
  Administered 2020-11-19: 125 ug/kg/min via INTRAVENOUS

## 2020-11-19 MED ORDER — PROPOFOL 10 MG/ML IV BOLUS
INTRAVENOUS | Status: DC | PRN
  Administered 2020-11-19: 60 mg via INTRAVENOUS
  Administered 2020-11-19: 40 mg via INTRAVENOUS

## 2020-11-19 MED ORDER — LACTATED RINGERS IV SOLN: INTRAVENOUS | Status: DC

## 2020-11-19 NOTE — H&P (Signed)
Primary Care Physician:  Tammi Sou, MD Primary Gastroenterologist:  Dr. Abbey Chatters  Pre-Procedure History & Physical: HPI:  Melissa Harrington is a 50 y.o. female is here for a colonoscopy for colon cancer screening purposes.  Patient denies any family history of colorectal cancer.  No melena or hematochezia.  No abdominal pain or unintentional weight loss.  No change in bowel habits.  Overall feels well from a GI standpoint.  Past Medical History:  Diagnosis Date  . 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 Verlin Fester  . Anxiety    + hx of panic attacks.  Was on lexapro for a short time in remote past.  . Dyspareunia 10/10/2013  . Endometrial polyp 10/17/2013  . Essential hypertension   . Hay fever   . Hemorrhoids 10/10/2013  . History of melanoma   . Menometrorrhagia 2013   using herbal treatments and this has resolved.  . Other and unspecified ovarian cyst 10/17/2013  . PONV (postoperative nausea and vomiting)     Past Surgical History:  Procedure Laterality Date  . CESAREAN SECTION     3  . ECTOPIC PREGNANCY SURGERY     four surgeries (left fallopian tube removed)  . SKIN CANCER EXCISION  2007   on pt's back     Prior to Admission medications   Medication Sig Start Date End Date Taking? Authorizing Provider  ALPRAZolam Duanne Moron) 0.5 MG tablet 1 tab po bid prn anxiety Patient taking differently: Take 0.25 mg by mouth 2 (two) times daily as needed for anxiety or sleep. 10/04/20  Yes Cresenzo-Dishmon, Joaquim Lai, CNM  albuterol (VENTOLIN HFA) 108 (90 Base) MCG/ACT inhaler 2 puffs q4h prn wheezing, cough, or shortness of breath Patient taking differently: Inhale 2 puffs into the lungs every 4 (four) hours as needed for wheezing or shortness of breath. 11/06/20   McGowen, Adrian Blackwater, MD  cephALEXin (KEFLEX) 500 MG capsule Take 1 capsule (500 mg total) by mouth 3 (three) times daily. 11/06/20   McGowen, Adrian Blackwater, MD  EPINEPHrine (AUVI-Q) 0.3 mg/0.3 mL IJ SOAJ injection Inject 0.3 mLs (0.3 mg  total) into the muscle as needed for anaphylaxis. 04/12/19   Bobbitt, Sedalia Muta, MD  hydrochlorothiazide (HYDRODIURIL) 25 MG tablet Take 1 tablet (25 mg total) by mouth daily. 10/04/20   Cresenzo-Dishmon, Joaquim Lai, CNM  irbesartan (AVAPRO) 300 MG tablet Take 1 tablet (300 mg total) by mouth daily. 11/06/20   McGowen, Adrian Blackwater, MD  predniSONE (DELTASONE) 20 MG tablet 2 tabs po qd x 5d Patient not taking: Reported on 11/13/2020 11/06/20   McGowen, Adrian Blackwater, MD  SUPREP BOWEL PREP KIT 17.5-3.13-1.6 GM/177ML SOLN Take by mouth. Patient not taking: No sig reported 10/30/20   [provider]    Allergies as of 10/31/2020 - Review Complete 10/30/2020  Allergen Reaction Noted  . Penicillins Hives 10/10/2013  . Amlodipine Rash 06/28/2019  . Benazepril Rash 06/28/2019  . Erythromycin Rash 11/18/2016    Family History  Problem Relation Age of Onset  . Lung cancer Mother 45       Former smoker  . Mental illness Father   . Alcohol abuse Father   . Hypertension Father   . Heart disease Maternal Grandmother   . Alcohol abuse Maternal Grandmother   . Alcohol abuse Paternal Grandfather   . Alcohol abuse Paternal Grandmother   . Alcohol abuse Maternal Grandfather   . Allergic rhinitis Neg Hx   . Asthma Neg Hx   . Eczema Neg Hx   . Urticaria Neg  Hx     Social History   Socioeconomic History  . Marital status: Married    Spouse name: Not on file  . Number of children: Not on file  . Years of education: Not on file  . Highest education level: Not on file  Occupational History  . Not on file  Tobacco Use  . Smoking status: Never Smoker  . Smokeless tobacco: Never Used  Vaping Use  . Vaping Use: Never used  Substance and Sexual Activity  . Alcohol use: Yes    Alcohol/week: 2.0 standard drinks    Types: 2 Glasses of wine per week    Comment: occ  . Drug use: No  . Sexual activity: Yes    Birth control/protection: Surgical  Other Topics Concern  . Not on file  Social  History Narrative   Married, 3 children.   Educ: BA from [employer]   Occup: homemaker   Social Determinants of Health   Financial Resource Strain: Low Risk   . Difficulty of Paying Living Expenses: Not hard at all  Food Insecurity: No Food Insecurity  . Worried About Charity fundraiser in the Last Year: Never true  . Ran Out of Food in the Last Year: Never true  Transportation Needs: No Transportation Needs  . Lack of Transportation (Medical): No  . Lack of Transportation (Non-Medical): No  Physical Activity: Inactive  . Days of Exercise per Week: 0 days  . Minutes of Exercise per Session: 20 min  Stress: Stress Concern Present  . Feeling of Stress : Rather much  Social Connections: Socially Integrated  . Frequency of Communication with Friends and Family: More than three times a week  . Frequency of Social Gatherings with Friends and Family: Once a week  . Attends Religious Services: More than 4 times per year  . Active Member of Clubs or Organizations: No  . Attends Archivist Meetings: 1 to 4 times per year  . Marital Status: Married  Human resources officer Violence: Not At Risk  . Fear of Current or Ex-Partner: No  . Emotionally Abused: No  . Physically Abused: No  . Sexually Abused: No    Review of Systems: See HPI, otherwise negative ROS  Impression/Plan: Melissa Harrington is here for a colonoscopy to be performed for colon cancer screening purposes.  The risks of the procedure including infection, bleed, or perforation as well as benefits, limitations, alternatives and imponderables have been reviewed with the patient. Questions have been answered. All parties agreeable.

## 2020-11-19 NOTE — Anesthesia Preprocedure Evaluation (Signed)
Anesthesia Evaluation  Patient identified by MRN, date of birth, ID band Patient awake    Reviewed: Allergy & Precautions, H&P , NPO status , Patient's Chart, lab work & pertinent test results, reviewed documented beta blocker date and time   History of Anesthesia Complications (+) PONV and history of anesthetic complications  Airway Mallampati: II  TM Distance: >3 FB Neck ROM: full    Dental no notable dental hx.    Pulmonary neg pulmonary ROS,    Pulmonary exam normal breath sounds clear to auscultation       Cardiovascular Exercise Tolerance: Good hypertension, negative cardio ROS   Rhythm:regular Rate:Normal     Neuro/Psych PSYCHIATRIC DISORDERS Anxiety negative neurological ROS     GI/Hepatic negative GI ROS, Neg liver ROS,   Endo/Other  negative endocrine ROS  Renal/GU negative Renal ROS  negative genitourinary   Musculoskeletal   Abdominal   Peds  Hematology negative hematology ROS (+)   Anesthesia Other Findings   Reproductive/Obstetrics negative OB ROS                             Anesthesia Physical Anesthesia Plan  ASA: II  Anesthesia Plan: General   Post-op Pain Management:    Induction:   PONV Risk Score and Plan: Propofol infusion  Airway Management Planned:   Additional Equipment:   Intra-op Plan:   Post-operative Plan:   Informed Consent: I have reviewed the patients History and Physical, chart, labs and discussed the procedure including the risks, benefits and alternatives for the proposed anesthesia with the patient or authorized representative who has indicated his/her understanding and acceptance.     Dental Advisory Given  Plan Discussed with: CRNA  Anesthesia Plan Comments:         Anesthesia Quick Evaluation

## 2020-11-19 NOTE — Op Note (Signed)
[facility] Patient Name: Melissa Harrington Procedure Date: 11/19/2020 9:08 AM MRN: [id number] Date of Birth: 1971-09-23 Attending MD: Elon Alas. Abbey Chatters DO CSN: [id number] Age: 50 Admit Type: Outpatient Procedure:                Colonoscopy Indications:              Screening for colorectal malignant neoplasm Providers:                Elon Alas. Abbey Chatters, DO, Caprice Kluver, Aram Candela Referring MD:              Medicines:                See the Anesthesia note for documentation of the                            administered medications Complications:            No immediate complications. Estimated Blood Loss:     Estimated blood loss was minimal. Procedure:                Pre-Anesthesia Assessment:                           - The anesthesia plan was to use monitored                            anesthesia care (MAC).                           After obtaining informed consent, the colonoscope                            was passed under direct vision. Throughout the                            procedure, the patient's blood pressure, pulse, and                            oxygen saturations were monitored continuously. The                            [id number]-[id number]) was introduced through the anus                            and advanced to the the terminal ileum, with                            identification of the appendiceal orifice and IC                            valve. The colonoscopy was performed without                            difficulty. The patient tolerated the procedure                            well. The quality  of the bowel preparation was                            evaluated using the BBPS [facility] Bowel Preparation                            Scale) with scores of: Right Colon = 3, Transverse                            Colon = 3 and Left Colon = 3 (entire mucosa seen                            well with no residual staining, small fragments of                             stool or opaque liquid). The total BBPS score                            equals 9. Scope In: 9:25:17 AM Scope Out: 9:34:39 AM Scope Withdrawal Time: 0 hours 6 minutes 40 seconds  Total Procedure Duration: 0 hours 9 minutes 22 seconds  Findings:      Skin tags were found on perianal exam.      Non-bleeding internal hemorrhoids were found during endoscopy.      A 8 mm polyp was found in the sigmoid colon. The polyp was pedunculated.       The polyp was removed with a hot snare. Resection and retrieval were       complete.      The terminal ileum appeared normal. Impression:               - Perianal skin tags found on perianal exam.                           - Non-bleeding internal hemorrhoids.                           - One 8 mm polyp in the sigmoid colon, removed with                            a hot snare. Resected and retrieved.                           - The examined portion of the ileum was normal. Moderate Sedation:      Per Anesthesia Care Recommendation:           - Patient has a contact number available for                            emergencies. The signs and symptoms of potential                            delayed complications were discussed with the  patient. Return to normal activities tomorrow.                            Written discharge instructions were provided to the                            patient.                           - Resume previous diet.                           - Continue present medications.                           - Await pathology results.                           - Repeat colonoscopy in 5 years for surveillance.                           - Return to GI clinic PRN. Procedure Code(s):        --- Professional ---                           [phone], Colonoscopy, flexible; with removal of                            tumor(s), polyp(s), or other lesion(s) by snare                            technique Diagnosis Code(s):         --- Professional ---                           Z12.11, Encounter for screening for malignant                            neoplasm of colon                           K63.5, Polyp of colon                           K64.8, Other hemorrhoids                           K64.4, Residual hemorrhoidal skin tags CPT copyright 2019 American Medical Association. All rights reserved. The codes documented in this report are preliminary and upon coder review may  be revised to meet current compliance requirements. Elon Alas. Abbey Chatters, DO Roy Abbey Chatters, DO 11/19/2020 9:43:42 AM This report has been signed electronically. Number of Addenda: 0

## 2020-11-19 NOTE — Discharge Instructions (Addendum)
Colonoscopy Discharge Instructions  Read the instructions outlined below and refer to this sheet in the next few weeks. These discharge instructions provide you with general information on caring for yourself after you leave the hospital. Your doctor may also give you specific instructions. While your treatment has been planned according to the most current medical practices available, unavoidable complications occasionally occur.   ACTIVITY  You may resume your regular activity, but move at a slower pace for the next 24 hours.   Take frequent rest periods for the next 24 hours.   Walking will help get rid of the air and reduce the bloated feeling in your belly (abdomen).   No driving for 24 hours (because of the medicine (anesthesia) used during the test).    Do not sign any important legal documents or operate any machinery for 24 hours (because of the anesthesia used during the test).  NUTRITION  Drink plenty of fluids.   You may resume your normal diet as instructed by your doctor.   Begin with a light meal and progress to your normal diet. Heavy or fried foods are harder to digest and may make you feel sick to your stomach (nauseated).   Avoid alcoholic beverages for 24 hours or as instructed.  MEDICATIONS  You may resume your normal medications unless your doctor tells you otherwise.  WHAT YOU CAN EXPECT TODAY  Some feelings of bloating in the abdomen.   Passage of more gas than usual.   Spotting of blood in your stool or on the toilet paper.  IF YOU HAD POLYPS REMOVED DURING THE COLONOSCOPY:  No aspirin products for 7 days or as instructed.   No alcohol for 7 days or as instructed.   Eat a soft diet for the next 24 hours.  FINDING OUT THE RESULTS OF YOUR TEST Not all test results are available during your visit. If your test results are not back during the visit, make an appointment with your caregiver to find out the results. Do not assume everything is normal if  you have not heard from your caregiver or the medical facility. It is important for you to follow up on all of your test results.  SEEK IMMEDIATE MEDICAL ATTENTION IF:  You have more than a spotting of blood in your stool.   Your belly is swollen (abdominal distention).   You are nauseated or vomiting.   You have a temperature over 101.   You have abdominal pain or discomfort that is severe or gets worse throughout the day.   Your colonoscopy revealed 1 polyp(s) which I removed successfully. Await pathology results, my office will contact you. I recommend repeating colonoscopy in 5 years for surveillance purposes. Follow up with GI as needed.   I hope you have a great rest of your week!  Elon Alas. Abbey Chatters, D.O. Gastroenterology and Hepatology [facility]    Colon Polyps  Polyps are tissue growths inside the body. Polyps can grow in many places, including the large intestine (colon). A polyp may be a round bump or a mushroom-shaped growth. You could have one polyp or several. Most colon polyps are noncancerous (benign). However, some colon polyps can become cancerous over time. Finding and removing the polyps early can help prevent this. What are the causes? The exact cause of colon polyps is not known. What increases the risk? You are more likely to develop this condition if you:  Have a family history of colon cancer or colon polyps.  Are  older than 68 or older than 1 if you are African American.  Have inflammatory bowel disease, such as ulcerative colitis or Crohn's disease.  Have certain hereditary conditions, such as: ? Familial adenomatous polyposis. ? Lynch syndrome. ? Turcot syndrome. ? Peutz-Jeghers syndrome.  Are overweight.  Smoke cigarettes.  Do not get enough exercise.  Drink too much alcohol.  Eat a diet that is high in fat and red meat and low in fiber.  Had childhood cancer that was treated with abdominal  radiation. What are the signs or symptoms? Most polyps do not cause symptoms. If you have symptoms, they may include:  Blood coming from your rectum when having a bowel movement.  Blood in your stool. The stool may look dark red or black.  Abdominal pain.  A change in bowel habits, such as constipation or diarrhea. How is this diagnosed? This condition is diagnosed with a colonoscopy. This is a procedure in which a lighted, flexible scope is inserted into the anus and then passed into the colon to examine the area. Polyps are sometimes found when a colonoscopy is done as part of routine cancer screening tests. How is this treated? Treatment for this condition involves removing any polyps that are found. Most polyps can be removed during a colonoscopy. Those polyps will then be tested for cancer. Additional treatment may be needed depending on the results of testing. Follow these instructions at home: Lifestyle  Maintain a healthy weight, or lose weight if recommended by your health care provider.  Exercise every day or as told by your health care provider.  Do not use any products that contain nicotine or tobacco, such as cigarettes and e-cigarettes. If you need help quitting, ask your health care provider.  If you drink alcohol, limit how much you have: ? 0-1 drink a day for women. ? 0-2 drinks a day for men.  Be aware of how much alcohol is in your drink. In the [place] one drink equals one 12 oz bottle of beer (355 mL), one 5 oz glass of wine (148 mL), or one 1 oz shot of hard liquor (44 mL). Eating and drinking   Eat foods that are high in fiber, such as fruits, vegetables, and whole grains.  Eat foods that are high in calcium and vitamin D, such as milk, cheese, yogurt, eggs, liver, fish, and broccoli.  Limit foods that are high in fat, such as fried foods and desserts.  Limit the amount of red meat and processed meat you eat, such as hot dogs, sausage, bacon, and lunch  meats. General instructions  Keep all follow-up visits as told by your health care provider. This is important. ? This includes having regularly scheduled colonoscopies. ? Talk to your health care provider about when you need a colonoscopy. Contact a health care provider if:  You have new or worsening bleeding during a bowel movement.  You have new or increased blood in your stool.  You have a change in bowel habits.  You lose weight for no known reason. Summary  Polyps are tissue growths inside the body. Polyps can grow in many places, including the colon.  Most colon polyps are noncancerous (benign), but some can become cancerous over time.  This condition is diagnosed with a colonoscopy.  Treatment for this condition involves removing any polyps that are found. Most polyps can be removed during a colonoscopy. This information is not intended to replace advice given to you by your health care provider. Make sure you  discuss any questions you have with your health care provider. Document Revised: 02/18/2018 Document Reviewed: 02/18/2018 Elsevier Patient Education  2020 [employer]     Monitored Anesthesia Care, Care After These instructions provide you with information about caring for yourself after your procedure. Your health care provider may also give you more specific instructions. Your treatment has been planned according to current medical practices, but problems sometimes occur. Call your health care provider if you have any problems or questions after your procedure. What can I expect after the procedure? After your procedure, you may:  Feel sleepy for several hours.  Feel clumsy and have poor balance for several hours.  Feel forgetful about what happened after the procedure.  Have poor judgment for several hours.  Feel nauseous or vomit.  Have a sore throat if you had a breathing tube during the procedure. Follow these instructions at home: For at least  24 hours after the procedure:      Have a responsible adult stay with you. It is important to have someone help care for you until you are awake and alert.  Rest as needed.  Do not: ? Participate in activities in which you could fall or become injured. ? Drive. ? Use heavy machinery. ? Drink alcohol. ? Take sleeping pills or medicines that cause drowsiness. ? Make important decisions or sign legal documents. ? Take care of children on your own. Eating and drinking  Follow the diet that is recommended by your health care provider.  If you vomit, drink water, juice, or soup when you can drink without vomiting.  Make sure you have little or no nausea before eating solid foods. General instructions  Take over-the-counter and prescription medicines only as told by your health care provider.  If you have sleep apnea, surgery and certain medicines can increase your risk for breathing problems. Follow instructions from your health care provider about wearing your sleep device: ? Anytime you are sleeping, including during daytime naps. ? While taking prescription pain medicines, sleeping medicines, or medicines that make you drowsy.  If you smoke, do not smoke without supervision.  Keep all follow-up visits as told by your health care provider. This is important. Contact a health care provider if:  You keep feeling nauseous or you keep vomiting.  You feel light-headed.  You develop a rash.  You have a fever. Get help right away if:  You have trouble breathing. Summary  For several hours after your procedure, you may feel sleepy and have poor judgment.  Have a responsible adult stay with you for at least 24 hours or until you are awake and alert. This information is not intended to replace advice given to you by your health care provider. Make sure you discuss any questions you have with your health care provider. Document Revised: 02/01/2018 Document Reviewed:  02/24/2016 Elsevier Patient Education  2020 [employer].

## 2020-11-19 NOTE — Transfer of Care (Signed)
Immediate Anesthesia Transfer of Care Note  Patient: Melissa Harrington  Procedure(s) Performed: COLONOSCOPY WITH PROPOFOL (N/A ) POLYPECTOMY  Patient Location: PACU  Anesthesia Type:General  Level of Consciousness: awake, alert , oriented and patient cooperative  Airway & Oxygen Therapy: Patient Spontanous Breathing  Post-op Assessment: Report given to RN, Post -op Vital signs reviewed and stable and Patient moving all extremities X 4  Post vital signs: Reviewed and stable  Last Vitals:  Vitals Value Taken Time  BP    Temp    Pulse    Resp    SpO2      Last Pain:  Vitals:   11/19/20 0920  TempSrc:   PainSc: 0-No pain      Patients Stated Pain Goal: 5 (11/19/20 0754)  Complications: No complications documented.

## 2020-11-19 NOTE — Anesthesia Postprocedure Evaluation (Signed)
Anesthesia Post Note  Patient: Melissa Harrington  Procedure(s) Performed: COLONOSCOPY WITH PROPOFOL (N/A ) POLYPECTOMY  Patient location during evaluation: Phase II Anesthesia Type: General Level of consciousness: awake, oriented, awake and alert and patient cooperative Pain management: satisfactory to patient Vital Signs Assessment: post-procedure vital signs reviewed and stable Respiratory status: spontaneous breathing, respiratory function stable and nonlabored ventilation Cardiovascular status: stable Postop Assessment: no apparent nausea or vomiting Anesthetic complications: no   No complications documented.   Last Vitals:  Vitals:   11/19/20 0807  BP: (!) 146/99  Pulse: 87  Resp: 12  Temp: 36.8 C  SpO2: 98%    Last Pain:  Vitals:   11/19/20 0920  TempSrc:   PainSc: 0-No pain                 Yoan Sallade

## 2020-11-20 ENCOUNTER — Encounter: Payer: Self-pay | Admitting: Family Medicine

## 2020-11-20 LAB — SURGICAL PATHOLOGY

## 2020-11-22 ENCOUNTER — Encounter (HOSPITAL_COMMUNITY): Payer: Self-pay | Admitting: Internal Medicine

## 2021-05-25 ENCOUNTER — Emergency Department (HOSPITAL_COMMUNITY): Payer: 59

## 2021-05-25 ENCOUNTER — Inpatient Hospital Stay (HOSPITAL_COMMUNITY): Payer: 59

## 2021-05-25 ENCOUNTER — Inpatient Hospital Stay (HOSPITAL_COMMUNITY): Payer: 59 | Admitting: Certified Registered"

## 2021-05-25 ENCOUNTER — Inpatient Hospital Stay (HOSPITAL_COMMUNITY)
Admission: EM | Admit: 2021-05-25 | Discharge: 2021-06-21 | DRG: 956 | Disposition: A | Discharge status: Rehabilitation Facility | Payer: 59 | Attending: Surgery | Admitting: Surgery

## 2021-05-25 ENCOUNTER — Encounter (HOSPITAL_COMMUNITY): Admission: EM | Disposition: A | Discharge status: Rehabilitation Facility | Payer: Self-pay | Source: Home / Self Care

## 2021-05-25 ENCOUNTER — Encounter (HOSPITAL_COMMUNITY): Payer: Self-pay | Admitting: General Surgery

## 2021-05-25 DIAGNOSIS — Z9911 Dependence on respirator [ventilator] status: Secondary | ICD-10-CM

## 2021-05-25 DIAGNOSIS — S066X9A Traumatic subarachnoid hemorrhage with loss of consciousness of unspecified duration, initial encounter: Secondary | ICD-10-CM | POA: Diagnosis present

## 2021-05-25 DIAGNOSIS — J189 Pneumonia, unspecified organism: Secondary | ICD-10-CM | POA: Diagnosis not present

## 2021-05-25 DIAGNOSIS — F10130 Alcohol abuse with withdrawal, uncomplicated: Secondary | ICD-10-CM | POA: Diagnosis present

## 2021-05-25 DIAGNOSIS — S42352A Displaced comminuted fracture of shaft of humerus, left arm, initial encounter for closed fracture: Secondary | ICD-10-CM | POA: Diagnosis present

## 2021-05-25 DIAGNOSIS — S32461A Displaced associated transverse-posterior fracture of right acetabulum, initial encounter for closed fracture: Secondary | ICD-10-CM | POA: Diagnosis present

## 2021-05-25 DIAGNOSIS — I2699 Other pulmonary embolism without acute cor pulmonale: Secondary | ICD-10-CM | POA: Diagnosis not present

## 2021-05-25 DIAGNOSIS — S020XXB Fracture of vault of skull, initial encounter for open fracture: Secondary | ICD-10-CM | POA: Diagnosis present

## 2021-05-25 DIAGNOSIS — S065X9A Traumatic subdural hemorrhage with loss of consciousness of unspecified duration, initial encounter: Secondary | ICD-10-CM | POA: Diagnosis present

## 2021-05-25 DIAGNOSIS — R030 Elevated blood-pressure reading, without diagnosis of hypertension: Secondary | ICD-10-CM | POA: Diagnosis not present

## 2021-05-25 DIAGNOSIS — S32592A Other specified fracture of left pubis, initial encounter for closed fracture: Secondary | ICD-10-CM | POA: Diagnosis present

## 2021-05-25 DIAGNOSIS — Y92410 Unspecified street and highway as the place of occurrence of the external cause: Secondary | ICD-10-CM | POA: Diagnosis not present

## 2021-05-25 DIAGNOSIS — S0232XA Fracture of orbital floor, left side, initial encounter for closed fracture: Secondary | ICD-10-CM | POA: Diagnosis present

## 2021-05-25 DIAGNOSIS — S0240DA Maxillary fracture, left side, initial encounter for closed fracture: Secondary | ICD-10-CM | POA: Diagnosis present

## 2021-05-25 DIAGNOSIS — Y903 Blood alcohol level of 60-79 mg/100 ml: Secondary | ICD-10-CM | POA: Diagnosis present

## 2021-05-25 DIAGNOSIS — R578 Other shock: Secondary | ICD-10-CM | POA: Diagnosis present

## 2021-05-25 DIAGNOSIS — S62102A Fracture of unspecified carpal bone, left wrist, initial encounter for closed fracture: Secondary | ICD-10-CM | POA: Diagnosis present

## 2021-05-25 DIAGNOSIS — T07XXXA Unspecified multiple injuries, initial encounter: Secondary | ICD-10-CM | POA: Diagnosis not present

## 2021-05-25 DIAGNOSIS — E8770 Fluid overload, unspecified: Secondary | ICD-10-CM | POA: Diagnosis not present

## 2021-05-25 DIAGNOSIS — S0219XA Other fracture of base of skull, initial encounter for closed fracture: Secondary | ICD-10-CM

## 2021-05-25 DIAGNOSIS — S92351A Displaced fracture of fifth metatarsal bone, right foot, initial encounter for closed fracture: Secondary | ICD-10-CM | POA: Diagnosis present

## 2021-05-25 DIAGNOSIS — T17910A Gastric contents in respiratory tract, part unspecified causing asphyxiation, initial encounter: Secondary | ICD-10-CM

## 2021-05-25 DIAGNOSIS — S2232XA Fracture of one rib, left side, initial encounter for closed fracture: Secondary | ICD-10-CM | POA: Diagnosis present

## 2021-05-25 DIAGNOSIS — S72009A Fracture of unspecified part of neck of unspecified femur, initial encounter for closed fracture: Secondary | ICD-10-CM

## 2021-05-25 DIAGNOSIS — L8990 Pressure ulcer of unspecified site, unspecified stage: Secondary | ICD-10-CM | POA: Insufficient documentation

## 2021-05-25 DIAGNOSIS — R339 Retention of urine, unspecified: Secondary | ICD-10-CM | POA: Diagnosis not present

## 2021-05-25 DIAGNOSIS — Z4659 Encounter for fitting and adjustment of other gastrointestinal appliance and device: Secondary | ICD-10-CM

## 2021-05-25 DIAGNOSIS — Z419 Encounter for procedure for purposes other than remedying health state, unspecified: Secondary | ICD-10-CM

## 2021-05-25 DIAGNOSIS — F4310 Post-traumatic stress disorder, unspecified: Secondary | ICD-10-CM | POA: Diagnosis not present

## 2021-05-25 DIAGNOSIS — T1490XA Injury, unspecified, initial encounter: Secondary | ICD-10-CM

## 2021-05-25 DIAGNOSIS — S72402S Unspecified fracture of lower end of left femur, sequela: Secondary | ICD-10-CM | POA: Diagnosis not present

## 2021-05-25 DIAGNOSIS — R0602 Shortness of breath: Secondary | ICD-10-CM

## 2021-05-25 DIAGNOSIS — S32401A Unspecified fracture of right acetabulum, initial encounter for closed fracture: Secondary | ICD-10-CM

## 2021-05-25 DIAGNOSIS — S069X0S Unspecified intracranial injury without loss of consciousness, sequela: Secondary | ICD-10-CM | POA: Diagnosis not present

## 2021-05-25 DIAGNOSIS — S069X9S Unspecified intracranial injury with loss of consciousness of unspecified duration, sequela: Secondary | ICD-10-CM | POA: Diagnosis not present

## 2021-05-25 DIAGNOSIS — R58 Hemorrhage, not elsewhere classified: Secondary | ICD-10-CM

## 2021-05-25 DIAGNOSIS — Z20822 Contact with and (suspected) exposure to covid-19: Secondary | ICD-10-CM | POA: Diagnosis present

## 2021-05-25 DIAGNOSIS — Z01818 Encounter for other preprocedural examination: Secondary | ICD-10-CM

## 2021-05-25 DIAGNOSIS — S27321A Contusion of lung, unilateral, initial encounter: Secondary | ICD-10-CM | POA: Diagnosis present

## 2021-05-25 DIAGNOSIS — S069XAA Unspecified intracranial injury with loss of consciousness status unknown, initial encounter: Secondary | ICD-10-CM | POA: Diagnosis present

## 2021-05-25 DIAGNOSIS — E870 Hyperosmolality and hypernatremia: Secondary | ICD-10-CM | POA: Diagnosis not present

## 2021-05-25 DIAGNOSIS — J80 Acute respiratory distress syndrome: Secondary | ICD-10-CM | POA: Diagnosis present

## 2021-05-25 DIAGNOSIS — T148XXA Other injury of unspecified body region, initial encounter: Secondary | ICD-10-CM

## 2021-05-25 DIAGNOSIS — Z978 Presence of other specified devices: Secondary | ICD-10-CM

## 2021-05-25 DIAGNOSIS — S069X9A Unspecified intracranial injury with loss of consciousness of unspecified duration, initial encounter: Secondary | ICD-10-CM | POA: Diagnosis present

## 2021-05-25 DIAGNOSIS — Z23 Encounter for immunization: Secondary | ICD-10-CM

## 2021-05-25 DIAGNOSIS — E876 Hypokalemia: Secondary | ICD-10-CM | POA: Diagnosis not present

## 2021-05-25 DIAGNOSIS — S72451A Displaced supracondylar fracture without intracondylar extension of lower end of right femur, initial encounter for closed fracture: Secondary | ICD-10-CM | POA: Diagnosis present

## 2021-05-25 DIAGNOSIS — R739 Hyperglycemia, unspecified: Secondary | ICD-10-CM | POA: Diagnosis not present

## 2021-05-25 DIAGNOSIS — S27329A Contusion of lung, unspecified, initial encounter: Secondary | ICD-10-CM

## 2021-05-25 DIAGNOSIS — D62 Acute posthemorrhagic anemia: Secondary | ICD-10-CM | POA: Diagnosis present

## 2021-05-25 DIAGNOSIS — S069X0D Unspecified intracranial injury without loss of consciousness, subsequent encounter: Secondary | ICD-10-CM | POA: Diagnosis not present

## 2021-05-25 DIAGNOSIS — S069X3S Unspecified intracranial injury with loss of consciousness of 1 hour to 5 hours 59 minutes, sequela: Secondary | ICD-10-CM | POA: Diagnosis not present

## 2021-05-25 DIAGNOSIS — S0181XA Laceration without foreign body of other part of head, initial encounter: Secondary | ICD-10-CM

## 2021-05-25 DIAGNOSIS — S069X1S Unspecified intracranial injury with loss of consciousness of 30 minutes or less, sequela: Secondary | ICD-10-CM | POA: Diagnosis not present

## 2021-05-25 DIAGNOSIS — S32810S Multiple fractures of pelvis with stable disruption of pelvic ring, sequela: Secondary | ICD-10-CM | POA: Diagnosis not present

## 2021-05-25 DIAGNOSIS — I2693 Single subsegmental pulmonary embolism without acute cor pulmonale: Secondary | ICD-10-CM | POA: Diagnosis not present

## 2021-05-25 DIAGNOSIS — S069X1D Unspecified intracranial injury with loss of consciousness of 30 minutes or less, subsequent encounter: Secondary | ICD-10-CM | POA: Diagnosis not present

## 2021-05-25 DIAGNOSIS — G8918 Other acute postprocedural pain: Secondary | ICD-10-CM | POA: Diagnosis not present

## 2021-05-25 DIAGNOSIS — J9690 Respiratory failure, unspecified, unspecified whether with hypoxia or hypercapnia: Secondary | ICD-10-CM

## 2021-05-25 DIAGNOSIS — T17918A Gastric contents in respiratory tract, part unspecified causing other injury, initial encounter: Secondary | ICD-10-CM

## 2021-05-25 DIAGNOSIS — Z452 Encounter for adjustment and management of vascular access device: Secondary | ICD-10-CM

## 2021-05-25 HISTORY — PX: LACERATION REPAIR: SHX5284

## 2021-05-25 HISTORY — PX: CAST APPLICATION: SHX380

## 2021-05-25 HISTORY — PX: CLOSED REDUCTION RADIAL SHAFT: SHX5008

## 2021-05-25 HISTORY — PX: INSERTION OF TRACTION PIN: SHX6560

## 2021-05-25 HISTORY — PX: EXTERNAL FIXATION LEG: SHX1549

## 2021-05-25 HISTORY — PX: CLOSED REDUCTION HUMERUS FRACTURE: SHX985

## 2021-05-25 HISTORY — PX: I & D EXTREMITY: SHX5045

## 2021-05-25 HISTORY — DX: Essential (primary) hypertension: I10

## 2021-05-25 HISTORY — DX: Malignant melanoma of skin, unspecified: C43.9

## 2021-05-25 LAB — CBC
HCT: 42.5 % (ref 36.0–46.0)
Hemoglobin: 14.4 g/dL (ref 12.0–15.0)
MCH: 33.3 pg (ref 26.0–34.0)
MCHC: 33.9 g/dL (ref 30.0–36.0)
MCV: 98.2 fL (ref 80.0–100.0)
Platelets: 273 10*3/uL (ref 150–400)
RBC: 4.33 MIL/uL (ref 3.87–5.11)
RDW: 13.2 % (ref 11.5–15.5)
WBC: 15 10*3/uL — ABNORMAL HIGH (ref 4.0–10.5)
nRBC: 0 % (ref 0.0–0.2)

## 2021-05-25 LAB — I-STAT CHEM 8, ED
BUN: 17 mg/dL (ref 6–20)
Calcium, Ion: 1.1 mmol/L — ABNORMAL LOW (ref 1.15–1.40)
Chloride: 100 mmol/L (ref 98–111)
Creatinine, Ser: 1.1 mg/dL — ABNORMAL HIGH (ref 0.44–1.00)
Glucose, Bld: 222 mg/dL — ABNORMAL HIGH (ref 70–99)
HCT: 42 % (ref 36.0–46.0)
Hemoglobin: 14.3 g/dL (ref 12.0–15.0)
Potassium: 3.1 mmol/L — ABNORMAL LOW (ref 3.5–5.1)
Sodium: 135 mmol/L (ref 135–145)
TCO2: 19 mmol/L — ABNORMAL LOW (ref 22–32)

## 2021-05-25 LAB — POCT I-STAT 7, (LYTES, BLD GAS, ICA,H+H)
Acid-base deficit: 3 mmol/L — ABNORMAL HIGH (ref 0.0–2.0)
Acid-base deficit: 6 mmol/L — ABNORMAL HIGH (ref 0.0–2.0)
Acid-base deficit: 6 mmol/L — ABNORMAL HIGH (ref 0.0–2.0)
Acid-base deficit: 7 mmol/L — ABNORMAL HIGH (ref 0.0–2.0)
Bicarbonate: 20 mmol/L (ref 20.0–28.0)
Bicarbonate: 20.3 mmol/L (ref 20.0–28.0)
Bicarbonate: 21 mmol/L (ref 20.0–28.0)
Bicarbonate: 22.8 mmol/L (ref 20.0–28.0)
Calcium, Ion: 1.06 mmol/L — ABNORMAL LOW (ref 1.15–1.40)
Calcium, Ion: 1.09 mmol/L — ABNORMAL LOW (ref 1.15–1.40)
Calcium, Ion: 1.11 mmol/L — ABNORMAL LOW (ref 1.15–1.40)
Calcium, Ion: 1.22 mmol/L (ref 1.15–1.40)
HCT: 28 % — ABNORMAL LOW (ref 36.0–46.0)
HCT: 28 % — ABNORMAL LOW (ref 36.0–46.0)
HCT: 32 % — ABNORMAL LOW (ref 36.0–46.0)
HCT: 32 % — ABNORMAL LOW (ref 36.0–46.0)
Hemoglobin: 10.9 g/dL — ABNORMAL LOW (ref 12.0–15.0)
Hemoglobin: 10.9 g/dL — ABNORMAL LOW (ref 12.0–15.0)
Hemoglobin: 9.5 g/dL — ABNORMAL LOW (ref 12.0–15.0)
Hemoglobin: 9.5 g/dL — ABNORMAL LOW (ref 12.0–15.0)
O2 Saturation: 100 %
O2 Saturation: 94 %
O2 Saturation: 98 %
O2 Saturation: 99 %
Patient temperature: 36.5
Patient temperature: 36.8
Patient temperature: 37.4
Patient temperature: 97.8
Potassium: 2.9 mmol/L — ABNORMAL LOW (ref 3.5–5.1)
Potassium: 2.9 mmol/L — ABNORMAL LOW (ref 3.5–5.1)
Potassium: 3.3 mmol/L — ABNORMAL LOW (ref 3.5–5.1)
Potassium: 3.5 mmol/L (ref 3.5–5.1)
Sodium: 138 mmol/L (ref 135–145)
Sodium: 139 mmol/L (ref 135–145)
Sodium: 140 mmol/L (ref 135–145)
Sodium: 141 mmol/L (ref 135–145)
TCO2: 21 mmol/L — ABNORMAL LOW (ref 22–32)
TCO2: 22 mmol/L (ref 22–32)
TCO2: 22 mmol/L (ref 22–32)
TCO2: 24 mmol/L (ref 22–32)
pCO2 arterial: 41.2 mmHg (ref 32.0–48.0)
pCO2 arterial: 42.1 mmHg (ref 32.0–48.0)
pCO2 arterial: 43.7 mmHg (ref 32.0–48.0)
pCO2 arterial: 45.3 mmHg (ref 32.0–48.0)
pH, Arterial: 7.268 — ABNORMAL LOW (ref 7.350–7.450)
pH, Arterial: 7.275 — ABNORMAL LOW (ref 7.350–7.450)
pH, Arterial: 7.289 — ABNORMAL LOW (ref 7.350–7.450)
pH, Arterial: 7.349 — ABNORMAL LOW (ref 7.350–7.450)
pO2, Arterial: 112 mmHg — ABNORMAL HIGH (ref 83.0–108.0)
pO2, Arterial: 172 mmHg — ABNORMAL HIGH (ref 83.0–108.0)
pO2, Arterial: 208 mmHg — ABNORMAL HIGH (ref 83.0–108.0)
pO2, Arterial: 78 mmHg — ABNORMAL LOW (ref 83.0–108.0)

## 2021-05-25 LAB — COMPREHENSIVE METABOLIC PANEL
ALT: 185 U/L — ABNORMAL HIGH (ref 0–44)
AST: 385 U/L — ABNORMAL HIGH (ref 15–41)
Albumin: 3.4 g/dL — ABNORMAL LOW (ref 3.5–5.0)
Alkaline Phosphatase: 84 U/L (ref 38–126)
Anion gap: 17 — ABNORMAL HIGH (ref 5–15)
BUN: 15 mg/dL (ref 6–20)
CO2: 18 mmol/L — ABNORMAL LOW (ref 22–32)
Calcium: 9.4 mg/dL (ref 8.9–10.3)
Chloride: 102 mmol/L (ref 98–111)
Creatinine, Ser: 1.01 mg/dL — ABNORMAL HIGH (ref 0.44–1.00)
GFR, Estimated: >60 mL/min (ref >60)
Glucose, Bld: 243 mg/dL — ABNORMAL HIGH (ref 70–99)
Potassium: 2.8 mmol/L — ABNORMAL LOW (ref 3.5–5.1)
Sodium: 137 mmol/L (ref 135–145)
Total Bilirubin: 1 mg/dL (ref 0.3–1.2)
Total Protein: 6.2 g/dL — ABNORMAL LOW (ref 6.5–8.1)

## 2021-05-25 LAB — PROTIME-INR
INR: 1 (ref 0.8–1.2)
Prothrombin Time: 12.9 seconds (ref 11.4–15.2)

## 2021-05-25 LAB — RESP PANEL BY RT-PCR (FLU A&B, COVID) ARPGX2
Influenza A by PCR: NEGATIVE
Influenza B by PCR: NEGATIVE
SARS Coronavirus 2 by RT PCR: NEGATIVE

## 2021-05-25 LAB — ABO/RH: ABO/RH(D): O POS

## 2021-05-25 LAB — ETHANOL: Alcohol, Ethyl (B): 67 mg/dL — ABNORMAL HIGH (ref <10)

## 2021-05-25 LAB — LACTIC ACID, PLASMA: Lactic Acid, Venous: 6.4 mmol/L (ref 0.5–1.9)

## 2021-05-25 LAB — GLUCOSE, CAPILLARY: Glucose-Capillary: 170 mg/dL — ABNORMAL HIGH (ref 70–99)

## 2021-05-25 SURGERY — REPAIR, LACERATION, 2 OR MORE
Anesthesia: General | Site: Leg Upper | Laterality: Right

## 2021-05-25 MED ORDER — MIDAZOLAM HCL 5 MG/5ML IJ SOLN
INTRAMUSCULAR | Status: DC | PRN
  Administered 2021-05-25: 2 mg via INTRAVENOUS

## 2021-05-25 MED ORDER — VASOPRESSIN 20 UNIT/ML IV SOLN
INTRAVENOUS | Status: DC | PRN
  Administered 2021-05-25 (×2): 1 [IU] via INTRAVENOUS

## 2021-05-25 MED ORDER — IOHEXOL 350 MG/ML SOLN
100.0000 mL | Freq: Once | INTRAVENOUS | Status: AC | PRN
  Administered 2021-05-25: 100 mL via INTRAVENOUS

## 2021-05-25 MED ORDER — SODIUM CHLORIDE 0.9 % IV SOLN: INTRAVENOUS | Status: DC | PRN

## 2021-05-25 MED ORDER — FENTANYL CITRATE (PF) 100 MCG/2ML IJ SOLN
INTRAMUSCULAR | Status: DC | PRN
  Administered 2021-05-25: 100 ug via INTRAVENOUS
  Administered 2021-05-25: 50 ug via INTRAVENOUS

## 2021-05-25 MED ORDER — CLINDAMYCIN PHOSPHATE 900 MG/50ML IV SOLN
INTRAVENOUS | Status: AC
  Filled 2021-05-25: qty 50

## 2021-05-25 MED ORDER — CALCIUM CHLORIDE 10 % IV SOLN
INTRAVENOUS | Status: DC | PRN
  Administered 2021-05-25: 1000 mg via INTRAVENOUS

## 2021-05-25 MED ORDER — PHENYLEPHRINE HCL (PRESSORS) 10 MG/ML IV SOLN
INTRAVENOUS | Status: DC | PRN
  Administered 2021-05-25 (×6): 80 mg via INTRAVENOUS

## 2021-05-25 MED ORDER — LACTATED RINGERS IV SOLN: INTRAVENOUS | Status: DC | PRN

## 2021-05-25 MED ORDER — PHENYLEPHRINE HCL-NACL 10-0.9 MG/250ML-% IV SOLN
INTRAVENOUS | Status: DC | PRN
  Administered 2021-05-25: 25 ug/min via INTRAVENOUS

## 2021-05-25 MED ORDER — VASOPRESSIN 20 UNIT/ML IV SOLN
INTRAVENOUS | Status: AC
  Filled 2021-05-25: qty 1

## 2021-05-25 MED ORDER — FENTANYL CITRATE (PF) 100 MCG/2ML IJ SOLN
INTRAMUSCULAR | Status: AC
  Administered 2021-05-26: 50 ug via INTRAVENOUS
  Filled 2021-05-25: qty 2

## 2021-05-25 MED ORDER — TETANUS-DIPHTH-ACELL PERTUSSIS 5-2.5-18.5 LF-MCG/0.5 IM SUSY
0.5000 mL | PREFILLED_SYRINGE | Freq: Once | INTRAMUSCULAR | Status: AC
  Administered 2021-05-25: 0.5 mL via INTRAMUSCULAR

## 2021-05-25 MED ORDER — SUCCINYLCHOLINE CHLORIDE 20 MG/ML IJ SOLN
INTRAMUSCULAR | Status: DC | PRN
  Administered 2021-05-25: 100 mg via INTRAVENOUS

## 2021-05-25 MED ORDER — POTASSIUM CHLORIDE IN NACL 20-0.9 MEQ/L-% IV SOLN
INTRAVENOUS | Status: DC
  Filled 2021-05-25: qty 1000

## 2021-05-25 MED ORDER — LIDOCAINE HCL (CARDIAC) PF 100 MG/5ML IV SOSY
PREFILLED_SYRINGE | INTRAVENOUS | Status: DC | PRN
  Administered 2021-05-25: 60 mg via INTRAVENOUS

## 2021-05-25 MED ORDER — TRANEXAMIC ACID-NACL 1000-0.7 MG/100ML-% IV SOLN
INTRAVENOUS | Status: AC
  Filled 2021-05-25: qty 100

## 2021-05-25 MED ORDER — TRANEXAMIC ACID-NACL 1000-0.7 MG/100ML-% IV SOLN
INTRAVENOUS | Status: DC | PRN
  Administered 2021-05-25: 1000 mg via INTRAVENOUS

## 2021-05-25 MED ORDER — FENTANYL CITRATE (PF) 100 MCG/2ML IJ SOLN
INTRAMUSCULAR | Status: AC
  Administered 2021-05-25: 50 ug
  Filled 2021-05-25: qty 2

## 2021-05-25 MED ORDER — ONDANSETRON HCL 4 MG/2ML IJ SOLN
4.0000 mg | Freq: Four times a day (QID) | INTRAMUSCULAR | Status: DC | PRN
  Administered 2021-05-26: 4 mg via INTRAVENOUS
  Filled 2021-05-25: qty 2

## 2021-05-25 MED ORDER — ONDANSETRON 4 MG PO TBDP: 4.0000 mg | ORAL_TABLET | Freq: Four times a day (QID) | ORAL | Status: DC | PRN

## 2021-05-25 MED ORDER — SODIUM CHLORIDE 0.9 % IR SOLN
Status: DC | PRN
  Administered 2021-05-25: 3000 mL

## 2021-05-25 MED ORDER — VANCOMYCIN HCL 1000 MG IV SOLR
INTRAVENOUS | Status: AC
  Filled 2021-05-25: qty 1000

## 2021-05-25 MED ORDER — PROPOFOL 500 MG/50ML IV EMUL
INTRAVENOUS | Status: DC | PRN
  Administered 2021-05-25: 75 ug/kg/min via INTRAVENOUS

## 2021-05-25 MED ORDER — ONDANSETRON HCL 4 MG/2ML IJ SOLN
4.0000 mg | Freq: Once | INTRAMUSCULAR | Status: AC
  Administered 2021-05-25: 4 mg via INTRAVENOUS

## 2021-05-25 MED ORDER — SODIUM BICARBONATE 8.4 % IV SOLN
INTRAVENOUS | Status: DC | PRN
  Administered 2021-05-25: 50 meq via INTRAVENOUS

## 2021-05-25 MED ORDER — ONDANSETRON HCL 4 MG/2ML IJ SOLN
INTRAMUSCULAR | Status: AC
  Filled 2021-05-25: qty 2

## 2021-05-25 MED ORDER — FENTANYL CITRATE (PF) 100 MCG/2ML IJ SOLN
50.0000 ug | INTRAMUSCULAR | Status: DC | PRN
  Administered 2021-06-02: 50 ug via INTRAVENOUS

## 2021-05-25 MED ORDER — PANTOPRAZOLE SODIUM 40 MG IV SOLR
40.0000 mg | Freq: Every day | INTRAVENOUS | Status: DC
  Administered 2021-05-26: 40 mg via INTRAVENOUS
  Filled 2021-05-25: qty 40

## 2021-05-25 MED ORDER — MIDAZOLAM HCL 2 MG/2ML IJ SOLN
INTRAMUSCULAR | Status: AC
  Filled 2021-05-25: qty 2

## 2021-05-25 MED ORDER — FENTANYL CITRATE (PF) 250 MCG/5ML IJ SOLN
INTRAMUSCULAR | Status: AC
  Filled 2021-05-25: qty 5

## 2021-05-25 MED ORDER — FENTANYL CITRATE (PF) 100 MCG/2ML IJ SOLN
INTRAMUSCULAR | Status: AC | PRN
  Administered 2021-05-25: 50 ug via INTRAVENOUS

## 2021-05-25 MED ORDER — VANCOMYCIN HCL 1000 MG IV SOLR
INTRAVENOUS | Status: DC | PRN
  Administered 2021-05-25: 1000 mg via TOPICAL

## 2021-05-25 MED ORDER — ALBUMIN HUMAN 5 % IV SOLN: INTRAVENOUS | Status: DC | PRN

## 2021-05-25 MED ORDER — ROCURONIUM BROMIDE 100 MG/10ML IV SOLN
INTRAVENOUS | Status: DC | PRN
  Administered 2021-05-25: 40 mg via INTRAVENOUS
  Administered 2021-05-25: 60 mg via INTRAVENOUS

## 2021-05-25 MED ORDER — CLINDAMYCIN PHOSPHATE 900 MG/50ML IV SOLN
INTRAVENOUS | Status: DC | PRN
  Administered 2021-05-25: 900 mg via INTRAVENOUS

## 2021-05-25 MED ORDER — 0.9 % SODIUM CHLORIDE (POUR BTL) OPTIME
TOPICAL | Status: DC | PRN
  Administered 2021-05-25: 1000 mL

## 2021-05-25 MED ORDER — FENTANYL CITRATE (PF) 100 MCG/2ML IJ SOLN
50.0000 ug | Freq: Once | INTRAMUSCULAR | Status: AC
Start: 2021-05-25 — End: 2021-05-25
  Filled 2021-05-25: qty 2

## 2021-05-25 MED ORDER — PROPOFOL 10 MG/ML IV BOLUS
INTRAVENOUS | Status: DC | PRN
  Administered 2021-05-25: 100 mg via INTRAVENOUS

## 2021-05-25 MED ORDER — ACETAMINOPHEN 10 MG/ML IV SOLN
INTRAVENOUS | Status: AC
  Filled 2021-05-25: qty 100

## 2021-05-25 MED ORDER — PANTOPRAZOLE SODIUM 40 MG PO TBEC: 40.0000 mg | DELAYED_RELEASE_TABLET | Freq: Every day | ORAL | Status: DC

## 2021-05-25 MED ORDER — LEVOFLOXACIN IN D5W 750 MG/150ML IV SOLN
750.0000 mg | Freq: Once | INTRAVENOUS | Status: AC
  Administered 2021-05-25: 750 mg via INTRAVENOUS

## 2021-05-25 SURGICAL SUPPLY — 77 items
BAG COUNTER SPONGE SURGICOUNT (BAG) IMPLANT
BAG SPNG CNTER NS LX DISP (BAG)
BANDAGE ESMARK 6X9 LF (GAUZE/BANDAGES/DRESSINGS) ×5 IMPLANT
BAR EXFX 350X11 NS LF (EXFIX) ×10
BAR EXFX 400X11 NS LF (EXFIX) ×5
BAR EXFX 500X11 NS LF (EXFIX) ×5
BAR GLASS FIBER EXFX 11X350 (EXFIX) ×12 IMPLANT
BAR GLASS FIBER EXFX 11X400 (EXFIX) ×6 IMPLANT
BAR GLASS FIBER EXFX 11X500 (EXFIX) ×6 IMPLANT
BNDG CMPR 9X4 STRL LF SNTH (GAUZE/BANDAGES/DRESSINGS)
BNDG CMPR 9X6 STRL LF SNTH (GAUZE/BANDAGES/DRESSINGS) ×5
BNDG CMPR MED 10X6 ELC LF (GAUZE/BANDAGES/DRESSINGS) ×5
BNDG CMPR MED 15X6 ELC VLCR LF (GAUZE/BANDAGES/DRESSINGS) ×5
BNDG COHESIVE 4X5 TAN STRL (GAUZE/BANDAGES/DRESSINGS) IMPLANT
BNDG COHESIVE 6X5 TAN STRL LF (GAUZE/BANDAGES/DRESSINGS) ×6 IMPLANT
BNDG ELASTIC 4X5.8 VLCR STR LF (GAUZE/BANDAGES/DRESSINGS) ×6 IMPLANT
BNDG ELASTIC 6X10 VLCR STRL LF (GAUZE/BANDAGES/DRESSINGS) ×6 IMPLANT
BNDG ELASTIC 6X15 VLCR STRL LF (GAUZE/BANDAGES/DRESSINGS) ×6 IMPLANT
BNDG ESMARK 4X9 LF (GAUZE/BANDAGES/DRESSINGS) IMPLANT
BNDG ESMARK 6X9 LF (GAUZE/BANDAGES/DRESSINGS) ×6
BNDG GAUZE ELAST 4 BULKY (GAUZE/BANDAGES/DRESSINGS) ×18 IMPLANT
CLAMP BLUE BAR TO BAR (EXFIX) ×12 IMPLANT
CLAMP BLUE BAR TO PIN (EXFIX) ×48 IMPLANT
COVER SURGICAL LIGHT HANDLE (MISCELLANEOUS) ×18 IMPLANT
CUFF TOURN SGL QUICK 34 (TOURNIQUET CUFF) ×6
CUFF TRNQT CYL 34X4.125X (TOURNIQUET CUFF) ×5 IMPLANT
DRAPE HALF SHEET 40X57 (DRAPES) ×6 IMPLANT
DRAPE INCISE IOBAN 66X45 STRL (DRAPES) ×6 IMPLANT
DRAPE ORTHO SPLIT 77X108 STRL (DRAPES) ×12
DRAPE SURG ORHT 6 SPLT 77X108 (DRAPES) ×10 IMPLANT
DRAPE U-SHAPE 47X51 STRL (DRAPES) ×6 IMPLANT
DRSG PAD ABDOMINAL 8X10 ST (GAUZE/BANDAGES/DRESSINGS) IMPLANT
DURAPREP 26ML APPLICATOR (WOUND CARE) ×6 IMPLANT
ELECT REM PT RETURN 9FT ADLT (ELECTROSURGICAL) ×6
ELECTRODE REM PT RTRN 9FT ADLT (ELECTROSURGICAL) ×5 IMPLANT
GAUZE SPONGE 4X4 12PLY STRL (GAUZE/BANDAGES/DRESSINGS) IMPLANT
GAUZE SPONGE 4X4 12PLY STRL LF (GAUZE/BANDAGES/DRESSINGS) ×6 IMPLANT
GAUZE XEROFORM 1X8 LF (GAUZE/BANDAGES/DRESSINGS) ×6 IMPLANT
GAUZE XEROFORM 5X9 LF (GAUZE/BANDAGES/DRESSINGS) ×6 IMPLANT
GLOVE SRG 8 PF TXTR STRL LF DI (GLOVE) ×15 IMPLANT
GLOVE SURG ENC TEXT LTX SZ7.5 (GLOVE) ×6 IMPLANT
GLOVE SURG POLYISO LF SZ7.5 (GLOVE) ×18 IMPLANT
GLOVE SURG UNDER LTX SZ8 (GLOVE) ×6 IMPLANT
GLOVE SURG UNDER POLY LF SZ8 (GLOVE) ×18
GOWN STRL REUS W/ TWL LRG LVL3 (GOWN DISPOSABLE) ×5 IMPLANT
GOWN STRL REUS W/ TWL XL LVL3 (GOWN DISPOSABLE) ×5 IMPLANT
GOWN STRL REUS W/TWL LRG LVL3 (GOWN DISPOSABLE) ×6
GOWN STRL REUS W/TWL XL LVL3 (GOWN DISPOSABLE) ×6
HANDPIECE INTERPULSE COAX TIP (DISPOSABLE) ×6
K-WIRE .062 (WIRE) ×6
K-WIRE FX7.25X.062XNS KRSH (WIRE) ×5
KIT BASIN OR (CUSTOM PROCEDURE TRAY) ×6 IMPLANT
KIT TURNOVER KIT B (KITS) ×6 IMPLANT
KWIRE FX7.25X.062XNS KRSH (WIRE) ×5 IMPLANT
MANIFOLD NEPTUNE II (INSTRUMENTS) ×6 IMPLANT
NEEDLE 22X1 1/2 (OR ONLY) (NEEDLE) IMPLANT
NS IRRIG 1000ML POUR BTL (IV SOLUTION) ×6 IMPLANT
PACK ORTHO EXTREMITY (CUSTOM PROCEDURE TRAY) ×6 IMPLANT
PAD ARMBOARD 7.5X6 YLW CONV (MISCELLANEOUS) ×12 IMPLANT
PAD CAST 4YDX4 CTTN HI CHSV (CAST SUPPLIES) ×5 IMPLANT
PADDING CAST ABS 3INX4YD NS (CAST SUPPLIES) ×1
PADDING CAST ABS COTTON 3X4 (CAST SUPPLIES) ×5 IMPLANT
PADDING CAST COTTON 4X4 STRL (CAST SUPPLIES) ×6
PIN HALF ORANGE 5X200X45MM (EXFIX) ×24 IMPLANT
PIN HALF YELLOW 5X160X35 (EXFIX) ×24 IMPLANT
SET HNDPC FAN SPRY TIP SCT (DISPOSABLE) ×5 IMPLANT
SET IRRIG Y TYPE TUR BLADDER L (SET/KITS/TRAYS/PACK) ×6 IMPLANT
SPONGE T-LAP 18X18 ~~LOC~~+RFID (SPONGE) ×12 IMPLANT
STAPLER VISISTAT 35W (STAPLE) ×6 IMPLANT
STOCKINETTE IMPERVIOUS 9X36 MD (GAUZE/BANDAGES/DRESSINGS) IMPLANT
STOCKINETTE IMPERVIOUS LG (DRAPES) ×6 IMPLANT
SUT ETHILON 2 0 FS 18 (SUTURE) ×12 IMPLANT
SYR BULB IRRIG 60ML STRL (SYRINGE) ×6 IMPLANT
TRAY FOLEY MTR SLVR 14FR STAT (SET/KITS/TRAYS/PACK) ×6 IMPLANT
TUBE CONNECTING 12X1/4 (SUCTIONS) ×12 IMPLANT
UNDERPAD 30X36 HEAVY ABSORB (UNDERPADS AND DIAPERS) ×6 IMPLANT
YANKAUER SUCT BULB TIP NO VENT (SUCTIONS) ×12 IMPLANT
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
## 2021-05-25 NOTE — ED Notes (Signed)
Emergency blood infused
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
## 2021-05-25 NOTE — ED Notes (Signed)
Dr. Constance Holster at bedside suturing.
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
## 2021-05-25 NOTE — Anesthesia Procedure Notes (Signed)
Arterial Line Insertion Start/End7/07/2021 9:02 PM, 05/25/2021 9:06 PM Performed by: Jearld Pies, CRNA, CRNA  Patient location: OR. Emergency situation Right, radial was placed Catheter size: 20 G Hand hygiene performed , maximum sterile barriers used  and Seldinger technique used Allen's test indicative of satisfactory collateral circulation Attempts: 1 Procedure performed using ultrasound guided technique. Ultrasound Notes:anatomy identified and no ultrasound evidence of intravascular and/or intraneural injection Following insertion, dressing applied and Biopatch. Post procedure assessment: normal  Patient tolerated the procedure well with no immediate complications.
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
## 2021-05-25 NOTE — Anesthesia Procedure Notes (Signed)
Procedure Name: Intubation Date/Time: 05/25/2021 8:52 PM Performed by: Babs Bertin, CRNA Pre-anesthesia Checklist: Patient identified, Emergency Drugs available, Suction available and Patient being monitored Patient Re-evaluated:Patient Re-evaluated prior to induction Oxygen Delivery Method: Circle System Utilized Preoxygenation: Pre-oxygenation with 100% oxygen Induction Type: IV induction, Rapid sequence and Cricoid Pressure applied Laryngoscope Size: Glidescope Grade View: Grade I Tube type: Oral Tube size: 7.0 mm Number of attempts: 1 Airway Equipment and Method: Stylet and Oral airway Placement Confirmation: ETT inserted through vocal cords under direct vision, positive ETCO2 and breath sounds checked- equal and bilateral Secured at: 22 cm Tube secured with: Tape Dental Injury: Teeth and Oropharynx as per pre-operative assessment  Comments: C-collar maintained during intubation.

## 2021-05-25 NOTE — Anesthesia Preprocedure Evaluation (Addendum)
Anesthesia Evaluation  Patient identified by MRN, date of birth, ID band Patient awake    Reviewed: Allergy & Precautions, H&P , NPO status , Patient's Chart, lab work & pertinent test results  Airway Mallampati: III  TM Distance: >3 FB Neck ROM: Limited  Mouth opening: Limited Mouth Opening  Dental no notable dental hx. (+) Loose, Dental Advisory Given   Pulmonary neg pulmonary ROS,    Pulmonary exam normal breath sounds clear to auscultation       Cardiovascular negative cardio ROS   Rhythm:Regular Rate:Normal     Neuro/Psych negative neurological ROS  negative psych ROS   GI/Hepatic negative GI ROS, Neg liver ROS,   Endo/Other  negative endocrine ROS  Renal/GU negative Renal ROS  negative genitourinary   Musculoskeletal   Abdominal   Peds  Hematology negative hematology ROS (+)   Anesthesia Other Findings   Reproductive/Obstetrics negative OB ROS                            Anesthesia Physical Anesthesia Plan  ASA: 2 and emergent  Anesthesia Plan: General   Post-op Pain Management:    Induction: Intravenous, Rapid sequence and Cricoid pressure planned  PONV Risk Score and Plan: 4 or greater and Ondansetron, Dexamethasone and Midazolam  Airway Management Planned: Oral ETT and Video Laryngoscope Planned  Additional Equipment: Arterial line and CVP  Intra-op Plan:   Post-operative Plan: Possible Post-op intubation/ventilation  Informed Consent: I have reviewed the patients History and Physical, chart, labs and discussed the procedure including the risks, benefits and alternatives for the proposed anesthesia with the patient or authorized representative who has indicated his/her understanding and acceptance.     Dental advisory given  Plan Discussed with: CRNA  Anesthesia Plan Comments:        Anesthesia Quick Evaluation
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
## 2021-05-25 NOTE — ED Notes (Signed)
Verbal order to transfuse emergent blood by Grandville Silos MD. Blood bank called and notified.

## 2021-05-25 NOTE — ED Notes (Signed)
Patient transported to CT 

## 2021-05-25 NOTE — Transfer of Care (Signed)
Immediate Anesthesia Transfer of Care Note  Patient: Melissa Harrington  Procedure(s) Performed: EXTERNAL FIXATION LEG (Bilateral) IRRIGATION AND DEBRIDEMENT EXTREMITY (Bilateral: Knee) INSERTION OF TRACTION PIN (Right: Leg Upper) REPAIR MULTIPLE LACERATIONS (Bilateral) IRRIGATION AND DEBRIDEMENT EXTREMITY (Left: Arm Lower) CAST APPLICATION (Left: Arm Lower) CLOSED REDUCTION RADIUS  AND ULNAR FRACTURE (Left) CLOSED REDUCTION HUMERAL SHAFT (Left)  Patient Location: ICU  Anesthesia Type:General  Level of Consciousness: Patient remains intubated per anesthesia plan  Airway & Oxygen Therapy: Patient remains intubated per anesthesia plan and Patient placed on Ventilator (see vital sign flow sheet for setting)  Post-op Assessment: Report given to RN and Post -op Vital signs reviewed and stable  Post vital signs: Reviewed and stable  Last Vitals:  Vitals Value Taken Time  BP 124/92 05/25/21 2310  Temp    Pulse 87 05/25/21 2310  Resp 15 05/25/21 2313  SpO2 100 % 05/25/21 2310  Vitals shown include unvalidated device data.  Last Pain:  Vitals:   05/25/21 2023  TempSrc:   PainSc: 10-Worst pain ever         Complications: No notable events documented.

## 2021-05-25 NOTE — Anesthesia Procedure Notes (Signed)
Central Venous Catheter Insertion Performed by: Roderic Palau, MD, anesthesiologist Start/End7/07/2021 9:05 AM, 05/25/2021 9:20 AM Patient location: OR. Emergency situation Preanesthetic checklist: patient identified, IV checked, site marked, risks and benefits discussed, surgical consent, monitors and equipment checked, pre-op evaluation, timeout performed and anesthesia consent Position: Trendelenburg Patient sedated Hand hygiene performed , maximum sterile barriers used  and Seldinger technique used Catheter size: 8 Fr Total catheter length 16. Central line was placed.Double lumen Procedure performed without using ultrasound guided technique. Attempts: 2 (Attempted R [place]. Artery punctured. Needle withdrawn, pressure held.) Following insertion, dressing applied, line sutured and Biopatch. Post procedure assessment: blood return through all ports  Post procedure complications: arterial puncture. Patient tolerated the procedure well with no immediate complications.

## 2021-05-26 ENCOUNTER — Inpatient Hospital Stay (HOSPITAL_COMMUNITY): Payer: 59

## 2021-05-26 LAB — BPAM FFP
Blood Product Expiration Date: 202207132359
Blood Product Expiration Date: 202207142359
Blood Product Expiration Date: 202207142359
Blood Product Expiration Date: 202207142359
Blood Product Expiration Date: 202207142359
Blood Product Expiration Date: 202207142359
Blood Product Expiration Date: 202207142359
Blood Product Expiration Date: 202207142359
Blood Product Expiration Date: 202207142359
Blood Product Expiration Date: 202207142359
ISSUE DATE / TIME: 202207091738
ISSUE DATE / TIME: 202207091738
ISSUE DATE / TIME: 202207091741
ISSUE DATE / TIME: 202207091930
ISSUE DATE / TIME: 202207091930
ISSUE DATE / TIME: 202207092159
ISSUE DATE / TIME: 202207092159
ISSUE DATE / TIME: 202207092159
ISSUE DATE / TIME: 202207092159
ISSUE DATE / TIME: 202207100905
Unit Type and Rh: 5100
Unit Type and Rh: 5100
Unit Type and Rh: 5100
Unit Type and Rh: 600
Unit Type and Rh: 600
Unit Type and Rh: 600
Unit Type and Rh: 6200
Unit Type and Rh: 6200
Unit Type and Rh: 6200
Unit Type and Rh: 9500

## 2021-05-26 LAB — CBC
HCT: 34.1 % — ABNORMAL LOW (ref 36.0–46.0)
HCT: 34.5 % — ABNORMAL LOW (ref 36.0–46.0)
HCT: 31.6 % — ABNORMAL LOW (ref 36.0–46.0)
Hemoglobin: 11.7 g/dL — ABNORMAL LOW (ref 12.0–15.0)
Hemoglobin: 11.9 g/dL — ABNORMAL LOW (ref 12.0–15.0)
Hemoglobin: 10.9 g/dL — ABNORMAL LOW (ref 12.0–15.0)
MCH: 30.1 pg (ref 26.0–34.0)
MCH: 30 pg (ref 26.0–34.0)
MCH: 30.4 pg (ref 26.0–34.0)
MCHC: 34.3 g/dL (ref 30.0–36.0)
MCHC: 34.5 g/dL (ref 30.0–36.0)
MCHC: 34.5 g/dL (ref 30.0–36.0)
MCV: 87.7 fL (ref 80.0–100.0)
MCV: 86.9 fL (ref 80.0–100.0)
MCV: 88 fL (ref 80.0–100.0)
Platelets: 88 10*3/uL — ABNORMAL LOW (ref 150–400)
Platelets: 92 10*3/uL — ABNORMAL LOW (ref 150–400)
Platelets: 91 10*3/uL — ABNORMAL LOW (ref 150–400)
RBC: 3.89 MIL/uL (ref 3.87–5.11)
RBC: 3.97 MIL/uL (ref 3.87–5.11)
RBC: 3.59 MIL/uL — ABNORMAL LOW (ref 3.87–5.11)
RDW: 16.7 % — ABNORMAL HIGH (ref 11.5–15.5)
RDW: 16.9 % — ABNORMAL HIGH (ref 11.5–15.5)
RDW: 18.4 % — ABNORMAL HIGH (ref 11.5–15.5)
WBC: 7.7 10*3/uL (ref 4.0–10.5)
WBC: 6.5 10*3/uL (ref 4.0–10.5)
WBC: 7.3 10*3/uL (ref 4.0–10.5)
nRBC: 0 % (ref 0.0–0.2)
nRBC: 0 % (ref 0.0–0.2)
nRBC: 0 % (ref 0.0–0.2)

## 2021-05-26 LAB — COMPREHENSIVE METABOLIC PANEL
ALT: 75 U/L — ABNORMAL HIGH (ref 0–44)
ALT: 89 U/L — ABNORMAL HIGH (ref 0–44)
AST: 159 U/L — ABNORMAL HIGH (ref 15–41)
AST: 195 U/L — ABNORMAL HIGH (ref 15–41)
Albumin: 2.9 g/dL — ABNORMAL LOW (ref 3.5–5.0)
Albumin: 3 g/dL — ABNORMAL LOW (ref 3.5–5.0)
Alkaline Phosphatase: 41 U/L (ref 38–126)
Alkaline Phosphatase: 43 U/L (ref 38–126)
Anion gap: 13 (ref 5–15)
Anion gap: 13 (ref 5–15)
BUN: 14 mg/dL (ref 6–20)
BUN: 14 mg/dL (ref 6–20)
CO2: 20 mmol/L — ABNORMAL LOW (ref 22–32)
CO2: 21 mmol/L — ABNORMAL LOW (ref 22–32)
Calcium: 8.3 mg/dL — ABNORMAL LOW (ref 8.9–10.3)
Calcium: 8.4 mg/dL — ABNORMAL LOW (ref 8.9–10.3)
Chloride: 104 mmol/L (ref 98–111)
Chloride: 105 mmol/L (ref 98–111)
Creatinine, Ser: 0.88 mg/dL (ref 0.44–1.00)
Creatinine, Ser: 0.99 mg/dL (ref 0.44–1.00)
GFR, Estimated: >60 mL/min (ref >60)
GFR, Estimated: >60 mL/min (ref >60)
Glucose, Bld: 182 mg/dL — ABNORMAL HIGH (ref 70–99)
Glucose, Bld: 196 mg/dL — ABNORMAL HIGH (ref 70–99)
Potassium: 3.3 mmol/L — ABNORMAL LOW (ref 3.5–5.1)
Potassium: 3.7 mmol/L (ref 3.5–5.1)
Sodium: 138 mmol/L (ref 135–145)
Sodium: 138 mmol/L (ref 135–145)
Total Bilirubin: 2.7 mg/dL — ABNORMAL HIGH (ref 0.3–1.2)
Total Bilirubin: 3.4 mg/dL — ABNORMAL HIGH (ref 0.3–1.2)
Total Protein: 4.6 g/dL — ABNORMAL LOW (ref 6.5–8.1)
Total Protein: 4.8 g/dL — ABNORMAL LOW (ref 6.5–8.1)

## 2021-05-26 LAB — PREPARE FRESH FROZEN PLASMA
Unit division: 0
Unit division: 0
Unit division: 0
Unit division: 0
Unit division: 0
Unit division: 0
Unit division: 0

## 2021-05-26 LAB — URINALYSIS, ROUTINE W REFLEX MICROSCOPIC
Bacteria, UA: NONE SEEN
Bilirubin Urine: NEGATIVE
Glucose, UA: NEGATIVE mg/dL
Ketones, ur: NEGATIVE mg/dL
Leukocytes,Ua: NEGATIVE
Nitrite: NEGATIVE
Protein, ur: NEGATIVE mg/dL
Specific Gravity, Urine: 1.031 — ABNORMAL HIGH (ref 1.005–1.030)
pH: 5 (ref 5.0–8.0)

## 2021-05-26 LAB — PROTIME-INR
INR: 1.1 (ref 0.8–1.2)
Prothrombin Time: 14.5 seconds (ref 11.4–15.2)

## 2021-05-26 LAB — HIV ANTIBODY (ROUTINE TESTING W REFLEX): HIV Screen 4th Generation wRfx: NONREACTIVE

## 2021-05-26 LAB — BLOOD PRODUCT ORDER (VERBAL) VERIFICATION

## 2021-05-26 LAB — MRSA NEXT GEN BY PCR, NASAL: MRSA by PCR Next Gen: NOT DETECTED

## 2021-05-26 MED ORDER — NOREPINEPHRINE 4 MG/250ML-% IV SOLN
INTRAVENOUS | Status: AC
  Filled 2021-05-26: qty 250

## 2021-05-26 MED ORDER — METHOCARBAMOL 500 MG PO TABS
1000.0000 mg | ORAL_TABLET | Freq: Three times a day (TID) | ORAL | Status: DC
  Administered 2021-05-26 – 2021-06-15 (×59): 1000 mg
  Filled 2021-05-26 (×59): qty 2

## 2021-05-26 MED ORDER — OXYCODONE HCL 5 MG/5ML PO SOLN
5.0000 mg | ORAL | Status: DC | PRN
  Administered 2021-05-26 – 2021-06-06 (×15): 10 mg
  Filled 2021-05-26 (×16): qty 10

## 2021-05-26 MED ORDER — LACTATED RINGERS IV SOLN: INTRAVENOUS | Status: DC

## 2021-05-26 MED ORDER — ALBUMIN HUMAN 5 % IV SOLN
25.0000 g | Freq: Once | INTRAVENOUS | Status: AC
  Administered 2021-05-26: 25 g via INTRAVENOUS
  Filled 2021-05-26: qty 500

## 2021-05-26 MED ORDER — DIPHENHYDRAMINE HCL 50 MG/ML IJ SOLN
INTRAMUSCULAR | Status: AC
  Administered 2021-05-26: 25 mg via INTRAVENOUS
  Filled 2021-05-26: qty 1

## 2021-05-26 MED ORDER — DIPHENHYDRAMINE HCL 50 MG/ML IJ SOLN
25.0000 mg | Freq: Four times a day (QID) | INTRAMUSCULAR | Status: DC | PRN
  Administered 2021-05-26 – 2021-06-08 (×2): 25 mg via INTRAVENOUS
  Filled 2021-05-26: qty 1

## 2021-05-26 MED ORDER — PROPOFOL 1000 MG/100ML IV EMUL
5.0000 ug/kg/min | INTRAVENOUS | Status: DC
  Administered 2021-05-26: 40 ug/kg/min via INTRAVENOUS
  Administered 2021-05-26 (×2): 30 ug/kg/min via INTRAVENOUS
  Administered 2021-05-26 – 2021-05-27 (×3): 50 ug/kg/min via INTRAVENOUS
  Administered 2021-05-27: 30 ug/kg/min via INTRAVENOUS
  Administered 2021-05-27: 50 ug/kg/min via INTRAVENOUS
  Administered 2021-05-28 (×2): 30 ug/kg/min via INTRAVENOUS
  Administered 2021-05-28 (×2): 35 ug/kg/min via INTRAVENOUS
  Administered 2021-05-29: 50 ug/kg/min via INTRAVENOUS
  Administered 2021-05-29: 30 ug/kg/min via INTRAVENOUS
  Administered 2021-05-29: 50 ug/kg/min via INTRAVENOUS
  Administered 2021-05-29: 45 ug/kg/min via INTRAVENOUS
  Administered 2021-05-29: 35 ug/kg/min via INTRAVENOUS
  Administered 2021-05-30 – 2021-06-01 (×9): 30 ug/kg/min via INTRAVENOUS
  Filled 2021-05-26 (×26): qty 100

## 2021-05-26 MED ORDER — CHLORHEXIDINE GLUCONATE 0.12% ORAL RINSE (MEDLINE KIT)
15.0000 mL | Freq: Two times a day (BID) | OROMUCOSAL | Status: DC
  Administered 2021-05-26 – 2021-06-08 (×28): 15 mL via OROMUCOSAL

## 2021-05-26 MED ORDER — ORAL CARE MOUTH RINSE
15.0000 mL | OROMUCOSAL | Status: DC
  Administered 2021-05-26 – 2021-06-08 (×127): 15 mL via OROMUCOSAL

## 2021-05-26 MED ORDER — CHLORHEXIDINE GLUCONATE CLOTH 2 % EX PADS
6.0000 | MEDICATED_PAD | Freq: Every day | CUTANEOUS | Status: DC
  Administered 2021-05-27: 6 via TOPICAL

## 2021-05-26 MED ORDER — NOREPINEPHRINE 16 MG/250ML-% IV SOLN
0.0000 ug/min | INTRAVENOUS | Status: DC
  Administered 2021-05-26: 5 ug/min via INTRAVENOUS
  Administered 2021-05-26: 18 ug/min via INTRAVENOUS
  Administered 2021-05-28: 14 ug/min via INTRAVENOUS
  Administered 2021-05-29: 9 ug/min via INTRAVENOUS
  Administered 2021-05-29: 34 ug/min via INTRAVENOUS
  Administered 2021-05-30: 28 ug/min via INTRAVENOUS
  Administered 2021-05-30: 36 ug/min via INTRAVENOUS
  Administered 2021-05-30: 28 ug/min via INTRAVENOUS
  Administered 2021-05-31: 26 ug/min via INTRAVENOUS
  Filled 2021-05-26 (×11): qty 250

## 2021-05-26 MED ORDER — FENTANYL BOLUS VIA INFUSION
50.0000 ug | INTRAVENOUS | Status: DC | PRN
  Administered 2021-05-26 – 2021-06-05 (×11): 50 ug via INTRAVENOUS
  Filled 2021-05-26: qty 50

## 2021-05-26 MED ORDER — FENTANYL CITRATE (PF) 100 MCG/2ML IJ SOLN: 50.0000 ug | Freq: Once | INTRAMUSCULAR | Status: AC

## 2021-05-26 MED ORDER — FENTANYL 2500MCG IN NS 250ML (10MCG/ML) PREMIX INFUSION
50.0000 ug/h | INTRAVENOUS | Status: DC
  Administered 2021-05-26: 150 ug/h via INTRAVENOUS
  Administered 2021-05-26: 50 ug/h via INTRAVENOUS
  Administered 2021-05-27: 100 ug/h via INTRAVENOUS
  Administered 2021-05-28 (×2): 125 ug/h via INTRAVENOUS
  Administered 2021-05-29: 200 ug/h via INTRAVENOUS
  Administered 2021-05-30 – 2021-05-31 (×3): 150 ug/h via INTRAVENOUS
  Administered 2021-06-01: 125 ug/h via INTRAVENOUS
  Administered 2021-06-01 – 2021-06-02 (×2): 150 ug/h via INTRAVENOUS
  Filled 2021-05-26 (×13): qty 250

## 2021-05-26 MED ORDER — ACETAMINOPHEN 500 MG PO TABS
1000.0000 mg | ORAL_TABLET | Freq: Four times a day (QID) | ORAL | Status: DC
  Administered 2021-05-26 – 2021-06-15 (×71): 1000 mg
  Filled 2021-05-26 (×74): qty 2

## 2021-05-26 NOTE — Progress Notes (Addendum)
Chaplain made initial contact with family. Pt is not awake. Pt's daughter and aunt are bedside. Another daughter is in hall near window making calls.  Provided emotional support and orientation to spiritual care services. Family is very active in their church in [place].  After initial visit, daughter in hallway indicated prayer is meaningful to pt; so chaplain returned to offer prayer with pt and aunt.  Daughters are ~19 and 21; they appear to be in task mode with controlled emotions and grief very close to the surface. There is a 62 year old son at home, who, according to aunt, is struggling the most.    Pt's aunt indicates that the children/family very active in church. Children were raised to be strong in the face of adversity, to "get over their emotions and themselves, and to suck it up," which aunt perceives as a strength. Chaplain affirmed the blessing of faith in times of crisis, and encouraged the importance of being open to multiple layers of support.   RN supervisor requested our the support Spiritual Care along with SW and medical staff to support family in disclosing to pt the death of her spouse in [facility]; this referral has been made. Deceased husband was firefighter [employer] on [employer]). Family's church and husband's fire station are providing mountains of support, per aunt, including vigils outside hospital entrance.  Please page for support as needed or requested.  Floor chaplain will also follow pt/family.   Luana Shu [phone]    05/26/21 1325  Clinical Encounter Type  Visited With Patient and family together  Visit Type Initial;Trauma;Spiritual support  Referral From Nurse  Consult/Referral To Chaplain  Spiritual Encounters  Spiritual Needs Prayer  Stress Factors  Patient Stress Factors Family relationships;Health changes;Major life changes;Lack of knowledge
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
## 2021-05-26 NOTE — Progress Notes (Signed)
RT transported patient from 4N22 to CT and back with RN. No complications and vital signs stable. RT will continue to monitor.

## 2021-05-26 NOTE — Progress Notes (Signed)
Orthopedic Tech Progress Note Patient Details:  [name] 26-Jul-1971 [id number] Reapplied skeletal traction to patient's right leg  Patient ID: Melissa Harrington, female   DOB: [birth year], 50 y.o.   MRN: [id number]  Jearld Lesch 05/26/2021, 12:09 PM

## 2021-05-26 NOTE — Progress Notes (Signed)
Orthopedic Tech Progress Note Patient Details:  Melissa Harrington December 17, 1970 [id number]  Musculoskeletal Traction Type of Traction: Skeletal (Balanced Suspension) Traction Location: rle Traction Weight: 15 lbs   Post Interventions Patient Tolerated: Well Instructions Provided: Care of device, Adjustment of device  Karolee Stamps 05/26/2021, 2:38 AM

## 2021-05-26 NOTE — Progress Notes (Signed)
When assessing patient wounds, this RN and Orie Rout, RN found rash-like areas on perineum, abdomen, and chest. Areas were reddened and warm to the touch. Spoke with Dr. Grandville Silos on the phone regarding rash. Order placed for PRN benadryl, which was then administered.  Notified patient's daughters at bedside of rash. Patient's daughters report that patient has a history of sensitive skin and has had rashes previously from minor allergens such as specific detergents and grass.

## 2021-05-26 NOTE — Progress Notes (Signed)
     Melissa Harrington is a 50 y.o. female   Orthopaedic diagnosis: PreOperative Diagnosis: Right acetabulum fracture dislocation Right distal femur fracture Left femur fracture Left humerus fracture Left open distal both bones forearm fracture Right knee traumatic laceration, 14 cm Right leg traumatic laceration, 10 cm Left superior and inferior pubic ramus fractures Right fifth metatarsal base fracture   PROCEDURE 05/25/21 Closed reduction of right hip and acetabulum fracture dislocation External fixator placement to right femur, knee spanning Irrigation and excisional debridement of right knee traumatic laceration, 14 cm Irrigation and excisional debridement of right leg traumatic laceration, 10 cm Complex closure of right knee laceration, 14 cm Complex closure of right leg laceration, 10 cm Placement of pin for skeletal traction Left femur external fixator placement Irrigation and debridement at site of open fracture, left forearm Closure of left forearm laceration, simple 5 cm Closed reduction of left humerus fracture with manipulation and splint placement Closed treatment of right fifth metatarsal base fracture  Subjective: Patient seen in the ICU.  She remains intubated.  Per nursing staff she is following directions appropriately.  She has been placed in traction on the right lower extremity due to her acetabulum fracture.  Objectyive: Vitals:   05/26/21 0630 05/26/21 0645  BP:    Pulse: 96 92  Resp: 20 20  Temp:    SpO2: 95% 95%     Exam: Intubated and sedated.  Patient is seen to move the right upper extremity through the shoulder.  She is not following commands for me.  Bilateral lower extremities with external fixator is in place.  Splint to the left upper extremity.  There is swelling and ecchymosis to the hand.  Doppler signal DP and PT pulse bilaterally.  Dressing intact.    Assessment: Postop day 1 status post above.   Plan: Given the severity of  her injuries as noted above we will talk to the orthopedic trauma team about her and the need for further surgery.  She will likely require definitive fixation of her right acetabulum, bilateral femurs, left humerus and left forearm fractures.  Bedrest for now.  ICU care per trauma and critical care.   Radene Journey, MD

## 2021-05-26 NOTE — Progress Notes (Signed)
She is intubated after surgery.  Her right leg is in traction.  Left arm is in a sling.  She is awake and signing with her right hand.  She is answering yes/no questions.  Her CT scan is scheduled for 11:00.

## 2021-05-26 NOTE — Progress Notes (Signed)
Follow-up exam.  She had orthopedic surgery last night.  She is currently intubated and sedated.  The facial and scalp lacerations are nicely approximated.  No signs of infection.  The maxillofacial CT official report was reviewed.  There is a tiny minimally displaced fracture of the right external auditory canal that seems to be of no clinical significance.  There is anterior displacement of the right mandibular condyle.  No bony fracture.  Superior lateral orbital fractures will not require treatment.  There is no LeFort type fracture or mandibular fracture.  The biggest concern is the anterior and posterior table frontal sinus fractures.  The anterior cable defect may cause cosmetic issues down the road but more importantly the functional issues and the drainage pathways of the frontal sinuses may be at risk.  I think for now we can follow this and see clinically how this heals up.  It is possible that she may need either frontal sinus obliteration or cranialization at some point in the future.  We will continue to follow.

## 2021-05-27 ENCOUNTER — Inpatient Hospital Stay (HOSPITAL_COMMUNITY): Payer: 59 | Admitting: Certified Registered"

## 2021-05-27 ENCOUNTER — Encounter (HOSPITAL_COMMUNITY): Payer: Self-pay | Admitting: Orthopaedic Surgery

## 2021-05-27 ENCOUNTER — Inpatient Hospital Stay (HOSPITAL_COMMUNITY): Payer: 59

## 2021-05-27 ENCOUNTER — Encounter (HOSPITAL_COMMUNITY): Admission: EM | Disposition: A | Discharge status: Rehabilitation Facility | Payer: Self-pay | Source: Home / Self Care

## 2021-05-27 HISTORY — PX: ORIF FEMUR FRACTURE: SHX2119

## 2021-05-27 HISTORY — PX: FEMUR IM NAIL: SHX1597

## 2021-05-27 LAB — POCT I-STAT 7, (LYTES, BLD GAS, ICA,H+H)
Acid-Base Excess: 2 mmol/L (ref 0.0–2.0)
Acid-Base Excess: 0 mmol/L (ref 0.0–2.0)
Acid-Base Excess: 3 mmol/L — ABNORMAL HIGH (ref 0.0–2.0)
Acid-base deficit: 1 mmol/L (ref 0.0–2.0)
Acid-base deficit: 1 mmol/L (ref 0.0–2.0)
Bicarbonate: 26.8 mmol/L (ref 20.0–28.0)
Bicarbonate: 24 mmol/L (ref 20.0–28.0)
Bicarbonate: 25.2 mmol/L (ref 20.0–28.0)
Bicarbonate: 24.6 mmol/L (ref 20.0–28.0)
Bicarbonate: 28 mmol/L (ref 20.0–28.0)
Calcium, Ion: 1.15 mmol/L (ref 1.15–1.40)
Calcium, Ion: 1.11 mmol/L — ABNORMAL LOW (ref 1.15–1.40)
Calcium, Ion: 1.13 mmol/L — ABNORMAL LOW (ref 1.15–1.40)
Calcium, Ion: 1.21 mmol/L (ref 1.15–1.40)
Calcium, Ion: 1.2 mmol/L (ref 1.15–1.40)
HCT: 26 % — ABNORMAL LOW (ref 36.0–46.0)
HCT: 24 % — ABNORMAL LOW (ref 36.0–46.0)
HCT: 24 % — ABNORMAL LOW (ref 36.0–46.0)
HCT: 23 % — ABNORMAL LOW (ref 36.0–46.0)
HCT: 17 % — ABNORMAL LOW (ref 36.0–46.0)
Hemoglobin: 8.8 g/dL — ABNORMAL LOW (ref 12.0–15.0)
Hemoglobin: 8.2 g/dL — ABNORMAL LOW (ref 12.0–15.0)
Hemoglobin: 8.2 g/dL — ABNORMAL LOW (ref 12.0–15.0)
Hemoglobin: 7.8 g/dL — ABNORMAL LOW (ref 12.0–15.0)
Hemoglobin: 5.8 g/dL — CL (ref 12.0–15.0)
O2 Saturation: 99 %
O2 Saturation: 87 %
O2 Saturation: 89 %
O2 Saturation: 93 %
O2 Saturation: 100 %
Patient temperature: 37.4
Patient temperature: 36.6
Patient temperature: 36.8
Patient temperature: 36.4
Patient temperature: 98
Potassium: 3.1 mmol/L — ABNORMAL LOW (ref 3.5–5.1)
Potassium: 3 mmol/L — ABNORMAL LOW (ref 3.5–5.1)
Potassium: 3 mmol/L — ABNORMAL LOW (ref 3.5–5.1)
Potassium: 3.4 mmol/L — ABNORMAL LOW (ref 3.5–5.1)
Potassium: 3.2 mmol/L — ABNORMAL LOW (ref 3.5–5.1)
Sodium: 142 mmol/L (ref 135–145)
Sodium: 142 mmol/L (ref 135–145)
Sodium: 142 mmol/L (ref 135–145)
Sodium: 142 mmol/L (ref 135–145)
Sodium: 143 mmol/L (ref 135–145)
TCO2: 28 mmol/L (ref 22–32)
TCO2: 25 mmol/L (ref 22–32)
TCO2: 27 mmol/L (ref 22–32)
TCO2: 26 mmol/L (ref 22–32)
TCO2: 29 mmol/L (ref 22–32)
pCO2 arterial: 43.3 mmHg (ref 32.0–48.0)
pCO2 arterial: 42 mmHg (ref 32.0–48.0)
pCO2 arterial: 45.6 mmHg (ref 32.0–48.0)
pCO2 arterial: 39.3 mmHg (ref 32.0–48.0)
pCO2 arterial: 45.3 mmHg (ref 32.0–48.0)
pH, Arterial: 7.401 (ref 7.350–7.450)
pH, Arterial: 7.348 — ABNORMAL LOW (ref 7.350–7.450)
pH, Arterial: 7.364 (ref 7.350–7.450)
pH, Arterial: 7.403 (ref 7.350–7.450)
pH, Arterial: 7.398 (ref 7.350–7.450)
pO2, Arterial: 121 mmHg — ABNORMAL HIGH (ref 83.0–108.0)
pO2, Arterial: 55 mmHg — ABNORMAL LOW (ref 83.0–108.0)
pO2, Arterial: 58 mmHg — ABNORMAL LOW (ref 83.0–108.0)
pO2, Arterial: 66 mmHg — ABNORMAL LOW (ref 83.0–108.0)
pO2, Arterial: 374 mmHg — ABNORMAL HIGH (ref 83.0–108.0)

## 2021-05-27 LAB — BASIC METABOLIC PANEL
Anion gap: 8 (ref 5–15)
BUN: 7 mg/dL (ref 6–20)
CO2: 25 mmol/L (ref 22–32)
Calcium: 7.8 mg/dL — ABNORMAL LOW (ref 8.9–10.3)
Chloride: 106 mmol/L (ref 98–111)
Creatinine, Ser: 0.91 mg/dL (ref 0.44–1.00)
GFR, Estimated: >60 mL/min (ref >60)
Glucose, Bld: 160 mg/dL — ABNORMAL HIGH (ref 70–99)
Potassium: 2.8 mmol/L — ABNORMAL LOW (ref 3.5–5.1)
Sodium: 139 mmol/L (ref 135–145)

## 2021-05-27 LAB — CBC
HCT: 29.2 % — ABNORMAL LOW (ref 36.0–46.0)
Hemoglobin: 10 g/dL — ABNORMAL LOW (ref 12.0–15.0)
MCH: 30.5 pg (ref 26.0–34.0)
MCHC: 34.2 g/dL (ref 30.0–36.0)
MCV: 89 fL (ref 80.0–100.0)
Platelets: 70 10*3/uL — ABNORMAL LOW (ref 150–400)
RBC: 3.28 MIL/uL — ABNORMAL LOW (ref 3.87–5.11)
RDW: 18.5 % — ABNORMAL HIGH (ref 11.5–15.5)
WBC: 5.8 10*3/uL (ref 4.0–10.5)
nRBC: 0 % (ref 0.0–0.2)

## 2021-05-27 LAB — HEMOGLOBIN AND HEMATOCRIT, BLOOD
HCT: 26 % — ABNORMAL LOW (ref 36.0–46.0)
Hemoglobin: 9 g/dL — ABNORMAL LOW (ref 12.0–15.0)

## 2021-05-27 LAB — HEPARIN LEVEL (UNFRACTIONATED): Heparin Unfractionated: 0.1 IU/mL — ABNORMAL LOW (ref 0.30–0.70)

## 2021-05-27 LAB — TRIGLYCERIDES: Triglycerides: 546 mg/dL — ABNORMAL HIGH (ref <150)

## 2021-05-27 LAB — SURGICAL PCR SCREEN
MRSA, PCR: NEGATIVE
Staphylococcus aureus: NEGATIVE

## 2021-05-27 LAB — PREPARE RBC (CROSSMATCH)

## 2021-05-27 SURGERY — INSERTION, INTRAMEDULLARY ROD, FEMUR
Anesthesia: General | Site: Shoulder | Laterality: Right

## 2021-05-27 MED ORDER — HEPARIN (PORCINE) 25000 UT/250ML-% IV SOLN
1750.0000 [IU]/h | INTRAVENOUS | Status: DC
  Administered 2021-05-27: 1250 [IU]/h via INTRAVENOUS
  Administered 2021-05-28: 1550 [IU]/h via INTRAVENOUS
  Administered 2021-05-29: 1800 [IU]/h via INTRAVENOUS
  Administered 2021-05-29: 1700 [IU]/h via INTRAVENOUS
  Filled 2021-05-27 (×6): qty 250

## 2021-05-27 MED ORDER — IOHEXOL 350 MG/ML SOLN
75.0000 mL | Freq: Once | INTRAVENOUS | Status: AC | PRN
  Administered 2021-05-27: 75 mL via INTRAVENOUS

## 2021-05-27 MED ORDER — SODIUM CHLORIDE 0.9 % IR SOLN
Status: DC | PRN
  Administered 2021-05-27: 6000 mL

## 2021-05-27 MED ORDER — ROCURONIUM BROMIDE 10 MG/ML (PF) SYRINGE
PREFILLED_SYRINGE | INTRAVENOUS | Status: AC
  Filled 2021-05-27: qty 20

## 2021-05-27 MED ORDER — MIDAZOLAM HCL 2 MG/2ML IJ SOLN
INTRAMUSCULAR | Status: AC
  Filled 2021-05-27: qty 2

## 2021-05-27 MED ORDER — MIDAZOLAM HCL 2 MG/2ML IJ SOLN
INTRAMUSCULAR | Status: DC | PRN
  Administered 2021-05-27: 2 mg via INTRAVENOUS

## 2021-05-27 MED ORDER — SODIUM CHLORIDE 0.9 % IV SOLN: 2.0000 g | Freq: Three times a day (TID) | INTRAVENOUS | Status: DC

## 2021-05-27 MED ORDER — CEFAZOLIN SODIUM 1 G IJ SOLR
INTRAMUSCULAR | Status: AC
  Filled 2021-05-27: qty 20

## 2021-05-27 MED ORDER — CLINDAMYCIN PHOSPHATE 900 MG/50ML IV SOLN: 900.0000 mg | Freq: Three times a day (TID) | INTRAVENOUS | Status: DC

## 2021-05-27 MED ORDER — LACTATED RINGERS IV SOLN: INTRAVENOUS | Status: DC | PRN

## 2021-05-27 MED ORDER — CALCIUM CHLORIDE 10 % IV SOLN
INTRAVENOUS | Status: DC | PRN
  Administered 2021-05-27: 1 g via INTRAVENOUS

## 2021-05-27 MED ORDER — VANCOMYCIN HCL 1000 MG IV SOLR
INTRAVENOUS | Status: DC | PRN
  Administered 2021-05-27: 1000 mg via TOPICAL

## 2021-05-27 MED ORDER — VANCOMYCIN HCL 1000 MG IV SOLR
INTRAVENOUS | Status: AC
  Filled 2021-05-27: qty 3000

## 2021-05-27 MED ORDER — ALBUMIN HUMAN 5 % IV SOLN: INTRAVENOUS | Status: DC | PRN

## 2021-05-27 MED ORDER — CEFAZOLIN SODIUM-DEXTROSE 2-3 GM-%(50ML) IV SOLR
INTRAVENOUS | Status: DC | PRN
  Administered 2021-05-27: 2 g via INTRAVENOUS

## 2021-05-27 MED ORDER — ROCURONIUM BROMIDE 10 MG/ML (PF) SYRINGE
PREFILLED_SYRINGE | INTRAVENOUS | Status: DC | PRN
  Administered 2021-05-27 (×3): 100 mg via INTRAVENOUS

## 2021-05-27 MED ORDER — POTASSIUM CHLORIDE 10 MEQ/50ML IV SOLN
10.0000 meq | INTRAVENOUS | Status: AC
Start: 2021-05-27 — End: 2021-05-27
  Administered 2021-05-27 (×5): 10 meq via INTRAVENOUS
  Filled 2021-05-27 (×4): qty 50

## 2021-05-27 MED ORDER — SODIUM CHLORIDE 0.9 % IV SOLN
2.0000 g | INTRAVENOUS | Status: DC
  Administered 2021-05-27 – 2021-05-31 (×5): 2 g via INTRAVENOUS
  Filled 2021-05-27 (×2): qty 2
  Filled 2021-05-27 (×3): qty 20
  Filled 2021-05-27: qty 2

## 2021-05-27 MED ORDER — SODIUM CHLORIDE 0.9% IV SOLUTION: Freq: Once | INTRAVENOUS | Status: AC

## 2021-05-27 MED ORDER — TOBRAMYCIN SULFATE 1.2 G IJ SOLR
INTRAMUSCULAR | Status: AC
  Filled 2021-05-27: qty 2.4

## 2021-05-27 MED ORDER — SODIUM CHLORIDE (PF) 0.9 % IJ SOLN
INTRAMUSCULAR | Status: AC
  Filled 2021-05-27: qty 10

## 2021-05-27 MED ORDER — 0.9 % SODIUM CHLORIDE (POUR BTL) OPTIME
TOPICAL | Status: DC | PRN
  Administered 2021-05-27: 1000 mL

## 2021-05-27 SURGICAL SUPPLY — 144 items
BAG COUNTER SPONGE SURGICOUNT (BAG) ×4 IMPLANT
BAG SPNG CNTER NS LX DISP (BAG) ×3
BIT DRILL 4.3 (BIT) ×4
BIT DRILL 4.3X300MM (BIT) ×3 IMPLANT
BIT DRILL CALIBRATED 4.3MMX365 (DRILL) ×3 IMPLANT
BIT DRILL CALIBRTD SHORT 4.9MM (BIT) ×3 IMPLANT
BIT DRILL CROWE PNT TWST 4.5MM (DRILL) ×3 IMPLANT
BIT DRILL LONG 3.3 (BIT) ×4 IMPLANT
BIT DRILL QC 3.3X195 (BIT) ×4 IMPLANT
BIT DRILL STEP 3.5 (DRILL) IMPLANT
BLADE AVERAGE 25X9 (BLADE) IMPLANT
BLADE CLIPPER SURG (BLADE) ×4 IMPLANT
BLADE SURG 10 STRL SS (BLADE) ×4 IMPLANT
BNDG CMPR 9X4 STRL LF SNTH (GAUZE/BANDAGES/DRESSINGS) ×3
BNDG CMPR MED 10X6 ELC LF (GAUZE/BANDAGES/DRESSINGS) ×15
BNDG COHESIVE 4X5 TAN STRL (GAUZE/BANDAGES/DRESSINGS) ×4 IMPLANT
BNDG COHESIVE 6X5 TAN STRL LF (GAUZE/BANDAGES/DRESSINGS) IMPLANT
BNDG ELASTIC 4X5.8 VLCR STR LF (GAUZE/BANDAGES/DRESSINGS) ×16 IMPLANT
BNDG ELASTIC 6X10 VLCR STRL LF (GAUZE/BANDAGES/DRESSINGS) ×20 IMPLANT
BNDG ELASTIC 6X5.8 VLCR STR LF (GAUZE/BANDAGES/DRESSINGS) ×4 IMPLANT
BNDG ESMARK 4X9 LF (GAUZE/BANDAGES/DRESSINGS) ×4 IMPLANT
BNDG GAUZE ELAST 4 BULKY (GAUZE/BANDAGES/DRESSINGS) ×8 IMPLANT
BRUSH SCRUB EZ PLAIN DRY (MISCELLANEOUS) ×8 IMPLANT
CANISTER SUCT 3000ML PPV (MISCELLANEOUS) ×4 IMPLANT
CAP LOCK NCB (Cap) ×24 IMPLANT
CORD BIPOLAR FORCEPS 12FT (ELECTRODE) IMPLANT
COVER PERINEAL POST (MISCELLANEOUS) ×4 IMPLANT
COVER SURGICAL LIGHT HANDLE (MISCELLANEOUS) ×12 IMPLANT
DECANTER SPIKE VIAL GLASS SM (MISCELLANEOUS) IMPLANT
DRAIN PENROSE 1/4X12 LTX STRL (WOUND CARE) IMPLANT
DRAPE C-ARM 42X72 X-RAY (DRAPES) ×4 IMPLANT
DRAPE C-ARMOR (DRAPES) ×4 IMPLANT
DRAPE HALF SHEET 40X57 (DRAPES) IMPLANT
DRAPE IMP U-DRAPE 54X76 (DRAPES) ×4 IMPLANT
DRAPE INCISE IOBAN 66X45 STRL (DRAPES) ×4 IMPLANT
DRAPE INCISE IOBAN 85X60 (DRAPES) ×4 IMPLANT
DRAPE OEC MINIVIEW 54X84 (DRAPES) ×4 IMPLANT
DRAPE ORTHO SPLIT 77X108 STRL (DRAPES) ×12
DRAPE SURG ORHT 6 SPLT 77X108 (DRAPES) ×9 IMPLANT
DRAPE U-SHAPE 47X51 STRL (DRAPES) ×8 IMPLANT
DRESSING MEPILEX FLEX 4X4 (GAUZE/BANDAGES/DRESSINGS) ×24 IMPLANT
DRILL CALIBRATED 4.3MMX365 (DRILL) ×4
DRILL CALIBRATED SHORT 4.9MM (BIT) ×4
DRILL CROWE POINT TWIST 4.5MM (DRILL) ×4
DRILL STEP 3.5 (DRILL)
DRSG ADAPTIC 3X8 NADH LF (GAUZE/BANDAGES/DRESSINGS) ×4 IMPLANT
DRSG EMULSION OIL 3X3 NADH (GAUZE/BANDAGES/DRESSINGS) ×4 IMPLANT
DRSG MEPILEX BORDER 4X12 (GAUZE/BANDAGES/DRESSINGS) IMPLANT
DRSG MEPILEX BORDER 4X4 (GAUZE/BANDAGES/DRESSINGS) ×4 IMPLANT
DRSG MEPILEX BORDER 4X8 (GAUZE/BANDAGES/DRESSINGS) ×4 IMPLANT
DRSG MEPILEX FLEX 4X4 (GAUZE/BANDAGES/DRESSINGS) ×32
DRSG MEPITEL 4X7.2 (GAUZE/BANDAGES/DRESSINGS) ×12 IMPLANT
DRSG PAD ABDOMINAL 8X10 ST (GAUZE/BANDAGES/DRESSINGS) ×16 IMPLANT
ELECT BLADE 6.5 EXT (BLADE) ×4 IMPLANT
ELECT REM PT RETURN 9FT ADLT (ELECTROSURGICAL) ×4
ELECTRODE REM PT RTRN 9FT ADLT (ELECTROSURGICAL) ×3 IMPLANT
EVACUATOR 1/8 PVC DRAIN (DRAIN) IMPLANT
EVACUATOR 3/16  PVC DRAIN (DRAIN)
EVACUATOR 3/16 PVC DRAIN (DRAIN) IMPLANT
GAUZE SPONGE 4X4 12PLY STRL (GAUZE/BANDAGES/DRESSINGS) ×8 IMPLANT
GAUZE SPONGE 4X4 12PLY STRL LF (GAUZE/BANDAGES/DRESSINGS) ×8 IMPLANT
GLOVE SRG 8 PF TXTR STRL LF DI (GLOVE) ×3 IMPLANT
GLOVE SURG ENC MOIS LTX SZ7.5 (GLOVE) ×4 IMPLANT
GLOVE SURG ENC MOIS LTX SZ8 (GLOVE) ×4 IMPLANT
GLOVE SURG UNDER POLY LF SZ7.5 (GLOVE) ×4 IMPLANT
GLOVE SURG UNDER POLY LF SZ8 (GLOVE) ×4
GOWN STRL REUS W/ TWL LRG LVL3 (GOWN DISPOSABLE) ×6 IMPLANT
GOWN STRL REUS W/ TWL XL LVL3 (GOWN DISPOSABLE) ×6 IMPLANT
GOWN STRL REUS W/TWL LRG LVL3 (GOWN DISPOSABLE) ×8
GOWN STRL REUS W/TWL XL LVL3 (GOWN DISPOSABLE) ×8
GUIDEPIN 3.2X17.5 THRD DISP (PIN) ×4 IMPLANT
GUIDEWIRE BALL NOSE 100CM (WIRE) ×4 IMPLANT
GUIDEWIRE BEAD TIP (WIRE) ×4 IMPLANT
HANDPIECE INTERPULSE COAX TIP (DISPOSABLE) ×4
K-WIRE 2.0 (WIRE) ×12
K-WIRE FXSTD 280X2XNS SS (WIRE) ×9
KIT BASIN OR (CUSTOM PROCEDURE TRAY) ×4 IMPLANT
KIT TURNOVER KIT B (KITS) ×4 IMPLANT
KWIRE FXSTD 280X2XNS SS (WIRE) ×9 IMPLANT
MANIFOLD NEPTUNE II (INSTRUMENTS) ×4 IMPLANT
NAIL FEM RETRO 10.5X380 (Nail) ×4 IMPLANT
NAIL FEMORAL IM ANTE 10X380 (Nail) ×4 IMPLANT
NEEDLE 22X1 1/2 (OR ONLY) (NEEDLE) IMPLANT
NEEDLE HYPO 25GX1X1/2 BEV (NEEDLE) IMPLANT
NEEDLE HYPO 25X1 1.5 SAFETY (NEEDLE) IMPLANT
NS IRRIG 1000ML POUR BTL (IV SOLUTION) ×4 IMPLANT
PACK GENERAL/GYN (CUSTOM PROCEDURE TRAY) ×4 IMPLANT
PACK ORTHO EXTREMITY (CUSTOM PROCEDURE TRAY) ×4 IMPLANT
PACK TOTAL JOINT (CUSTOM PROCEDURE TRAY) ×4 IMPLANT
PACK UNIVERSAL I (CUSTOM PROCEDURE TRAY) ×4 IMPLANT
PAD ABD 7.5X8 STRL (GAUZE/BANDAGES/DRESSINGS) ×8 IMPLANT
PAD ARMBOARD 7.5X6 YLW CONV (MISCELLANEOUS) ×8 IMPLANT
PAD CAST 3X4 CTTN HI CHSV (CAST SUPPLIES) ×3 IMPLANT
PAD CAST 4YDX4 CTTN HI CHSV (CAST SUPPLIES) ×3 IMPLANT
PADDING CAST COTTON 3X4 STRL (CAST SUPPLIES) ×4
PADDING CAST COTTON 4X4 STRL (CAST SUPPLIES) ×4
PADDING CAST COTTON 6X4 STRL (CAST SUPPLIES) ×4 IMPLANT
PIN GUIDE 3.0 THREADED (PIN) ×4 IMPLANT
PLATE FEM DIST NCB PP 278MM (Plate) ×4 IMPLANT
RETRIEVER SUT HEWSON (MISCELLANEOUS) ×4 IMPLANT
SCREW 5.0 80MM (Screw) ×8 IMPLANT
SCREW BONE 5.0X57.5MM CORTIC (Nail) ×4 IMPLANT
SCREW CANC FA 6X100 (Screw) ×4 IMPLANT
SCREW CANC FIXED ANN 6X115 (Screw) ×4 IMPLANT
SCREW CORT TI DBL LEAD 5X36 (Screw) ×4 IMPLANT
SCREW HEX HEAD 3.5X42.5 (Screw) ×4 IMPLANT
SCREW NCB 3.5X75X5X6.2XST (Screw) ×15 IMPLANT
SCREW NCB 4.0X36MM (Screw) ×4 IMPLANT
SCREW NCB 5.0X36MM (Screw) ×4 IMPLANT
SCREW NCB 5.0X38 (Screw) ×8 IMPLANT
SCREW NCB 5.0X75MM (Screw) ×20 IMPLANT
SET HNDPC FAN SPRY TIP SCT (DISPOSABLE) ×3 IMPLANT
SPONGE T-LAP 18X18 ~~LOC~~+RFID (SPONGE) ×8 IMPLANT
STAPLER VISISTAT 35W (STAPLE) ×4 IMPLANT
STOCKINETTE IMPERVIOUS 9X36 MD (GAUZE/BANDAGES/DRESSINGS) IMPLANT
STOCKINETTE IMPERVIOUS LG (DRAPES) IMPLANT
STRIP CLOSURE SKIN 1/2X4 (GAUZE/BANDAGES/DRESSINGS) ×4 IMPLANT
SUCTION FRAZIER HANDLE 10FR (MISCELLANEOUS) ×4
SUCTION TUBE FRAZIER 10FR DISP (MISCELLANEOUS) ×3 IMPLANT
SUT ETHILON 2 0 PSLX (SUTURE) ×8 IMPLANT
SUT ETHILON 3 0 PS 1 (SUTURE) ×8 IMPLANT
SUT FIBERWIRE #2 38 T-5 BLUE (SUTURE) ×8
SUT PROLENE 0 CT (SUTURE) IMPLANT
SUT PROLENE 0 CT 2 (SUTURE) IMPLANT
SUT VIC AB 0 CT1 27 (SUTURE) ×8
SUT VIC AB 0 CT1 27XBRD ANBCTR (SUTURE) ×6 IMPLANT
SUT VIC AB 1 CT1 18XCR BRD 8 (SUTURE) ×3 IMPLANT
SUT VIC AB 1 CT1 27 (SUTURE) ×8
SUT VIC AB 1 CT1 27XBRD ANBCTR (SUTURE) ×6 IMPLANT
SUT VIC AB 1 CT1 8-18 (SUTURE) ×4
SUT VIC AB 2-0 CT1 27 (SUTURE) ×8
SUT VIC AB 2-0 CT1 TAPERPNT 27 (SUTURE) ×6 IMPLANT
SUT VIC AB 2-0 CT3 27 (SUTURE) IMPLANT
SUTURE FIBERWR #2 38 T-5 BLUE (SUTURE) ×6 IMPLANT
SYR 20ML ECCENTRIC (SYRINGE) IMPLANT
SYR 5ML LL (SYRINGE) IMPLANT
SYR CONTROL 10ML LL (SYRINGE) IMPLANT
TOWEL GREEN STERILE (TOWEL DISPOSABLE) ×12 IMPLANT
TOWEL GREEN STERILE FF (TOWEL DISPOSABLE) ×8 IMPLANT
TRAY FOLEY MTR SLVR 16FR STAT (SET/KITS/TRAYS/PACK) IMPLANT
TUBE CONNECTING 12X1/4 (SUCTIONS) ×4 IMPLANT
UNDERPAD 30X36 HEAVY ABSORB (UNDERPADS AND DIAPERS) ×4 IMPLANT
WATER STERILE IRR 1000ML POUR (IV SOLUTION) ×8 IMPLANT
YANKAUER SUCT BULB TIP NO VENT (SUCTIONS) IMPLANT

## 2021-05-27 NOTE — Transfer of Care (Signed)
Immediate Anesthesia Transfer of Care Note  Patient: Melissa Harrington  Procedure(s) Performed: INTRAMEDULLARY (IM) NAIL FEMORAL (Left) OPEN REDUCTION INTERNAL FIXATION (ORIF) DISTAL FEMUR FRACTURE (Right)  Patient Location: ICU  Anesthesia Type:General  Level of Consciousness: sedated and Patient remains intubated per anesthesia plan  Airway & Oxygen Therapy: Patient remains intubated per anesthesia plan and Patient placed on Ventilator (see vital sign flow sheet for setting)  Post-op Assessment: Report given to RN and Post -op Vital signs reviewed and stable  Post vital signs: Reviewed and stable  Last Vitals:  Vitals Value Taken Time  BP 120/82116   Temp    Pulse 116   Resp 20   SpO2 100     Last Pain:  Vitals:   05/27/21 0800  TempSrc: Oral  PainSc:          Complications: No notable events documented.
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
## 2021-05-27 NOTE — OR Nursing (Signed)
Incorrect needle count. Radiologist Dr. Lavonia Dana confirmed, via inta-op x-ray, no retained needles were found in left operative leg, which was the area of concern.
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
## 2021-05-27 NOTE — Progress Notes (Signed)
ABG results called to Trauma MD Redmond Pulling, MD) with a critical value of Hb 5.8.

## 2021-05-27 NOTE — TOC CAGE-AID Note (Signed)
Transition of Care [facility]) - CAGE-AID Screening   Patient Details  Name: Melissa Harrington MRN: [id number] Date of Birth: 1971-09-27  Transition of Care [facility]) CM/SW Contact:    Keigen Caddell C Tarpley-Carter, [place] Phone Number: 05/27/2021, 8:29 AM   Clinical Narrative: Pt is unable to participate in Cage Aid.  Pt is intubated.   Millenia Waldvogel Tarpley-Carter, MSW, LCSW-A Pronouns:  She/Her/Hers                          [place] Clinical Social WorkerTransitions of Care Cell:  [phone] [email or web address]   CAGE-AID Screening: Substance Abuse Screening unable to be completed due to: : Patient unable to participate (Intubated)

## 2021-05-27 NOTE — Progress Notes (Signed)
Tyrone reviewed, looks better with less SDH and no mass effect

## 2021-05-27 NOTE — Addendum Note (Signed)
Addendum  created 05/27/21 0736 by Wilburn Cornelia, CRNA   Order list changed

## 2021-05-27 NOTE — Progress Notes (Signed)
Patient picked up from OR on ventilator. Patient transported to CT and then to 4N22 with no none complications. Vitals were stable during transport.
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
## 2021-05-28 ENCOUNTER — Inpatient Hospital Stay (HOSPITAL_COMMUNITY): Payer: 59

## 2021-05-28 LAB — CBC
HCT: 24.6 % — ABNORMAL LOW (ref 36.0–46.0)
Hemoglobin: 8.5 g/dL — ABNORMAL LOW (ref 12.0–15.0)
MCH: 31.3 pg (ref 26.0–34.0)
MCHC: 34.6 g/dL (ref 30.0–36.0)
MCV: 90.4 fL (ref 80.0–100.0)
Platelets: 78 10*3/uL — ABNORMAL LOW (ref 150–400)
RBC: 2.72 MIL/uL — ABNORMAL LOW (ref 3.87–5.11)
RDW: 15.9 % — ABNORMAL HIGH (ref 11.5–15.5)
WBC: 6.5 10*3/uL (ref 4.0–10.5)
nRBC: 0 % (ref 0.0–0.2)

## 2021-05-28 LAB — POCT I-STAT 7, (LYTES, BLD GAS, ICA,H+H)
Acid-Base Excess: 1 mmol/L (ref 0.0–2.0)
Bicarbonate: 25.7 mmol/L (ref 20.0–28.0)
Calcium, Ion: 1.18 mmol/L (ref 1.15–1.40)
HCT: 24 % — ABNORMAL LOW (ref 36.0–46.0)
Hemoglobin: 8.2 g/dL — ABNORMAL LOW (ref 12.0–15.0)
O2 Saturation: 98 %
Patient temperature: 98.3
Potassium: 3.3 mmol/L — ABNORMAL LOW (ref 3.5–5.1)
Sodium: 143 mmol/L (ref 135–145)
TCO2: 27 mmol/L (ref 22–32)
pCO2 arterial: 39 mmHg (ref 32.0–48.0)
pH, Arterial: 7.427 (ref 7.350–7.450)
pO2, Arterial: 99 mmHg (ref 83.0–108.0)

## 2021-05-28 LAB — MAGNESIUM
Magnesium: 1.5 mg/dL — ABNORMAL LOW (ref 1.7–2.4)
Magnesium: 1.6 mg/dL — ABNORMAL LOW (ref 1.7–2.4)

## 2021-05-28 LAB — BASIC METABOLIC PANEL
Anion gap: 7 (ref 5–15)
BUN: 5 mg/dL — ABNORMAL LOW (ref 6–20)
CO2: 25 mmol/L (ref 22–32)
Calcium: 7.8 mg/dL — ABNORMAL LOW (ref 8.9–10.3)
Chloride: 108 mmol/L (ref 98–111)
Creatinine, Ser: 0.81 mg/dL (ref 0.44–1.00)
GFR, Estimated: >60 mL/min (ref >60)
Glucose, Bld: 162 mg/dL — ABNORMAL HIGH (ref 70–99)
Potassium: 3.4 mmol/L — ABNORMAL LOW (ref 3.5–5.1)
Sodium: 140 mmol/L (ref 135–145)

## 2021-05-28 LAB — HEPARIN LEVEL (UNFRACTIONATED)
Heparin Unfractionated: <0.1 [IU]/mL — ABNORMAL LOW (ref 0.30–0.70)
Heparin Unfractionated: 0.1 [IU]/mL — ABNORMAL LOW (ref 0.30–0.70)
Heparin Unfractionated: 0.17 [IU]/mL — ABNORMAL LOW (ref 0.30–0.70)

## 2021-05-28 LAB — TRIGLYCERIDES: Triglycerides: 285 mg/dL — ABNORMAL HIGH (ref <150)

## 2021-05-28 LAB — PHOSPHORUS
Phosphorus: 2.1 mg/dL — ABNORMAL LOW (ref 2.5–4.6)
Phosphorus: 1.6 mg/dL — ABNORMAL LOW (ref 2.5–4.6)

## 2021-05-28 MED ORDER — PANTOPRAZOLE SODIUM 40 MG PO PACK
40.0000 mg | PACK | Freq: Every day | ORAL | Status: DC
  Administered 2021-05-28 – 2021-06-09 (×11): 40 mg
  Filled 2021-05-28 (×11): qty 20

## 2021-05-28 MED ORDER — DOCUSATE SODIUM 100 MG PO CAPS: 100.0000 mg | ORAL_CAPSULE | Freq: Two times a day (BID) | ORAL | Status: DC

## 2021-05-28 MED ORDER — POTASSIUM CHLORIDE 20 MEQ PO PACK
40.0000 meq | PACK | Freq: Once | ORAL | Status: AC
  Administered 2021-05-28: 40 meq
  Filled 2021-05-28: qty 2

## 2021-05-28 MED ORDER — PIVOT 1.5 CAL PO LIQD
1000.0000 mL | ORAL | Status: DC
  Administered 2021-05-28 (×2): 1000 mL

## 2021-05-28 MED ORDER — DOCUSATE SODIUM 50 MG/5ML PO LIQD
100.0000 mg | Freq: Two times a day (BID) | ORAL | Status: DC
  Administered 2021-05-28 – 2021-06-13 (×15): 100 mg
  Filled 2021-05-28 (×21): qty 10
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
## 2021-05-28 NOTE — Anesthesia Postprocedure Evaluation (Signed)
Anesthesia Post Note  Patient: Melissa Harrington  Procedure(s) Performed: INTRAMEDULLARY (IM) NAIL FEMORAL (Left) OPEN REDUCTION INTERNAL FIXATION (ORIF) DISTAL FEMUR FRACTURE (Right)     Patient location during evaluation: PACU Anesthesia Type: General Level of consciousness: awake and alert Pain management: pain level controlled Vital Signs Assessment: post-procedure vital signs reviewed and stable Respiratory status: spontaneous breathing, nonlabored ventilation, respiratory function stable and patient connected to nasal cannula oxygen Cardiovascular status: blood pressure returned to baseline and stable Postop Assessment: no apparent nausea or vomiting Anesthetic complications: no   No notable events documented.  Last Vitals:  Vitals:   05/28/21 0545 05/28/21 0600  BP:  136/81  Pulse: 81 93  Resp: 20 20  Temp:    SpO2: 98% 97%    Last Pain:  Vitals:   05/28/21 0400  TempSrc: Axillary  PainSc:                  Catalina Gravel
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
## 2021-05-29 ENCOUNTER — Encounter (HOSPITAL_COMMUNITY): Payer: Self-pay | Admitting: Certified Registered"

## 2021-05-29 ENCOUNTER — Inpatient Hospital Stay (HOSPITAL_COMMUNITY): Payer: 59

## 2021-05-29 ENCOUNTER — Inpatient Hospital Stay: Payer: Self-pay

## 2021-05-29 ENCOUNTER — Encounter (HOSPITAL_COMMUNITY): Payer: Self-pay | Admitting: Orthopedic Surgery

## 2021-05-29 DIAGNOSIS — I2693 Single subsegmental pulmonary embolism without acute cor pulmonale: Secondary | ICD-10-CM | POA: Diagnosis not present

## 2021-05-29 LAB — CBC
HCT: 21.6 % — ABNORMAL LOW (ref 36.0–46.0)
Hemoglobin: 7.2 g/dL — ABNORMAL LOW (ref 12.0–15.0)
MCH: 31.2 pg (ref 26.0–34.0)
MCHC: 33.3 g/dL (ref 30.0–36.0)
MCV: 93.5 fL (ref 80.0–100.0)
Platelets: 88 10*3/uL — ABNORMAL LOW (ref 150–400)
RBC: 2.31 MIL/uL — ABNORMAL LOW (ref 3.87–5.11)
RDW: 16.8 % — ABNORMAL HIGH (ref 11.5–15.5)
WBC: 4.6 10*3/uL (ref 4.0–10.5)
nRBC: 0.4 % — ABNORMAL HIGH (ref 0.0–0.2)

## 2021-05-29 LAB — TYPE AND SCREEN
ABO/RH(D): O POS
Antibody Screen: NEGATIVE
Unit division: 0
Unit division: 0
Unit division: 0
Unit division: 0
Unit division: 0
Unit division: 0
Unit division: 0
Unit division: 0
Unit division: 0
Unit division: 0
Unit division: 0
Unit division: 0
Unit division: 0
Unit division: 0
Unit division: 0

## 2021-05-29 LAB — POCT I-STAT 7, (LYTES, BLD GAS, ICA,H+H)
Acid-Base Excess: 1 mmol/L (ref 0.0–2.0)
Acid-Base Excess: 1 mmol/L (ref 0.0–2.0)
Acid-Base Excess: 1 mmol/L (ref 0.0–2.0)
Bicarbonate: 26.3 mmol/L (ref 20.0–28.0)
Bicarbonate: 25.8 mmol/L (ref 20.0–28.0)
Bicarbonate: 26.7 mmol/L (ref 20.0–28.0)
Calcium, Ion: 1.17 mmol/L (ref 1.15–1.40)
Calcium, Ion: 1.16 mmol/L (ref 1.15–1.40)
Calcium, Ion: 1.17 mmol/L (ref 1.15–1.40)
HCT: 21 % — ABNORMAL LOW (ref 36.0–46.0)
HCT: 21 % — ABNORMAL LOW (ref 36.0–46.0)
HCT: 20 % — ABNORMAL LOW (ref 36.0–46.0)
Hemoglobin: 7.1 g/dL — ABNORMAL LOW (ref 12.0–15.0)
Hemoglobin: 7.1 g/dL — ABNORMAL LOW (ref 12.0–15.0)
Hemoglobin: 6.8 g/dL — CL (ref 12.0–15.0)
O2 Saturation: 86 %
O2 Saturation: 85 %
O2 Saturation: 98 %
Patient temperature: 102.4
Patient temperature: 102.4
Patient temperature: 101
Potassium: 3.4 mmol/L — ABNORMAL LOW (ref 3.5–5.1)
Potassium: 3.2 mmol/L — ABNORMAL LOW (ref 3.5–5.1)
Potassium: 3.1 mmol/L — ABNORMAL LOW (ref 3.5–5.1)
Sodium: 143 mmol/L (ref 135–145)
Sodium: 142 mmol/L (ref 135–145)
Sodium: 142 mmol/L (ref 135–145)
TCO2: 28 mmol/L (ref 22–32)
TCO2: 27 mmol/L (ref 22–32)
TCO2: 28 mmol/L (ref 22–32)
pCO2 arterial: 47.4 mmHg (ref 32.0–48.0)
pCO2 arterial: 46.9 mmHg (ref 32.0–48.0)
pCO2 arterial: 51.2 mmHg — ABNORMAL HIGH (ref 32.0–48.0)
pH, Arterial: 7.361 (ref 7.350–7.450)
pH, Arterial: 7.357 (ref 7.350–7.450)
pH, Arterial: 7.332 — ABNORMAL LOW (ref 7.350–7.450)
pO2, Arterial: 60 mmHg — ABNORMAL LOW (ref 83.0–108.0)
pO2, Arterial: 59 mmHg — ABNORMAL LOW (ref 83.0–108.0)
pO2, Arterial: 120 mmHg — ABNORMAL HIGH (ref 83.0–108.0)

## 2021-05-29 LAB — BPAM RBC
Blood Product Expiration Date: 202207292359
Blood Product Expiration Date: 202208052359
Blood Product Expiration Date: 202208082359
Blood Product Expiration Date: 202208082359
Blood Product Expiration Date: 202208082359
Blood Product Expiration Date: 202208092359
Blood Product Expiration Date: 202208092359
Blood Product Expiration Date: 202208092359
Blood Product Expiration Date: 202208092359
Blood Product Expiration Date: 202208092359
Blood Product Expiration Date: 202208092359
Blood Product Expiration Date: 202208092359
Blood Product Expiration Date: 202208092359
Blood Product Expiration Date: 202208092359
Blood Product Expiration Date: 202208092359
ISSUE DATE / TIME: 202207091736
ISSUE DATE / TIME: 202207091738
ISSUE DATE / TIME: 202207091738
ISSUE DATE / TIME: 202207091929
ISSUE DATE / TIME: 202207091929
ISSUE DATE / TIME: 202207092121
ISSUE DATE / TIME: 202207092121
ISSUE DATE / TIME: 202207100823
ISSUE DATE / TIME: 202207101221
ISSUE DATE / TIME: 202207101545
ISSUE DATE / TIME: 202207101737
ISSUE DATE / TIME: 202207111559
ISSUE DATE / TIME: 202207111559
Unit Type and Rh: 5100
Unit Type and Rh: 5100
Unit Type and Rh: 5100
Unit Type and Rh: 5100
Unit Type and Rh: 5100
Unit Type and Rh: 5100
Unit Type and Rh: 5100
Unit Type and Rh: 5100
Unit Type and Rh: 5100
Unit Type and Rh: 5100
Unit Type and Rh: 5100
Unit Type and Rh: 5100
Unit Type and Rh: 5100
Unit Type and Rh: 5100
Unit Type and Rh: 5100

## 2021-05-29 LAB — BASIC METABOLIC PANEL
Anion gap: 4 — ABNORMAL LOW (ref 5–15)
BUN: 6 mg/dL (ref 6–20)
CO2: 27 mmol/L (ref 22–32)
Calcium: 7.6 mg/dL — ABNORMAL LOW (ref 8.9–10.3)
Chloride: 110 mmol/L (ref 98–111)
Creatinine, Ser: 0.74 mg/dL (ref 0.44–1.00)
GFR, Estimated: >60 mL/min (ref >60)
Glucose, Bld: 161 mg/dL — ABNORMAL HIGH (ref 70–99)
Potassium: 3.2 mmol/L — ABNORMAL LOW (ref 3.5–5.1)
Sodium: 141 mmol/L (ref 135–145)

## 2021-05-29 LAB — PREPARE RBC (CROSSMATCH)

## 2021-05-29 LAB — PHOSPHORUS
Phosphorus: 1.9 mg/dL — ABNORMAL LOW (ref 2.5–4.6)
Phosphorus: 3.4 mg/dL (ref 2.5–4.6)

## 2021-05-29 LAB — ECHOCARDIOGRAM COMPLETE
AR max vel: 1.34 cm2
AV Area VTI: 1.3 cm2
AV Area mean vel: 1.25 cm2
AV Mean grad: 29 mmHg
AV Peak grad: 53.9 mmHg
Ao pk vel: 3.67 m/s
Area-P 1/2: 5.75 cm2
Height: 68 in
S' Lateral: 2.2 cm
Weight: 2880 oz

## 2021-05-29 LAB — HEPARIN LEVEL (UNFRACTIONATED)
Heparin Unfractionated: 0.28 [IU]/mL — ABNORMAL LOW (ref 0.30–0.70)
Heparin Unfractionated: 0.38 [IU]/mL (ref 0.30–0.70)
Heparin Unfractionated: 0.36 [IU]/mL (ref 0.30–0.70)

## 2021-05-29 LAB — HEMOGLOBIN AND HEMATOCRIT, BLOOD
HCT: 24.9 % — ABNORMAL LOW (ref 36.0–46.0)
Hemoglobin: 8.1 g/dL — ABNORMAL LOW (ref 12.0–15.0)

## 2021-05-29 LAB — TRIGLYCERIDES: Triglycerides: 212 mg/dL — ABNORMAL HIGH (ref <150)

## 2021-05-29 LAB — MAGNESIUM
Magnesium: 1.7 mg/dL (ref 1.7–2.4)
Magnesium: 1.9 mg/dL (ref 1.7–2.4)

## 2021-05-29 MED ORDER — MIDAZOLAM 50MG/50ML (1MG/ML) PREMIX INFUSION
2.0000 mg/h | INTRAVENOUS | Status: DC
  Administered 2021-05-29: 2 mg/h via INTRAVENOUS
  Administered 2021-05-29: 10 mg/h via INTRAVENOUS
  Administered 2021-05-29: 5 mg/h via INTRAVENOUS
  Administered 2021-05-29: 10 mg/h via INTRAVENOUS
  Administered 2021-05-30 – 2021-06-02 (×3): 2 mg/h via INTRAVENOUS
  Administered 2021-06-03: 10 mg/h via INTRAVENOUS
  Filled 2021-05-29 (×9): qty 50

## 2021-05-29 MED ORDER — MIDAZOLAM HCL 2 MG/2ML IJ SOLN
2.0000 mg | INTRAMUSCULAR | Status: DC | PRN
  Administered 2021-05-29: 2 mg via INTRAVENOUS
  Filled 2021-05-29: qty 2

## 2021-05-29 MED ORDER — MIDAZOLAM BOLUS VIA INFUSION
1.0000 mg | INTRAVENOUS | Status: DC | PRN
  Administered 2021-05-29: 2 mg via INTRAVENOUS
  Filled 2021-05-29: qty 2

## 2021-05-29 MED ORDER — SPIRITUS FRUMENTI
1.0000 | Freq: Three times a day (TID) | ORAL | Status: DC
  Filled 2021-05-29 (×4): qty 1

## 2021-05-29 MED ORDER — METOPROLOL TARTRATE 5 MG/5ML IV SOLN
INTRAVENOUS | Status: AC
  Administered 2021-05-29: 5 mg via INTRAVENOUS
  Filled 2021-05-29: qty 5

## 2021-05-29 MED ORDER — SODIUM CHLORIDE 0.9% IV SOLUTION: Freq: Once | INTRAVENOUS | Status: DC

## 2021-05-29 MED ORDER — METOPROLOL TARTRATE 5 MG/5ML IV SOLN: 5.0000 mg | Freq: Once | INTRAVENOUS | Status: AC

## 2021-05-29 MED ORDER — ARTIFICIAL TEARS OPHTHALMIC OINT
1.0000 "application " | TOPICAL_OINTMENT | Freq: Three times a day (TID) | OPHTHALMIC | Status: DC
  Administered 2021-05-29 – 2021-06-03 (×15): 1 via OPHTHALMIC
  Filled 2021-05-29 (×3): qty 3.5

## 2021-05-29 MED ORDER — MAGNESIUM SULFATE 2 GM/50ML IV SOLN
2.0000 g | Freq: Once | INTRAVENOUS | Status: AC
  Administered 2021-05-29: 2 g via INTRAVENOUS
  Filled 2021-05-29: qty 50

## 2021-05-29 MED ORDER — VASOPRESSIN 20 UNITS/100 ML INFUSION FOR SHOCK
0.0000 [IU]/min | INTRAVENOUS | Status: DC
  Filled 2021-05-29: qty 100

## 2021-05-29 MED ORDER — PERFLUTREN LIPID MICROSPHERE
1.0000 mL | INTRAVENOUS | Status: AC | PRN
  Administered 2021-05-29: 3 mL via INTRAVENOUS
  Filled 2021-05-29: qty 10

## 2021-05-29 MED ORDER — IBUPROFEN 100 MG/5ML PO SUSP
800.0000 mg | Freq: Three times a day (TID) | ORAL | Status: DC
  Administered 2021-05-29: 800 mg
  Filled 2021-05-29 (×2): qty 40

## 2021-05-29 MED ORDER — VECURONIUM BOLUS VIA INFUSION
10.0000 mg | Freq: Once | INTRAVENOUS | Status: AC
  Administered 2021-05-29: 10 mg via INTRAVENOUS
  Filled 2021-05-29: qty 10

## 2021-05-29 MED ORDER — SPIRITUS FRUMENTI
1.0000 | Freq: Three times a day (TID) | ORAL | Status: DC
  Filled 2021-05-29: qty 1

## 2021-05-29 MED ORDER — POTASSIUM PHOSPHATES 15 MMOLE/5ML IV SOLN
30.0000 mmol | Freq: Once | INTRAVENOUS | Status: AC
  Administered 2021-05-29: 30 mmol via INTRAVENOUS
  Filled 2021-05-29: qty 10

## 2021-05-29 MED ORDER — VECURONIUM BROMIDE 10 MG IV SOLR
0.0000 ug/kg/min | Status: DC
  Administered 2021-05-29 – 2021-05-31 (×5): 1 ug/kg/min via INTRAVENOUS
  Filled 2021-05-29 (×4): qty 100
  Filled 2021-05-29: qty 80
  Filled 2021-05-29: qty 100

## 2021-05-29 MED ORDER — IBUPROFEN 200 MG PO TABS
800.0000 mg | ORAL_TABLET | Freq: Three times a day (TID) | ORAL | Status: DC
  Administered 2021-05-29 (×2): 800 mg
  Filled 2021-05-29 (×2): qty 4

## 2021-05-29 NOTE — Progress Notes (Addendum)
RT advanced ETT by 3 cm. ETT secured at 26. MD at bedside. Patient has BBS and return of VT.
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
## 2021-05-29 NOTE — Procedures (Signed)
   Procedure Note  Date: 05/29/2021  Procedure: central venous catheter placement--right, internal jugular vein, with ultrasound guidance  Pre-op diagnosis: inadequate IV access, unable to obtain additional peripheral access Post-op diagnosis: same  Surgeon: Jesusita Oka, MD  Anesthesia: local  EBL: <5cc Drains/Implants: 20 cm, triple lumen central venous catheter  Description of procedure: Time-out was performed verifying correct patient, procedure, site, laterality, and signature of informed consent. The right neck was prepped and draped in the usual sterile fashion. The right internal jugular vein was localized with ultrasound guidance. Five ccs of local anesthetic was infiltrated at the site of venous access. The right internal jugular vein was accessed using an introducer needle and a guidewire passed through the needle. The needle was removed and a skin nick was made. The tract was dilated and the central venous catheter advanced over the guidewire followed by removal of the guidewire. All ports drew blood easily and all were flushed with saline. The catheter was secured to the skin with suture and a sterile dressing. The patient tolerated the procedure well. There were no immediate complications. \Follow up chest x-ray was ordered to confirm positioning and the absence of a pneumothorax.   Jesusita Oka, MD General and [facility]
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
## 2021-05-29 NOTE — Progress Notes (Signed)
  Echocardiogram 2D Echocardiogram has been performed.  Melissa Harrington 05/29/2021, 10:16 AM

## 2021-05-29 NOTE — Progress Notes (Signed)
RT transported pt to and from CT without event. 
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
## 2021-05-29 NOTE — Progress Notes (Signed)
CT head reviewed, no change.
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
## 2021-05-30 ENCOUNTER — Encounter (HOSPITAL_COMMUNITY): Admission: EM | Disposition: A | Discharge status: Rehabilitation Facility | Payer: Self-pay | Source: Home / Self Care

## 2021-05-30 LAB — TYPE AND SCREEN
ABO/RH(D): O POS
Antibody Screen: NEGATIVE
Unit division: 0

## 2021-05-30 LAB — CBC
HCT: 25.9 % — ABNORMAL LOW (ref 36.0–46.0)
HCT: 25.2 % — ABNORMAL LOW (ref 36.0–46.0)
Hemoglobin: 8.6 g/dL — ABNORMAL LOW (ref 12.0–15.0)
Hemoglobin: 8 g/dL — ABNORMAL LOW (ref 12.0–15.0)
MCH: 31.2 pg (ref 26.0–34.0)
MCH: 30.7 pg (ref 26.0–34.0)
MCHC: 33.2 g/dL (ref 30.0–36.0)
MCHC: 31.7 g/dL (ref 30.0–36.0)
MCV: 93.8 fL (ref 80.0–100.0)
MCV: 96.6 fL (ref 80.0–100.0)
Platelets: 68 10*3/uL — ABNORMAL LOW (ref 150–400)
Platelets: 70 10*3/uL — ABNORMAL LOW (ref 150–400)
RBC: 2.76 MIL/uL — ABNORMAL LOW (ref 3.87–5.11)
RBC: 2.61 MIL/uL — ABNORMAL LOW (ref 3.87–5.11)
RDW: 18.7 % — ABNORMAL HIGH (ref 11.5–15.5)
RDW: 18.9 % — ABNORMAL HIGH (ref 11.5–15.5)
WBC: 12.5 10*3/uL — ABNORMAL HIGH (ref 4.0–10.5)
WBC: 9.3 10*3/uL (ref 4.0–10.5)
nRBC: 1.8 % — ABNORMAL HIGH (ref 0.0–0.2)
nRBC: 1 % — ABNORMAL HIGH (ref 0.0–0.2)

## 2021-05-30 LAB — BASIC METABOLIC PANEL
Anion gap: 8 (ref 5–15)
BUN: 10 mg/dL (ref 6–20)
CO2: 24 mmol/L (ref 22–32)
Calcium: 7.2 mg/dL — ABNORMAL LOW (ref 8.9–10.3)
Chloride: 110 mmol/L (ref 98–111)
Creatinine, Ser: 1 mg/dL (ref 0.44–1.00)
GFR, Estimated: >60 mL/min (ref >60)
Glucose, Bld: 143 mg/dL — ABNORMAL HIGH (ref 70–99)
Potassium: 3.6 mmol/L (ref 3.5–5.1)
Sodium: 142 mmol/L (ref 135–145)

## 2021-05-30 LAB — POCT I-STAT 7, (LYTES, BLD GAS, ICA,H+H)
Acid-base deficit: 1 mmol/L (ref 0.0–2.0)
Bicarbonate: 25.7 mmol/L (ref 20.0–28.0)
Calcium, Ion: 1.13 mmol/L — ABNORMAL LOW (ref 1.15–1.40)
HCT: 37 % (ref 36.0–46.0)
Hemoglobin: 12.6 g/dL (ref 12.0–15.0)
O2 Saturation: 97 %
Patient temperature: 98.8
Potassium: 3.5 mmol/L (ref 3.5–5.1)
Sodium: 142 mmol/L (ref 135–145)
TCO2: 27 mmol/L (ref 22–32)
pCO2 arterial: 52.7 mmHg — ABNORMAL HIGH (ref 32.0–48.0)
pH, Arterial: 7.296 — ABNORMAL LOW (ref 7.350–7.450)
pO2, Arterial: 101 mmHg (ref 83.0–108.0)

## 2021-05-30 LAB — BPAM RBC
Blood Product Expiration Date: 202208122359
ISSUE DATE / TIME: 202207131400
Unit Type and Rh: 5100

## 2021-05-30 LAB — APTT
aPTT: 50 s — ABNORMAL HIGH (ref 24–36)
aPTT: 56 s — ABNORMAL HIGH (ref 24–36)

## 2021-05-30 LAB — TRIGLYCERIDES: Triglycerides: 293 mg/dL — ABNORMAL HIGH (ref <150)

## 2021-05-30 LAB — HEPARIN LEVEL (UNFRACTIONATED): Heparin Unfractionated: 0.51 [IU]/mL (ref 0.30–0.70)

## 2021-05-30 LAB — GLUCOSE, CAPILLARY
Glucose-Capillary: 160 mg/dL — ABNORMAL HIGH (ref 70–99)
Glucose-Capillary: 154 mg/dL — ABNORMAL HIGH (ref 70–99)

## 2021-05-30 SURGERY — OPEN REDUCTION INTERNAL FIXATION (ORIF) ACETABULAR FRACTURE
Anesthesia: General | Site: Shoulder | Laterality: Right

## 2021-05-30 MED ORDER — SPIRITUS FRUMENTI
1.0000 | Freq: Three times a day (TID) | ORAL | Status: DC
  Filled 2021-05-30: qty 1

## 2021-05-30 MED ORDER — CHLORHEXIDINE GLUCONATE CLOTH 2 % EX PADS
6.0000 | MEDICATED_PAD | Freq: Every day | CUTANEOUS | Status: DC
  Administered 2021-05-31 – 2021-06-08 (×10): 6 via TOPICAL

## 2021-05-30 MED ORDER — SPIRITUS FRUMENTI
1.0000 | Freq: Three times a day (TID) | ORAL | Status: DC
  Administered 2021-05-30 – 2021-06-04 (×16): 1
  Filled 2021-05-30 (×17): qty 1

## 2021-05-30 MED ORDER — ARGATROBAN 50 MG/50ML IV SOLN
0.3000 ug/kg/min | INTRAVENOUS | Status: DC
  Administered 2021-05-30: 0.25 ug/kg/min via INTRAVENOUS
  Administered 2021-05-31: 0.3 ug/kg/min via INTRAVENOUS
  Filled 2021-05-30 (×3): qty 50

## 2021-05-30 MED ORDER — PIVOT 1.5 CAL PO LIQD
1000.0000 mL | ORAL | Status: DC
  Administered 2021-05-31 – 2021-06-03 (×4): 1000 mL
  Filled 2021-05-30 (×3): qty 1000
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
## 2021-05-30 NOTE — Progress Notes (Signed)
Patient ID: Melissa Harrington, female   DOB: [birth year], 50 y.o.   MRN: [id number] D/W Dr. Pascal Lux from IR. H efeels PE clots are too small for thrombectomy. He suggested repeat CTA if we think clot burden has worsened. Currently, she cannot lay flat for CTA due to respiratory failure. Continue argatroban.  Georganna Skeans, MD, MPH, FACS Please use [email or web address] to contact on call provider
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
## 2021-05-30 NOTE — Progress Notes (Signed)
Interventional Radiology Brief Note:  IR consulted for PE lysis in patient with nonocclusive lobar and segmental pulmonary embolic of right and left lobes based on recent CTA Chest 05/27/21.   Dr. Pascal Lux has discussed with Dr. Grandville Silos.  Clot burden too small for thrombectomy and patient with with recent polytrauma increasing risk for thrombolytics especially given her small volume, nonocclusive burden at present.  If clinical status changes, consider repeat CTA Chest to reassess.   Brynda Greathouse, MS RD PA-C

## 2021-05-31 LAB — POCT I-STAT 7, (LYTES, BLD GAS, ICA,H+H)
Acid-Base Excess: 5 mmol/L — ABNORMAL HIGH (ref 0.0–2.0)
Bicarbonate: 31.2 mmol/L — ABNORMAL HIGH (ref 20.0–28.0)
Calcium, Ion: 1.05 mmol/L — ABNORMAL LOW (ref 1.15–1.40)
HCT: 26 % — ABNORMAL LOW (ref 36.0–46.0)
Hemoglobin: 8.8 g/dL — ABNORMAL LOW (ref 12.0–15.0)
O2 Saturation: 99 %
Patient temperature: 99.1
Potassium: 3 mmol/L — ABNORMAL LOW (ref 3.5–5.1)
Sodium: 146 mmol/L — ABNORMAL HIGH (ref 135–145)
TCO2: 33 mmol/L — ABNORMAL HIGH (ref 22–32)
pCO2 arterial: 56.5 mmHg — ABNORMAL HIGH (ref 32.0–48.0)
pH, Arterial: 7.352 (ref 7.350–7.450)
pO2, Arterial: 148 mmHg — ABNORMAL HIGH (ref 83.0–108.0)

## 2021-05-31 LAB — GLUCOSE, CAPILLARY
Glucose-Capillary: 205 mg/dL — ABNORMAL HIGH (ref 70–99)
Glucose-Capillary: 161 mg/dL — ABNORMAL HIGH (ref 70–99)
Glucose-Capillary: 169 mg/dL — ABNORMAL HIGH (ref 70–99)
Glucose-Capillary: 180 mg/dL — ABNORMAL HIGH (ref 70–99)
Glucose-Capillary: 183 mg/dL — ABNORMAL HIGH (ref 70–99)
Glucose-Capillary: 189 mg/dL — ABNORMAL HIGH (ref 70–99)

## 2021-05-31 LAB — CBC
HCT: 24.9 % — ABNORMAL LOW (ref 36.0–46.0)
Hemoglobin: 8 g/dL — ABNORMAL LOW (ref 12.0–15.0)
MCH: 30.9 pg (ref 26.0–34.0)
MCHC: 32.1 g/dL (ref 30.0–36.0)
MCV: 96.1 fL (ref 80.0–100.0)
Platelets: 92 10*3/uL — ABNORMAL LOW (ref 150–400)
RBC: 2.59 MIL/uL — ABNORMAL LOW (ref 3.87–5.11)
RDW: 18.7 % — ABNORMAL HIGH (ref 11.5–15.5)
WBC: 9.2 10*3/uL (ref 4.0–10.5)
nRBC: 1.5 % — ABNORMAL HIGH (ref 0.0–0.2)

## 2021-05-31 LAB — TRIGLYCERIDES: Triglycerides: 901 mg/dL — ABNORMAL HIGH (ref <150)

## 2021-05-31 LAB — APTT: aPTT: 54 s — ABNORMAL HIGH (ref 24–36)

## 2021-05-31 MED ORDER — POTASSIUM CHLORIDE 20 MEQ PO PACK
40.0000 meq | PACK | Freq: Two times a day (BID) | ORAL | Status: AC
  Administered 2021-05-31 – 2021-06-01 (×3): 40 meq
  Filled 2021-05-31 (×3): qty 2

## 2021-05-31 MED ORDER — POTASSIUM CHLORIDE 20 MEQ PO PACK: 40.0000 meq | PACK | Freq: Two times a day (BID) | ORAL | Status: DC

## 2021-05-31 MED ORDER — INSULIN ASPART 100 UNIT/ML IJ SOLN
0.0000 [IU] | INTRAMUSCULAR | Status: DC
  Administered 2021-05-31 (×5): 3 [IU] via SUBCUTANEOUS
  Administered 2021-06-01 (×2): 5 [IU] via SUBCUTANEOUS

## 2021-05-31 MED ORDER — FUROSEMIDE 10 MG/ML IJ SOLN
40.0000 mg | Freq: Once | INTRAMUSCULAR | Status: AC
  Administered 2021-05-31: 40 mg via INTRAVENOUS
  Filled 2021-05-31: qty 4

## 2021-05-31 MED ORDER — SODIUM CHLORIDE 0.9 % IV SOLN
INTRAVENOUS | Status: DC | PRN
  Administered 2021-05-31 – 2021-06-02 (×2): 500 mL via INTRAVENOUS
  Administered 2021-06-14: 1000 mL via INTRAVENOUS

## 2021-05-31 MED ORDER — IPRATROPIUM-ALBUTEROL 0.5-2.5 (3) MG/3ML IN SOLN
3.0000 mL | Freq: Four times a day (QID) | RESPIRATORY_TRACT | Status: DC
  Administered 2021-05-31 – 2021-06-08 (×34): 3 mL via RESPIRATORY_TRACT
  Filled 2021-05-31 (×33): qty 3
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
## 2021-05-31 NOTE — Progress Notes (Signed)
RT NOTE: patient does not meet SBT criteria for this AM due to PEEP/FIO2 requirements.  Tolerating current ventilator settings well at this time.  RT will continue to monitor.
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
## 2021-05-31 NOTE — Progress Notes (Signed)
Patient ID: Melissa Harrington, female   DOB: 1971/10/03, 51 y.o.   MRN: [id number] I spoke with her daughters at the bedside.  Georganna Skeans, MD, MPH, FACS Please use [email or web address] to contact on call provider

## 2021-05-31 NOTE — Progress Notes (Signed)
Chaplain checked in with RN regarding family needs and made brief follow-up visit (after day 2) to daughters who were bedside.  Daughters recounted ongoing community support and acknowledged pt's progress.  Please contact if support is needed.  Luana Shu [phone]    05/31/21 1946  Clinical Encounter Type  Visited With Patient and family together  Visit Type Follow-up  Stress Factors  Patient Stress Factors Health changes  Family Stress Factors Loss;Major life changes

## 2021-06-01 ENCOUNTER — Inpatient Hospital Stay (HOSPITAL_COMMUNITY): Payer: 59

## 2021-06-01 DIAGNOSIS — J9601 Acute respiratory failure with hypoxia: Secondary | ICD-10-CM

## 2021-06-01 DIAGNOSIS — J9602 Acute respiratory failure with hypercapnia: Secondary | ICD-10-CM

## 2021-06-01 LAB — GLUCOSE, CAPILLARY
Glucose-Capillary: 218 mg/dL — ABNORMAL HIGH (ref 70–99)
Glucose-Capillary: 211 mg/dL — ABNORMAL HIGH (ref 70–99)
Glucose-Capillary: 166 mg/dL — ABNORMAL HIGH (ref 70–99)
Glucose-Capillary: 213 mg/dL — ABNORMAL HIGH (ref 70–99)
Glucose-Capillary: 215 mg/dL — ABNORMAL HIGH (ref 70–99)
Glucose-Capillary: 240 mg/dL — ABNORMAL HIGH (ref 70–99)

## 2021-06-01 LAB — CBC
HCT: 24.5 % — ABNORMAL LOW (ref 36.0–46.0)
Hemoglobin: 8 g/dL — ABNORMAL LOW (ref 12.0–15.0)
MCH: 31 pg (ref 26.0–34.0)
MCHC: 32.7 g/dL (ref 30.0–36.0)
MCV: 95 fL (ref 80.0–100.0)
Platelets: 127 10*3/uL — ABNORMAL LOW (ref 150–400)
RBC: 2.58 MIL/uL — ABNORMAL LOW (ref 3.87–5.11)
RDW: 18.7 % — ABNORMAL HIGH (ref 11.5–15.5)
WBC: 7.1 10*3/uL (ref 4.0–10.5)
nRBC: 1.3 % — ABNORMAL HIGH (ref 0.0–0.2)

## 2021-06-01 LAB — BASIC METABOLIC PANEL
Anion gap: 6 (ref 5–15)
BUN: 16 mg/dL (ref 6–20)
CO2: 32 mmol/L (ref 22–32)
Calcium: 7.3 mg/dL — ABNORMAL LOW (ref 8.9–10.3)
Chloride: 109 mmol/L (ref 98–111)
Creatinine, Ser: 0.67 mg/dL (ref 0.44–1.00)
GFR, Estimated: >60 mL/min (ref >60)
Glucose, Bld: 229 mg/dL — ABNORMAL HIGH (ref 70–99)
Potassium: 3.6 mmol/L (ref 3.5–5.1)
Sodium: 147 mmol/L — ABNORMAL HIGH (ref 135–145)

## 2021-06-01 LAB — POCT I-STAT 7, (LYTES, BLD GAS, ICA,H+H)
Acid-Base Excess: 11 mmol/L — ABNORMAL HIGH (ref 0.0–2.0)
Bicarbonate: 37.5 mmol/L — ABNORMAL HIGH (ref 20.0–28.0)
Calcium, Ion: 1.07 mmol/L — ABNORMAL LOW (ref 1.15–1.40)
HCT: 22 % — ABNORMAL LOW (ref 36.0–46.0)
Hemoglobin: 7.5 g/dL — ABNORMAL LOW (ref 12.0–15.0)
O2 Saturation: 93 %
Patient temperature: 102
Potassium: 3.7 mmol/L (ref 3.5–5.1)
Sodium: 149 mmol/L — ABNORMAL HIGH (ref 135–145)
TCO2: 39 mmol/L — ABNORMAL HIGH (ref 22–32)
pCO2 arterial: 69.7 mmHg (ref 32.0–48.0)
pH, Arterial: 7.348 — ABNORMAL LOW (ref 7.350–7.450)
pO2, Arterial: 82 mmHg — ABNORMAL LOW (ref 83.0–108.0)

## 2021-06-01 LAB — TRIGLYCERIDES: Triglycerides: 843 mg/dL — ABNORMAL HIGH (ref <150)

## 2021-06-01 LAB — CULTURE, RESPIRATORY W GRAM STAIN

## 2021-06-01 LAB — APTT
aPTT: 47 s — ABNORMAL HIGH (ref 24–36)
aPTT: 53 s — ABNORMAL HIGH (ref 24–36)
aPTT: 53 s — ABNORMAL HIGH (ref 24–36)

## 2021-06-01 MED ORDER — SODIUM CHLORIDE 0.9 % IV SOLN
2.0000 g | Freq: Three times a day (TID) | INTRAVENOUS | Status: AC
  Administered 2021-06-01 – 2021-06-08 (×21): 2 g via INTRAVENOUS
  Filled 2021-06-01 (×25): qty 2

## 2021-06-01 MED ORDER — FUROSEMIDE 10 MG/ML IJ SOLN
40.0000 mg | Freq: Two times a day (BID) | INTRAMUSCULAR | Status: DC
  Administered 2021-06-01 – 2021-06-03 (×4): 40 mg via INTRAVENOUS
  Filled 2021-06-01 (×4): qty 4

## 2021-06-01 MED ORDER — ARGATROBAN 50 MG/50ML IV SOLN
1.3000 ug/kg/min | INTRAVENOUS | Status: DC
  Administered 2021-06-01 – 2021-06-02 (×2): 0.36 ug/kg/min via INTRAVENOUS
  Administered 2021-06-03: 0.58 ug/kg/min via INTRAVENOUS
  Administered 2021-06-04: .82 ug/kg/min via INTRAVENOUS
  Administered 2021-06-04 – 2021-06-05 (×3): 0.82 ug/kg/min via INTRAVENOUS
  Administered 2021-06-06: 1 ug/kg/min via INTRAVENOUS
  Administered 2021-06-06: 0.9 ug/kg/min via INTRAVENOUS
  Administered 2021-06-06: 0.82 ug/kg/min via INTRAVENOUS
  Administered 2021-06-07 (×2): 1.3 ug/kg/min via INTRAVENOUS
  Administered 2021-06-07: 1.15 ug/kg/min via INTRAVENOUS
  Administered 2021-06-08 – 2021-06-09 (×4): 1.3 ug/kg/min via INTRAVENOUS
  Filled 2021-06-01 (×19): qty 50

## 2021-06-01 MED ORDER — INSULIN ASPART 100 UNIT/ML IJ SOLN
0.0000 [IU] | INTRAMUSCULAR | Status: DC
  Administered 2021-06-01: 7 [IU] via SUBCUTANEOUS
  Administered 2021-06-01: 4 [IU] via SUBCUTANEOUS
  Administered 2021-06-01 – 2021-06-02 (×7): 7 [IU] via SUBCUTANEOUS
  Administered 2021-06-03 (×3): 4 [IU] via SUBCUTANEOUS
  Administered 2021-06-03: 11 [IU] via SUBCUTANEOUS
  Administered 2021-06-03 – 2021-06-04 (×3): 7 [IU] via SUBCUTANEOUS
  Administered 2021-06-04 (×2): 4 [IU] via SUBCUTANEOUS
  Administered 2021-06-04: 3 [IU] via SUBCUTANEOUS
  Administered 2021-06-04 – 2021-06-05 (×2): 7 [IU] via SUBCUTANEOUS
  Administered 2021-06-05: 11 [IU] via SUBCUTANEOUS
  Administered 2021-06-05 (×4): 7 [IU] via SUBCUTANEOUS
  Administered 2021-06-06: 4 [IU] via SUBCUTANEOUS
  Administered 2021-06-06: 7 [IU] via SUBCUTANEOUS
  Administered 2021-06-07: 11 [IU] via SUBCUTANEOUS
  Administered 2021-06-07: 4 [IU] via SUBCUTANEOUS
  Administered 2021-06-07 (×2): 7 [IU] via SUBCUTANEOUS
  Administered 2021-06-07: 3 [IU] via SUBCUTANEOUS
  Administered 2021-06-07: 7 [IU] via SUBCUTANEOUS
  Administered 2021-06-07 – 2021-06-08 (×3): 11 [IU] via SUBCUTANEOUS
  Administered 2021-06-08: 4 [IU] via SUBCUTANEOUS
  Administered 2021-06-08: 11 [IU] via SUBCUTANEOUS
  Administered 2021-06-09 (×5): 4 [IU] via SUBCUTANEOUS
  Administered 2021-06-10: 7 [IU] via SUBCUTANEOUS
  Administered 2021-06-10 (×3): 4 [IU] via SUBCUTANEOUS
  Administered 2021-06-10 (×2): 7 [IU] via SUBCUTANEOUS
  Administered 2021-06-10: 4 [IU] via SUBCUTANEOUS
  Administered 2021-06-11: 3 [IU] via SUBCUTANEOUS
  Administered 2021-06-11 (×2): 7 [IU] via SUBCUTANEOUS
  Administered 2021-06-11: 11 [IU] via SUBCUTANEOUS
  Administered 2021-06-11: 7 [IU] via SUBCUTANEOUS
  Administered 2021-06-12: 3 [IU] via SUBCUTANEOUS
  Administered 2021-06-12: 11 [IU] via SUBCUTANEOUS
  Administered 2021-06-12: 7 [IU] via SUBCUTANEOUS
  Administered 2021-06-12: 3 [IU] via SUBCUTANEOUS
  Administered 2021-06-12 (×3): 4 [IU] via SUBCUTANEOUS
  Administered 2021-06-13: 3 [IU] via SUBCUTANEOUS
  Administered 2021-06-13: 4 [IU] via SUBCUTANEOUS
  Administered 2021-06-13: 3 [IU] via SUBCUTANEOUS
  Administered 2021-06-13 (×3): 4 [IU] via SUBCUTANEOUS
  Administered 2021-06-14 (×2): 3 [IU] via SUBCUTANEOUS
  Administered 2021-06-14: 4 [IU] via SUBCUTANEOUS
  Administered 2021-06-14: 7 [IU] via SUBCUTANEOUS
  Administered 2021-06-14: 4 [IU] via SUBCUTANEOUS
  Administered 2021-06-15: 3 [IU] via SUBCUTANEOUS
  Administered 2021-06-15 (×2): 4 [IU] via SUBCUTANEOUS
  Administered 2021-06-15: 3 [IU] via SUBCUTANEOUS
  Administered 2021-06-15: 4 [IU] via SUBCUTANEOUS
  Administered 2021-06-16: 3 [IU] via SUBCUTANEOUS

## 2021-06-01 MED ORDER — SODIUM CHLORIDE 0.9 % IV SOLN: INTRAVENOUS | Status: DC | PRN

## 2021-06-01 NOTE — Progress Notes (Signed)
Temp 102.3.  Scheduled tylenol given and ice packs applied.
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
## 2021-06-01 NOTE — Progress Notes (Signed)
Cooling blanket applied.

## 2021-06-01 NOTE — Progress Notes (Signed)
New left femoral a-line placed by Dr. Lynetta Mare at the bedside.  aPTT level drawn.

## 2021-06-01 NOTE — Progress Notes (Signed)
RT NOTES: CPT performed with bed positioned to the right
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
## 2021-06-01 NOTE — Procedures (Signed)
Arterial Catheter Insertion Procedure Note  BRITLEY GASHI  [id number]  [birth year]  Date:06/01/21  Time:11:16 AM    Provider Performing: Kipp Brood    Procedure: Insertion of Arterial Line [phone]) with US guidance (21308)   Indication(s) Blood pressure monitoring and/or need for frequent ABGs  Consent Unable to obtain consent due to emergent nature of procedure.  Anesthesia On continuous sedation   Time Out Verified patient identification, verified procedure, site/side was marked, verified correct patient position, special equipment/implants available, medications/allergies/relevant history reviewed, required imaging and test results available.   Sterile Technique Maximal sterile technique including full sterile barrier drape, hand hygiene, sterile gown, sterile gloves, mask, hair covering, sterile ultrasound probe cover (if used).   Procedure Description Area of catheter insertion was cleaned with chlorhexidine and draped in sterile fashion. With real-time ultrasound guidance an arterial catheter was placed into the left femoral artery.  Appropriate arterial tracings confirmed on monitor.     Complications/Tolerance None; patient tolerated the procedure well.   EBL Minimal  Kipp Brood, MD [facility] ICU Physician [place]  Pager: [phone] Or Epic Secure Chat After hours: [phone].  06/01/2021, 11:17 AM

## 2021-06-01 NOTE — Progress Notes (Signed)
Temp still high.  102.4 via esophageal temp probe.  New ice packs applied, personal fan on high.
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
## 2021-06-02 ENCOUNTER — Inpatient Hospital Stay (HOSPITAL_COMMUNITY): Payer: 59

## 2021-06-02 LAB — BASIC METABOLIC PANEL
Anion gap: 6 (ref 5–15)
BUN: 23 mg/dL — ABNORMAL HIGH (ref 6–20)
CO2: 37 mmol/L — ABNORMAL HIGH (ref 22–32)
Calcium: 7.7 mg/dL — ABNORMAL LOW (ref 8.9–10.3)
Chloride: 107 mmol/L (ref 98–111)
Creatinine, Ser: 0.72 mg/dL (ref 0.44–1.00)
GFR, Estimated: >60 mL/min (ref >60)
Glucose, Bld: 274 mg/dL — ABNORMAL HIGH (ref 70–99)
Potassium: 3.4 mmol/L — ABNORMAL LOW (ref 3.5–5.1)
Sodium: 150 mmol/L — ABNORMAL HIGH (ref 135–145)

## 2021-06-02 LAB — GLUCOSE, CAPILLARY
Glucose-Capillary: 231 mg/dL — ABNORMAL HIGH (ref 70–99)
Glucose-Capillary: 223 mg/dL — ABNORMAL HIGH (ref 70–99)
Glucose-Capillary: 217 mg/dL — ABNORMAL HIGH (ref 70–99)
Glucose-Capillary: 225 mg/dL — ABNORMAL HIGH (ref 70–99)
Glucose-Capillary: 243 mg/dL — ABNORMAL HIGH (ref 70–99)
Glucose-Capillary: 256 mg/dL — ABNORMAL HIGH (ref 70–99)

## 2021-06-02 LAB — CBC
HCT: 23 % — ABNORMAL LOW (ref 36.0–46.0)
Hemoglobin: 7.2 g/dL — ABNORMAL LOW (ref 12.0–15.0)
MCH: 30 pg (ref 26.0–34.0)
MCHC: 31.3 g/dL (ref 30.0–36.0)
MCV: 95.8 fL (ref 80.0–100.0)
Platelets: 156 10*3/uL (ref 150–400)
RBC: 2.4 MIL/uL — ABNORMAL LOW (ref 3.87–5.11)
RDW: 18.6 % — ABNORMAL HIGH (ref 11.5–15.5)
WBC: 5 10*3/uL (ref 4.0–10.5)
nRBC: 2 % — ABNORMAL HIGH (ref 0.0–0.2)

## 2021-06-02 LAB — TRIGLYCERIDES: Triglycerides: 444 mg/dL — ABNORMAL HIGH (ref <150)

## 2021-06-02 LAB — APTT: aPTT: 52 s — ABNORMAL HIGH (ref 24–36)

## 2021-06-02 MED ORDER — FREE WATER
200.0000 mL | Status: DC
  Administered 2021-06-02 – 2021-06-05 (×13): 200 mL

## 2021-06-02 MED ORDER — INSULIN DETEMIR 100 UNIT/ML ~~LOC~~ SOLN
10.0000 [IU] | Freq: Two times a day (BID) | SUBCUTANEOUS | Status: DC
  Administered 2021-06-02 (×2): 10 [IU] via SUBCUTANEOUS
  Filled 2021-06-02 (×4): qty 0.1

## 2021-06-02 MED ORDER — POTASSIUM CHLORIDE 10 MEQ/100ML IV SOLN
10.0000 meq | INTRAVENOUS | Status: AC
  Administered 2021-06-02 (×4): 10 meq via INTRAVENOUS
  Filled 2021-06-02 (×4): qty 100
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
## 2021-06-03 ENCOUNTER — Inpatient Hospital Stay (HOSPITAL_COMMUNITY): Payer: 59

## 2021-06-03 LAB — BASIC METABOLIC PANEL
Anion gap: 10 (ref 5–15)
BUN: 28 mg/dL — ABNORMAL HIGH (ref 6–20)
CO2: 32 mmol/L (ref 22–32)
Calcium: 8.2 mg/dL — ABNORMAL LOW (ref 8.9–10.3)
Chloride: 108 mmol/L (ref 98–111)
Creatinine, Ser: 0.73 mg/dL (ref 0.44–1.00)
GFR, Estimated: >60 mL/min (ref >60)
Glucose, Bld: 227 mg/dL — ABNORMAL HIGH (ref 70–99)
Potassium: 3.4 mmol/L — ABNORMAL LOW (ref 3.5–5.1)
Sodium: 150 mmol/L — ABNORMAL HIGH (ref 135–145)

## 2021-06-03 LAB — GLUCOSE, CAPILLARY
Glucose-Capillary: 187 mg/dL — ABNORMAL HIGH (ref 70–99)
Glucose-Capillary: 190 mg/dL — ABNORMAL HIGH (ref 70–99)
Glucose-Capillary: 180 mg/dL — ABNORMAL HIGH (ref 70–99)
Glucose-Capillary: 218 mg/dL — ABNORMAL HIGH (ref 70–99)
Glucose-Capillary: 213 mg/dL — ABNORMAL HIGH (ref 70–99)

## 2021-06-03 LAB — APTT
aPTT: 46 s — ABNORMAL HIGH (ref 24–36)
aPTT: 41 s — ABNORMAL HIGH (ref 24–36)
aPTT: 37 s — ABNORMAL HIGH (ref 24–36)

## 2021-06-03 LAB — CBC
HCT: 23 % — ABNORMAL LOW (ref 36.0–46.0)
Hemoglobin: 7.1 g/dL — ABNORMAL LOW (ref 12.0–15.0)
MCH: 29.5 pg (ref 26.0–34.0)
MCHC: 30.9 g/dL (ref 30.0–36.0)
MCV: 95.4 fL (ref 80.0–100.0)
Platelets: 216 10*3/uL (ref 150–400)
RBC: 2.41 MIL/uL — ABNORMAL LOW (ref 3.87–5.11)
RDW: 19 % — ABNORMAL HIGH (ref 11.5–15.5)
WBC: 8.3 10*3/uL (ref 4.0–10.5)
nRBC: 1.6 % — ABNORMAL HIGH (ref 0.0–0.2)

## 2021-06-03 LAB — TRIGLYCERIDES: Triglycerides: 356 mg/dL — ABNORMAL HIGH (ref <150)

## 2021-06-03 LAB — CDS SEROLOGY

## 2021-06-03 MED ORDER — PIVOT 1.5 CAL PO LIQD
1000.0000 mL | ORAL | Status: DC
  Administered 2021-06-04 – 2021-06-12 (×10): 1000 mL
  Filled 2021-06-03 (×3): qty 1000

## 2021-06-03 MED ORDER — FUROSEMIDE 10 MG/ML IJ SOLN
60.0000 mg | Freq: Two times a day (BID) | INTRAMUSCULAR | Status: AC
  Administered 2021-06-03 – 2021-06-04 (×2): 60 mg via INTRAVENOUS
  Filled 2021-06-03 (×2): qty 6

## 2021-06-03 MED ORDER — POTASSIUM CHLORIDE 20 MEQ PO PACK: 40.0000 meq | PACK | Freq: Once | ORAL | Status: DC

## 2021-06-03 MED ORDER — PIVOT 1.5 CAL PO LIQD: 1000.0000 mL | ORAL | Status: DC

## 2021-06-03 MED ORDER — FENTANYL 2500MCG IN NS 250ML (10MCG/ML) PREMIX INFUSION
0.0000 ug/h | INTRAVENOUS | Status: DC
  Administered 2021-06-03: 300 ug/h via INTRAVENOUS
  Administered 2021-06-03: 400 ug/h via INTRAVENOUS
  Administered 2021-06-03: 200 ug/h via INTRAVENOUS
  Administered 2021-06-03 – 2021-06-04 (×3): 400 ug/h via INTRAVENOUS
  Administered 2021-06-04: 300 ug/h via INTRAVENOUS
  Administered 2021-06-04 – 2021-06-06 (×5): 400 ug/h via INTRAVENOUS
  Filled 2021-06-03 (×11): qty 250

## 2021-06-03 MED ORDER — POTASSIUM CHLORIDE 20 MEQ PO PACK
40.0000 meq | PACK | ORAL | Status: AC
  Administered 2021-06-03 (×2): 40 meq
  Filled 2021-06-03 (×2): qty 2

## 2021-06-03 MED ORDER — INSULIN DETEMIR 100 UNIT/ML ~~LOC~~ SOLN
15.0000 [IU] | Freq: Two times a day (BID) | SUBCUTANEOUS | Status: DC
  Administered 2021-06-03 – 2021-06-04 (×3): 15 [IU] via SUBCUTANEOUS
  Filled 2021-06-03 (×6): qty 0.15

## 2021-06-03 NOTE — Progress Notes (Signed)
Patient esophageal temp 101.1 despite cooling blanket and scheduled tylenol. Putting ice packs on patient now. Patients temp now 100.3. Will continue to monitor.
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
## 2021-06-03 NOTE — Progress Notes (Signed)
Prepared to perform I&O on patient; after lowering the [facility] the patient began to void into the suction canister. Will continue to monitor urine output and I&O if necessary.
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
## 2021-06-03 NOTE — Progress Notes (Deleted)
Since pulling out foley this AM patient has not voided. Bladder scan showed 450 cc. Will I&O patient.
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
## 2021-06-03 NOTE — Progress Notes (Signed)
I witnessed Dr. Marcelino Scot explain the surgical procedure to both daughters at bedside at this time. Procedure planned for tomorrow morning. All questions answered. Surgical consent placed in patient's paper chart at this time.  Montez Hageman, RN
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
## 2021-06-03 NOTE — Progress Notes (Signed)
[facility] Progress Note Patient Name: Melissa Harrington DOB: [birth year] MRN: [id number]   Date of Service  06/03/2021  HPI/Events of Note  Ventilator asynchrony - Patient stacking breaths on ventilator.   eICU Interventions  Plan: Portable CXR STAT. Increase ceiling on Fentanyl IV infusion to 400 mcg/hour.      Intervention Category Major Interventions: Other:;Respiratory failure - evaluation and management  Zubayr Bednarczyk Cornelia Copa 06/03/2021, 4:17 AM

## 2021-06-04 ENCOUNTER — Inpatient Hospital Stay (HOSPITAL_COMMUNITY): Payer: 59 | Admitting: Anesthesiology

## 2021-06-04 ENCOUNTER — Inpatient Hospital Stay (HOSPITAL_COMMUNITY): Payer: 59

## 2021-06-04 ENCOUNTER — Encounter (HOSPITAL_COMMUNITY): Admission: EM | Disposition: A | Discharge status: Rehabilitation Facility | Payer: Self-pay | Source: Home / Self Care

## 2021-06-04 HISTORY — PX: ORIF ACETABULAR FRACTURE: SHX5029

## 2021-06-04 HISTORY — PX: ORIF RADIAL FRACTURE: SHX5113

## 2021-06-04 HISTORY — PX: ORIF HUMERUS FRACTURE: SHX2126

## 2021-06-04 LAB — APTT
aPTT: 40 s — ABNORMAL HIGH (ref 24–36)
aPTT: 45 s — ABNORMAL HIGH (ref 24–36)
aPTT: 56 s — ABNORMAL HIGH (ref 24–36)

## 2021-06-04 LAB — POCT I-STAT 7, (LYTES, BLD GAS, ICA,H+H)
Acid-Base Excess: 7 mmol/L — ABNORMAL HIGH (ref 0.0–2.0)
Acid-Base Excess: 6 mmol/L — ABNORMAL HIGH (ref 0.0–2.0)
Bicarbonate: 32.3 mmol/L — ABNORMAL HIGH (ref 20.0–28.0)
Bicarbonate: 31.4 mmol/L — ABNORMAL HIGH (ref 20.0–28.0)
Calcium, Ion: 1.18 mmol/L (ref 1.15–1.40)
Calcium, Ion: 1.14 mmol/L — ABNORMAL LOW (ref 1.15–1.40)
HCT: 21 % — ABNORMAL LOW (ref 36.0–46.0)
HCT: 25 % — ABNORMAL LOW (ref 36.0–46.0)
Hemoglobin: 7.1 g/dL — ABNORMAL LOW (ref 12.0–15.0)
Hemoglobin: 8.5 g/dL — ABNORMAL LOW (ref 12.0–15.0)
O2 Saturation: 95 %
O2 Saturation: 91 %
Patient temperature: 37.1
Patient temperature: 37.3
Potassium: 3.7 mmol/L (ref 3.5–5.1)
Potassium: 4 mmol/L (ref 3.5–5.1)
Sodium: 154 mmol/L — ABNORMAL HIGH (ref 135–145)
Sodium: 153 mmol/L — ABNORMAL HIGH (ref 135–145)
TCO2: 34 mmol/L — ABNORMAL HIGH (ref 22–32)
TCO2: 33 mmol/L — ABNORMAL HIGH (ref 22–32)
pCO2 arterial: 51.7 mmHg — ABNORMAL HIGH (ref 32.0–48.0)
pCO2 arterial: 52.9 mmHg — ABNORMAL HIGH (ref 32.0–48.0)
pH, Arterial: 7.404 (ref 7.350–7.450)
pH, Arterial: 7.382 (ref 7.350–7.450)
pO2, Arterial: 79 mmHg — ABNORMAL LOW (ref 83.0–108.0)
pO2, Arterial: 65 mmHg — ABNORMAL LOW (ref 83.0–108.0)

## 2021-06-04 LAB — BASIC METABOLIC PANEL
Anion gap: 8 (ref 5–15)
BUN: 29 mg/dL — ABNORMAL HIGH (ref 6–20)
CO2: 32 mmol/L (ref 22–32)
Calcium: 8.3 mg/dL — ABNORMAL LOW (ref 8.9–10.3)
Chloride: 111 mmol/L (ref 98–111)
Creatinine, Ser: 0.69 mg/dL (ref 0.44–1.00)
GFR, Estimated: >60 mL/min (ref >60)
Glucose, Bld: 177 mg/dL — ABNORMAL HIGH (ref 70–99)
Potassium: 3.6 mmol/L (ref 3.5–5.1)
Sodium: 151 mmol/L — ABNORMAL HIGH (ref 135–145)

## 2021-06-04 LAB — GLUCOSE, CAPILLARY
Glucose-Capillary: 139 mg/dL — ABNORMAL HIGH (ref 70–99)
Glucose-Capillary: 162 mg/dL — ABNORMAL HIGH (ref 70–99)
Glucose-Capillary: 199 mg/dL — ABNORMAL HIGH (ref 70–99)
Glucose-Capillary: 217 mg/dL — ABNORMAL HIGH (ref 70–99)
Glucose-Capillary: 221 mg/dL — ABNORMAL HIGH (ref 70–99)

## 2021-06-04 LAB — CBC
HCT: 25 % — ABNORMAL LOW (ref 36.0–46.0)
Hemoglobin: 7.5 g/dL — ABNORMAL LOW (ref 12.0–15.0)
MCH: 29.8 pg (ref 26.0–34.0)
MCHC: 30 g/dL (ref 30.0–36.0)
MCV: 99.2 fL (ref 80.0–100.0)
Platelets: 266 10*3/uL (ref 150–400)
RBC: 2.52 MIL/uL — ABNORMAL LOW (ref 3.87–5.11)
RDW: 19.8 % — ABNORMAL HIGH (ref 11.5–15.5)
WBC: 7.9 10*3/uL (ref 4.0–10.5)
nRBC: 1.3 % — ABNORMAL HIGH (ref 0.0–0.2)

## 2021-06-04 LAB — PREPARE RBC (CROSSMATCH)

## 2021-06-04 SURGERY — OPEN REDUCTION INTERNAL FIXATION (ORIF) ACETABULAR FRACTURE
Anesthesia: General | Site: Hip | Laterality: Right

## 2021-06-04 MED ORDER — FENTANYL CITRATE (PF) 250 MCG/5ML IJ SOLN
INTRAMUSCULAR | Status: AC
  Filled 2021-06-04: qty 5

## 2021-06-04 MED ORDER — PROPOFOL 10 MG/ML IV BOLUS
INTRAVENOUS | Status: AC
  Filled 2021-06-04: qty 20

## 2021-06-04 MED ORDER — ALBUMIN HUMAN 5 % IV SOLN: INTRAVENOUS | Status: DC | PRN

## 2021-06-04 MED ORDER — ROCURONIUM BROMIDE 10 MG/ML (PF) SYRINGE
PREFILLED_SYRINGE | INTRAVENOUS | Status: AC
  Filled 2021-06-04: qty 20

## 2021-06-04 MED ORDER — ROCURONIUM BROMIDE 10 MG/ML (PF) SYRINGE
PREFILLED_SYRINGE | INTRAVENOUS | Status: DC | PRN
  Administered 2021-06-04: 20 mg via INTRAVENOUS
  Administered 2021-06-04: 50 mg via INTRAVENOUS
  Administered 2021-06-04: 80 mg via INTRAVENOUS
  Administered 2021-06-04 (×2): 100 mg via INTRAVENOUS
  Administered 2021-06-04: 50 mg via INTRAVENOUS
  Administered 2021-06-04: 100 mg via INTRAVENOUS

## 2021-06-04 MED ORDER — SODIUM CHLORIDE 0.9 % IV SOLN: Freq: Three times a day (TID) | INTRAVENOUS | Status: DC

## 2021-06-04 MED ORDER — MIDAZOLAM HCL 2 MG/2ML IJ SOLN
INTRAMUSCULAR | Status: DC | PRN
  Administered 2021-06-04: 4 mg via INTRAVENOUS

## 2021-06-04 MED ORDER — SODIUM CHLORIDE 0.9 % IV SOLN: 10.0000 mL/h | Freq: Once | INTRAVENOUS | Status: AC

## 2021-06-04 MED ORDER — BUPIVACAINE HCL (PF) 0.25 % IJ SOLN
INTRAMUSCULAR | Status: AC
  Filled 2021-06-04: qty 30

## 2021-06-04 MED ORDER — SODIUM CHLORIDE 0.9 % IR SOLN
Status: DC | PRN
  Administered 2021-06-04: 3000 mL

## 2021-06-04 MED ORDER — LACTATED RINGERS IV SOLN: INTRAVENOUS | Status: DC | PRN

## 2021-06-04 MED ORDER — 0.9 % SODIUM CHLORIDE (POUR BTL) OPTIME
TOPICAL | Status: DC | PRN
  Administered 2021-06-04: 3000 mL

## 2021-06-04 MED ORDER — MIDAZOLAM HCL 2 MG/2ML IJ SOLN
INTRAMUSCULAR | Status: AC
  Filled 2021-06-04: qty 4

## 2021-06-04 MED ORDER — ROCURONIUM BROMIDE 10 MG/ML (PF) SYRINGE
PREFILLED_SYRINGE | INTRAVENOUS | Status: AC
  Filled 2021-06-04: qty 30

## 2021-06-04 SURGICAL SUPPLY — 168 items
BAG COUNTER SPONGE SURGICOUNT (BAG) ×4 IMPLANT
BAG SPNG CNTER NS LX DISP (BAG) ×3
BIT DRILL 2.5X110 QC LCP DISP (BIT) ×1 IMPLANT
BIT DRILL 2.8 (BIT) ×3
BIT DRILL 3.5 LONG (BIT) ×1 IMPLANT
BIT DRILL AO MATTA 2.5MX230M (BIT) IMPLANT
BIT DRILL CANN QC 2.8X165 (BIT) IMPLANT
BIT DRILL DRAD QC 1.8 (DRILL) IMPLANT
BIT DRILL EVOS SHORT 2.0 (BIT) ×4
BIT DRILL QC 2.5MM SHRT EVO SM (DRILL) IMPLANT
BIT DRILL QC 3.5X110 (BIT) ×1 IMPLANT
BIT DRILL QC EVOS SHORT 1.8 (DRILL) IMPLANT
BIT DRILL SRG SHRT 2XAO CNCT (BIT) IMPLANT
BIT DRILL STEP 3.5 (DRILL) IMPLANT
BIT DRILL SURG QC EVOS 2.0 (DRILL) IMPLANT
BIT DRL SRG SHRT 2XAO QCK CNCT (BIT) ×3
BLADE AVERAGE 25X9 (BLADE) IMPLANT
BLADE CLIPPER SURG (BLADE) IMPLANT
BNDG CMPR 9X4 STRL LF SNTH (GAUZE/BANDAGES/DRESSINGS)
BNDG COHESIVE 4X5 TAN STRL (GAUZE/BANDAGES/DRESSINGS) ×4 IMPLANT
BNDG ELASTIC 3X5.8 VLCR STR LF (GAUZE/BANDAGES/DRESSINGS) ×1 IMPLANT
BNDG ELASTIC 4X5.8 VLCR STR LF (GAUZE/BANDAGES/DRESSINGS) ×2 IMPLANT
BNDG ELASTIC 6X5.8 VLCR STR LF (GAUZE/BANDAGES/DRESSINGS) ×1 IMPLANT
BNDG ESMARK 4X9 LF (GAUZE/BANDAGES/DRESSINGS) IMPLANT
BNDG GAUZE ELAST 4 BULKY (GAUZE/BANDAGES/DRESSINGS) ×7 IMPLANT
BONE CANC CHIPS 20CC PCAN1/4 (Bone Implant) ×4 IMPLANT
BRUSH SCRUB EZ PLAIN DRY (MISCELLANEOUS) ×8 IMPLANT
CHIPS CANC BONE 20CC PCAN1/4 (Bone Implant) ×3 IMPLANT
CORD BIPOLAR FORCEPS 12FT (ELECTRODE) IMPLANT
COVER SURGICAL LIGHT HANDLE (MISCELLANEOUS) ×4 IMPLANT
DRAIN PENROSE 1/4X12 LTX STRL (WOUND CARE) IMPLANT
DRAPE C-ARM 42X72 X-RAY (DRAPES) ×5 IMPLANT
DRAPE C-ARMOR (DRAPES) ×4 IMPLANT
DRAPE HALF SHEET 40X57 (DRAPES) ×10 IMPLANT
DRAPE INCISE IOBAN 66X45 STRL (DRAPES) ×4 IMPLANT
DRAPE INCISE IOBAN 85X60 (DRAPES) ×4 IMPLANT
DRAPE ORTHO SPLIT 77X108 STRL (DRAPES) ×8
DRAPE SURG ORHT 6 SPLT 77X108 (DRAPES) ×6 IMPLANT
DRAPE U-SHAPE 47X51 STRL (DRAPES) ×8 IMPLANT
DRILL BIT 2.8MM (BIT) ×4
DRILL BIT AO MATTA 2.5MX230M (BIT) ×4
DRILL DRAD QC 1.8 (DRILL) ×4
DRILL QC 2.5MM SHORT EVOS SM (DRILL) ×4
DRILL QC EVOS SHORT 1.8 (DRILL) ×4
DRILL STEP 3.5 (DRILL)
DRILL SURG QC EVOS 2.0 (DRILL) ×4
DRSG ADAPTIC 3X8 NADH LF (GAUZE/BANDAGES/DRESSINGS) ×3 IMPLANT
DRSG MEPILEX BORDER 4X12 (GAUZE/BANDAGES/DRESSINGS) ×2 IMPLANT
DRSG MEPILEX BORDER 4X8 (GAUZE/BANDAGES/DRESSINGS) ×2 IMPLANT
DRSG MEPITEL 4X7.2 (GAUZE/BANDAGES/DRESSINGS) ×4 IMPLANT
DRSG PAD ABDOMINAL 8X10 ST (GAUZE/BANDAGES/DRESSINGS) ×3 IMPLANT
ELECT BLADE 4.0 EZ CLEAN MEGAD (MISCELLANEOUS) ×4
ELECT BLADE 6.5 EXT (BLADE) ×4 IMPLANT
ELECT REM PT RETURN 9FT ADLT (ELECTROSURGICAL) ×4
ELECTRODE BLDE 4.0 EZ CLN MEGD (MISCELLANEOUS) IMPLANT
ELECTRODE REM PT RTRN 9FT ADLT (ELECTROSURGICAL) ×3 IMPLANT
EVACUATOR 1/8 PVC DRAIN (DRAIN) IMPLANT
GAUZE SPONGE 4X4 12PLY STRL (GAUZE/BANDAGES/DRESSINGS) ×8 IMPLANT
GLOVE SRG 8 PF TXTR STRL LF DI (GLOVE) ×3 IMPLANT
GLOVE SURG ENC MOIS LTX SZ7.5 (GLOVE) ×4 IMPLANT
GLOVE SURG ENC MOIS LTX SZ8 (GLOVE) ×4 IMPLANT
GLOVE SURG UNDER POLY LF SZ7.5 (GLOVE) ×4 IMPLANT
GLOVE SURG UNDER POLY LF SZ8 (GLOVE) ×4
GOWN STRL REUS W/ TWL LRG LVL3 (GOWN DISPOSABLE) ×6 IMPLANT
GOWN STRL REUS W/ TWL XL LVL3 (GOWN DISPOSABLE) ×6 IMPLANT
GOWN STRL REUS W/TWL LRG LVL3 (GOWN DISPOSABLE) ×8
GOWN STRL REUS W/TWL XL LVL3 (GOWN DISPOSABLE) ×8
GRAFT BNE CANC CHIPS 1-8 20CC (Bone Implant) IMPLANT
HANDPIECE INTERPULSE COAX TIP (DISPOSABLE) ×4
K-WIRE 1.6 (WIRE) ×4
K-WIRE FX150X1.6XTROC PNT (WIRE) ×3
KIT BASIN OR (CUSTOM PROCEDURE TRAY) ×5 IMPLANT
KIT TURNOVER KIT B (KITS) ×4 IMPLANT
KWIRE FX150X1.6XTROC PNT (WIRE) IMPLANT
MANIFOLD NEPTUNE II (INSTRUMENTS) ×4 IMPLANT
NDL HYPO 25X1 1.5 SAFETY (NEEDLE) IMPLANT
NEEDLE HYPO 25X1 1.5 SAFETY (NEEDLE) IMPLANT
NS IRRIG 1000ML POUR BTL (IV SOLUTION) ×4 IMPLANT
PACK ORTHO EXTREMITY (CUSTOM PROCEDURE TRAY) ×4 IMPLANT
PACK TOTAL JOINT (CUSTOM PROCEDURE TRAY) ×4 IMPLANT
PAD ARMBOARD 7.5X6 YLW CONV (MISCELLANEOUS) ×8 IMPLANT
PAD CAST 4YDX4 CTTN HI CHSV (CAST SUPPLIES) IMPLANT
PADDING CAST COTTON 4X4 STRL (CAST SUPPLIES) ×8
PIN APEX 180X50X5XST SLF (PIN) IMPLANT
PIN APEX 5X180 (PIN) ×4
PIN APEX 6X180MM EXFIX (EXFIX) ×1 IMPLANT
PLATE ACET STRT 70.5M 6H (Plate) ×1 IMPLANT
PLATE ACET STRT 94.5M 8H (Plate) ×1 IMPLANT
PLATE FLEX EVOS 2.7 20H (Plate) ×1 IMPLANT
PLATE FLEX MATTA 7H (Plate) ×1 IMPLANT
PLATE VOLAR STD EVOS 105 7H LT (Plate) ×1 IMPLANT
PROS LCP PLATE 12 163M (Plate) ×4 IMPLANT
PROSTHESIS LCP PLATE 12 163M (Plate) IMPLANT
RETRIEVER SUT HEWSON (MISCELLANEOUS) ×4 IMPLANT
SCREW BN T15 12X3.5 STRL CORT (Screw) IMPLANT
SCREW BN T15 13X3.5 STRL CORT (Screw) IMPLANT
SCREW BN T15 14X3.5XST CORT (Screw) IMPLANT
SCREW CORT 2.7X12 EVOS (Screw) IMPLANT
SCREW CORT 2.7X14 T8 EVOS (Screw) ×1 IMPLANT
SCREW CORT 2.7X16 ST EVOS (Screw) ×1 IMPLANT
SCREW CORT 2.7X18 T8 ST EVOS (Screw) ×1 IMPLANT
SCREW CORT 3.5X12 (Screw) ×8 IMPLANT
SCREW CORT 3.5X13 (Screw) ×8 IMPLANT
SCREW CORT 3.5X14 (Screw) ×4 IMPLANT
SCREW CORT EVOS ST 2.7X13 (Screw) ×8 IMPLANT
SCREW CORT EVOS ST T8 2.7X14MM (Screw) ×8 IMPLANT
SCREW CORT ST 2.4X20 (Screw) ×1 IMPLANT
SCREW CORT ST EVOS 2.7X14 (Screw) ×1 IMPLANT
SCREW CORT THRD 2.4X14 (Screw) ×1 IMPLANT
SCREW CORTEX 3.5 20MM (Screw) ×1 IMPLANT
SCREW CORTEX 3.5 22MM (Screw) ×2 IMPLANT
SCREW CORTEX 3.5 24MM (Screw) ×1 IMPLANT
SCREW CORTEX 3.5 26MM (Screw) ×1 IMPLANT
SCREW CORTEX 3.5 28MM (Screw) ×1 IMPLANT
SCREW CORTEX 3.5 32MM (Screw) ×1 IMPLANT
SCREW CORTEX ST MATTA 3.5X12MM (Screw) ×1 IMPLANT
SCREW CORTEX ST MATTA 3.5X18MM (Screw) ×1 IMPLANT
SCREW CORTEX ST MATTA 3.5X20 (Screw) ×2 IMPLANT
SCREW CORTEX ST MATTA 3.5X26MM (Screw) ×1 IMPLANT
SCREW CORTEX ST MATTA 3.5X30MM (Screw) ×2 IMPLANT
SCREW CORTEX ST MATTA 3.5X34MM (Screw) ×2 IMPLANT
SCREW CORTEX ST MATTA 3.5X36MM (Screw) ×1 IMPLANT
SCREW CORTEX ST MATTA 3.5X40MM (Screw) ×2 IMPLANT
SCREW LOCK 14X2.4X ST T7 (Screw) IMPLANT
SCREW LOCK 2.4X13 (Screw) ×1 IMPLANT
SCREW LOCK 2.4X14 (Screw) ×4 IMPLANT
SCREW LOCK 2.4X16 (Screw) ×8 IMPLANT
SCREW LOCK 2.4X18 (Screw) ×2 IMPLANT
SCREW LOCK 2.4X20 (Screw) ×8 IMPLANT
SCREW LOCK 2.7X13 ST EVOS (Screw) ×1 IMPLANT
SCREW LOCK CORT ST 3.5X20 (Screw) IMPLANT
SCREW LOCK CORT ST 3.5X22 (Screw) IMPLANT
SCREW LOCK CORT ST 3.5X24 (Screw) IMPLANT
SCREW LOCK CORT ST 3.5X26 (Screw) IMPLANT
SCREW LOCK CORT ST 3.5X28 (Screw) IMPLANT
SCREW LOCK CORT ST 3.5X32 (Screw) IMPLANT
SCREW LOCK T15 FT 22X3.5XST (Screw) IMPLANT
SCREW LOCK T15 FT 30X3.5X2.9X (Screw) IMPLANT
SCREW LOCK T7 16X2.4X ST FT (Screw) IMPLANT
SCREW LOCK T7 20X2.4X ST FT (Screw) IMPLANT
SCREW LOCKING 3.5X22 (Screw) ×4 IMPLANT
SCREW LOCKING 3.5X30 (Screw) ×4 IMPLANT
SET HNDPC FAN SPRY TIP SCT (DISPOSABLE) ×3 IMPLANT
SPONGE T-LAP 18X18 ~~LOC~~+RFID (SPONGE) ×2 IMPLANT
STAPLER VISISTAT 35W (STAPLE) ×4 IMPLANT
STOCKINETTE IMPERVIOUS 9X36 MD (GAUZE/BANDAGES/DRESSINGS) IMPLANT
STRIP CLOSURE SKIN 1/2X4 (GAUZE/BANDAGES/DRESSINGS) ×3 IMPLANT
SUCTION FRAZIER HANDLE 10FR (MISCELLANEOUS) ×4
SUCTION TUBE FRAZIER 10FR DISP (MISCELLANEOUS) ×3 IMPLANT
SUT ETHILON 2 0 PSLX (SUTURE) ×10 IMPLANT
SUT ETHILON 3 0 PS 1 (SUTURE) ×7 IMPLANT
SUT FIBERWIRE #2 38 T-5 BLUE (SUTURE) ×8
SUT VIC AB 0 CT1 27 (SUTURE) ×4
SUT VIC AB 0 CT1 27XBRD ANBCTR (SUTURE) ×6 IMPLANT
SUT VIC AB 1 CT1 18XCR BRD 8 (SUTURE) ×3 IMPLANT
SUT VIC AB 1 CT1 27 (SUTURE) ×4
SUT VIC AB 1 CT1 27XBRD ANBCTR (SUTURE) ×3 IMPLANT
SUT VIC AB 1 CT1 8-18 (SUTURE) ×4
SUT VIC AB 2-0 CT1 27 (SUTURE) ×8
SUT VIC AB 2-0 CT1 TAPERPNT 27 (SUTURE) ×6 IMPLANT
SUTURE FIBERWR #2 38 T-5 BLUE (SUTURE) ×6 IMPLANT
SYR 5ML LL (SYRINGE) IMPLANT
SYR CONTROL 10ML LL (SYRINGE) IMPLANT
TOWEL GREEN STERILE (TOWEL DISPOSABLE) ×12 IMPLANT
TOWEL GREEN STERILE FF (TOWEL DISPOSABLE) ×8 IMPLANT
TRAY FOLEY MTR SLVR 16FR STAT (SET/KITS/TRAYS/PACK) ×1 IMPLANT
WATER STERILE IRR 1000ML POUR (IV SOLUTION) ×3 IMPLANT
YANKAUER SUCT BULB TIP NO VENT (SUCTIONS) IMPLANT
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
## 2021-06-04 NOTE — Progress Notes (Signed)
I called patient's daughter Curt Bears at this time and let her know that patient is in [place].  Montez Hageman RN
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
## 2021-06-04 NOTE — Anesthesia Postprocedure Evaluation (Signed)
Anesthesia Post Note  Patient: Melissa Harrington  Procedure(s) Performed: OPEN REDUCTION INTERNAL FIXATION (ORIF) RIGHT ACETABULAR FRACTURE (Right: Hip) OPEN REDUCTION INTERNAL FIXATION (ORIF) LEFT DISTAL HUMERUS FRACTURE (Left: Arm Upper) OPEN REDUCTION INTERNAL FIXATION (ORIF) LEFT FOREARM (Left: Arm Lower)     Patient location during evaluation: SICU Anesthesia Type: General Level of consciousness: sedated Pain management: pain level controlled Vital Signs Assessment: post-procedure vital signs reviewed and stable Respiratory status: patient remains intubated per anesthesia plan Cardiovascular status: stable and tachycardic Postop Assessment: no apparent nausea or vomiting Anesthetic complications: no   No notable events documented.  Last Vitals:  Vitals:   06/04/21 2000 06/04/21 2100  BP: (!) 145/91 136/83  Pulse: (!) 136 (!) 136  Resp: (!) 27 (!) 27  Temp: 37.4 C 37.4 C  SpO2: 97% 96%    Last Pain:  Vitals:   06/04/21 1810  TempSrc: Esophageal  PainSc:                  Melissa Harrington,W. EDMOND

## 2021-06-04 NOTE — Progress Notes (Signed)
[facility] (OTS)  Day of Surgery Procedure(s) (LRB): OPEN REDUCTION INTERNAL FIXATION (ORIF) RIGHT ACETABULAR FRACTURE (Right) OPEN REDUCTION INTERNAL FIXATION (ORIF) LEFT DISTAL HUMERUS FRACTURE (Left) OPEN REDUCTION INTERNAL FIXATION (ORIF) LEFT FOREARM (Left)  Subjective: Intubated. Spoke with both daughters at bedside yesterday evening, reviewed surgical plan and back up plan. Obtained consent.  Objective: Current Vitals Blood pressure (!) 163/106, pulse (!) 118, temperature 99.68 F (37.6 C), resp. rate (!) 26, height 5\' 8"  (1.727 m), weight 89.3 kg, SpO2 93 %. Vital signs in last 24 hours: Temp:  [97.88 F (36.6 C)-101.8 F (38.8 C)] 99.68 F (37.6 C) (07/19 0700) Pulse Rate:  [99-121] 118 (07/19 0755) Resp:  [19-27] 26 (07/19 0755) BP: (114-163)/(64-106) 163/106 (07/19 0700) SpO2:  [90 %-100 %] 93 % (07/19 0755) Arterial Line BP: (104-186)/(55-131) 186/106 (07/19 0700) FiO2 (%):  [40 %-50 %] 40 % (07/19 0755)  Intake/Output from previous day: 07/18 0701 - 07/19 0700 In: 1913.3 [I.V.:1193.4; NG/GT:420; IV Piggyback:299.9] Out: 3420 [Urine:3270; Stool:150]  LABS Recent Labs    06/02/21 0500 06/03/21 0520 06/04/21 0538  HGB 7.2* 7.1* 7.5*   Recent Labs    06/03/21 0520 06/04/21 0538  WBC 8.3 7.9  RBC 2.41* 2.52*  HCT 23.0* 25.0*  PLT 216 266   Recent Labs    06/03/21 0520 06/04/21 0538  NA 150* 151*  K 3.4* 3.6  CL 108 111  CO2 32 32  BUN 28* 29*  CREATININE 0.73 0.69  GLUCOSE 227* 177*  CALCIUM 8.2* 8.3*   No results for input(s): LABPT, INR in the last 72 hours.   Physical Exam LUE Splint in place  Brisk CR R&LLE  Dressings intact, clean, dry  Edema/ swelling controlled  Brisk cap refill, warm to touch  Assessment/Plan: Day of Surgery Procedure(s) (LRB): OPEN REDUCTION INTERNAL FIXATION (ORIF) RIGHT ACETABULAR FRACTURE (Right) OPEN REDUCTION INTERNAL FIXATION (ORIF) LEFT DISTAL HUMERUS FRACTURE (Left) OPEN REDUCTION  INTERNAL FIXATION (ORIF) LEFT FOREARM (Left)  Plan for ORIF right acetabulum first, followed by humerus and both bone forearm. If does not tolerate being on her side then will reposition on back with head inclined to see if that position is well tolerated. If so, will proceed with humerus and both bone, deferring the acetab until next week.  I discussed with the patient's daughters the risks and benefits of surgery, including the possibility of death, stroke, heart attack, infection, nerve injury, vessel injury, wound breakdown, arthritis, symptomatic hardware, DVT/ PE, loss of motion, malunion, nonunion, and need for further surgery among others.  We also specifically discussed the possible need to stage surgery if systemic or pulmonary issues arise. They acknowledged these risks and provided consent to proceed.   Altamese Kilbourne, MD [facility], [facility] [phone]
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
## 2021-06-04 NOTE — Progress Notes (Signed)
I saw an order to transfuse 2 units of RBC. I called Lance Coon CRNA to clarify if I need to start the blood prior to OR. He confirmed with me that the RBC are going to be started in OR and not in the ICU.   Montez Hageman, RN

## 2021-06-04 NOTE — Progress Notes (Signed)
Patient taken down to OR at this time. Report given to CRNA. VSS. Patient expected to be in OR until at least 1700.   Montez Hageman, RN
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
## 2021-06-04 NOTE — Transfer of Care (Signed)
Immediate Anesthesia Transfer of Care Note  Patient: Melissa Harrington  Procedure(s) Performed: OPEN REDUCTION INTERNAL FIXATION (ORIF) RIGHT ACETABULAR FRACTURE (Right: Hip) OPEN REDUCTION INTERNAL FIXATION (ORIF) LEFT DISTAL HUMERUS FRACTURE (Left: Arm Upper) OPEN REDUCTION INTERNAL FIXATION (ORIF) LEFT FOREARM (Left: Arm Lower)  Patient Location: ICU  Anesthesia Type:General  Level of Consciousness: sedated and Patient remains intubated per anesthesia plan  Airway & Oxygen Therapy: Patient remains intubated per anesthesia plan  Post-op Assessment: Report given to RN and Post -op Vital signs reviewed and stable  Post vital signs: Reviewed and stable  Last Vitals:  Vitals Value Taken Time  BP    Temp    Pulse    Resp    SpO2      Last Pain:  Vitals:   06/04/21 0200  TempSrc: Esophageal  PainSc:          Complications: No notable events documented.
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
## 2021-06-04 NOTE — Progress Notes (Addendum)
Patient arrived back from OR at 1810 to 4N with no complications. VSS stable.  I called patient's daughter Tim Lair to let her know that patient returned from surgery and all went well.   Montez Hageman RN

## 2021-06-04 NOTE — OR Nursing (Signed)
Per protocol Dr. Randa Ngo called on 06/04/2021 at 1804 and confirmed that there was no instrumentation or foreign body retained.
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
## 2021-06-05 ENCOUNTER — Encounter (HOSPITAL_COMMUNITY): Payer: Self-pay | Admitting: Orthopedic Surgery

## 2021-06-05 LAB — GLUCOSE, CAPILLARY
Glucose-Capillary: 206 mg/dL — ABNORMAL HIGH (ref 70–99)
Glucose-Capillary: 216 mg/dL — ABNORMAL HIGH (ref 70–99)
Glucose-Capillary: 223 mg/dL — ABNORMAL HIGH (ref 70–99)
Glucose-Capillary: 237 mg/dL — ABNORMAL HIGH (ref 70–99)
Glucose-Capillary: 249 mg/dL — ABNORMAL HIGH (ref 70–99)
Glucose-Capillary: 261 mg/dL — ABNORMAL HIGH (ref 70–99)

## 2021-06-05 LAB — CBC
HCT: 22 % — ABNORMAL LOW (ref 36.0–46.0)
Hemoglobin: 7 g/dL — ABNORMAL LOW (ref 12.0–15.0)
MCH: 31 pg (ref 26.0–34.0)
MCHC: 31.8 g/dL (ref 30.0–36.0)
MCV: 97.3 fL (ref 80.0–100.0)
Platelets: 290 10*3/uL (ref 150–400)
RBC: 2.26 MIL/uL — ABNORMAL LOW (ref 3.87–5.11)
RDW: 19.1 % — ABNORMAL HIGH (ref 11.5–15.5)
WBC: 8.7 10*3/uL (ref 4.0–10.5)
nRBC: 1.4 % — ABNORMAL HIGH (ref 0.0–0.2)

## 2021-06-05 LAB — BASIC METABOLIC PANEL
Anion gap: 8 (ref 5–15)
BUN: 20 mg/dL (ref 6–20)
CO2: 29 mmol/L (ref 22–32)
Calcium: 8 mg/dL — ABNORMAL LOW (ref 8.9–10.3)
Chloride: 116 mmol/L — ABNORMAL HIGH (ref 98–111)
Creatinine, Ser: 0.69 mg/dL (ref 0.44–1.00)
GFR, Estimated: >60 mL/min (ref >60)
Glucose, Bld: 209 mg/dL — ABNORMAL HIGH (ref 70–99)
Potassium: 3.6 mmol/L (ref 3.5–5.1)
Sodium: 153 mmol/L — ABNORMAL HIGH (ref 135–145)

## 2021-06-05 LAB — PREPARE RBC (CROSSMATCH)

## 2021-06-05 LAB — APTT: aPTT: 53 s — ABNORMAL HIGH (ref 24–36)

## 2021-06-05 MED ORDER — INSULIN DETEMIR 100 UNIT/ML ~~LOC~~ SOLN
20.0000 [IU] | Freq: Two times a day (BID) | SUBCUTANEOUS | Status: DC
  Administered 2021-06-05 (×2): 20 [IU] via SUBCUTANEOUS
  Filled 2021-06-05 (×4): qty 0.2

## 2021-06-05 MED ORDER — FREE WATER
300.0000 mL | Status: DC
  Administered 2021-06-05 – 2021-06-07 (×9): 300 mL

## 2021-06-05 MED ORDER — SODIUM CHLORIDE 0.9% IV SOLUTION: Freq: Once | INTRAVENOUS | Status: AC

## 2021-06-05 MED ORDER — METOPROLOL TARTRATE 5 MG/5ML IV SOLN
5.0000 mg | Freq: Four times a day (QID) | INTRAVENOUS | Status: DC | PRN
  Administered 2021-06-05 – 2021-06-06 (×6): 5 mg via INTRAVENOUS
  Filled 2021-06-05 (×6): qty 5

## 2021-06-05 MED ORDER — SPIRITUS FRUMENTI
1.0000 | Freq: Two times a day (BID) | ORAL | Status: DC
  Administered 2021-06-05 (×2): 1
  Filled 2021-06-05 (×3): qty 1

## 2021-06-05 NOTE — Progress Notes (Signed)
[facility] (OTS)  1 Day Post-Op Procedure(s) (LRB): OPEN REDUCTION INTERNAL FIXATION (ORIF) RIGHT ACETABULAR FRACTURE (Right) OPEN REDUCTION INTERNAL FIXATION (ORIF) LEFT DISTAL HUMERUS FRACTURE (Left) OPEN REDUCTION INTERNAL FIXATION (ORIF) LEFT FOREARM (Left)  Subjective: Patient intubated but responsive and following commands re feet but not hands.  Objective: Current Vitals Blood pressure 134/81, pulse (!) 114, temperature (!) 102.92 F (39.4 C), resp. rate (!) 24, height 5\' 8"  (1.727 m), weight 89.3 kg, SpO2 96 %. Vital signs in last 24 hours: Temp:  [97.7 F (36.5 C)-102.92 F (39.4 C)] 102.92 F (39.4 C) (07/20 1300) Pulse Rate:  [111-141] 114 (07/20 1300) Resp:  [19-27] 24 (07/20 1300) BP: (118-156)/(71-97) 134/81 (07/20 1300) SpO2:  [93 %-100 %] 96 % (07/20 1300) Arterial Line BP: (133-167)/(70-94) 143/75 (07/20 1000) FiO2 (%):  [40 %] 40 % (07/20 1058)  Intake/Output from previous day: 07/19 0701 - 07/20 0700 In: 5492.1 [I.V.:3287.6; Blood:596; NG/GT:808.5; IV Piggyback:800] Out: 4290 [Urine:3560; Blood:730]  LABS Recent Labs    06/03/21 0520 06/04/21 0538 06/04/21 0924 06/04/21 1100 06/05/21 0206  HGB 7.1* 7.5* 7.1* 8.5* 7.0*   Recent Labs    06/04/21 0538 06/04/21 0924 06/04/21 1100 06/05/21 0206  WBC 7.9  --   --  8.7  RBC 2.52*  --   --  2.26*  HCT 25.0*   < > 25.0* 22.0*  PLT 266  --   --  290   < > = values in this interval not displayed.   Recent Labs    06/03/21 0520 06/04/21 0538 06/04/21 0924 06/04/21 1100  NA 150* 151* 154* 153*  K 3.4* 3.6 3.7 4.0  CL 108 111  --   --   CO2 32 32  --   --   BUN 28* 29*  --   --   CREATININE 0.73 0.69  --   --   GLUCOSE 227* 177*  --   --   CALCIUM 8.2* 8.3*  --   --    No results for input(s): LABPT, INR in the last 72 hours.   Physical Exam LUE dressings clean and dry; no movement to request  R&LLE  Dressings intact, clean, dry  Edema/ swelling controlled  Sens: DPN,  SPN, TN intact  Motor: EHL, FHL, and lessor toe ext and flex all intact grossly  Brisk cap refill, warm to touch  Assessment/Plan: 1 Day Post-Op Procedure(s) (LRB): OPEN REDUCTION INTERNAL FIXATION (ORIF) RIGHT ACETABULAR FRACTURE (Right) OPEN REDUCTION INTERNAL FIXATION (ORIF) LEFT DISTAL HUMERUS FRACTURE (Left) OPEN REDUCTION INTERNAL FIXATION (ORIF) LEFT FOREARM (Left)  Posterior hip precautions right hip; o/w no ROM restrictions; NWB BLE Will be on vacation until Tue; please contact my office if questions  Altamese Lyons, MD [facility], [facility] [phone]
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
## 2021-06-05 NOTE — Progress Notes (Signed)
Inpatient Rehab Admissions Coordinator Note:   Per PT recommendations, pt was screened for CIR candidacy by Shann Medal, PT, DPT.  At this time pt on the vent.  Will follow from a distance for progress with therapy once extubated before assessing for candidacy.  Please contact me with questions.   Shann Medal, PT, DPT [phone] 06/05/21 3:26 PM
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
## 2021-06-05 NOTE — Progress Notes (Signed)
ANTICOAGULATION CONSULT NOTE - Follow Up Consult  Pharmacy Consult for Argatroban Indication: pulmonary embolus  Allergies  Allergen Reactions   Alpha-Gal Anaphylaxis   Amlodipine Rash   Benazepril Rash   Erythromycin Rash   Penicillins Hives    Patient Measurements: Height: 5\' 8"  (172.7 cm) Weight: 89.3 kg (196 lb 13.9 oz) IBW/kg (Calculated) : 63.9 Heparin Dosing Weight:    Vital Signs: Temp: 100.58 F (38.1 C) (07/20 0925) Temp Source: Esophageal (07/20 0749) BP: 147/97 (07/20 0900) Pulse Rate: 135 (07/20 0925)  Labs: Recent Labs    06/03/21 0520 06/03/21 1241 06/04/21 0538 06/04/21 0924 06/04/21 1100 06/04/21 2000 06/05/21 0206 06/05/21 0836  HGB 7.1*  --  7.5* 7.1* 8.5*  --  7.0*  --   HCT 23.0*  --  25.0* 21.0* 25.0*  --  22.0*  --   PLT 216  --  266  --   --   --  290  --   APTT 46*   < > 45*  --   --  56*  --  53*  CREATININE 0.73  --  0.69  --   --   --   --   --    < > = values in this interval not displayed.    Estimated Creatinine Clearance: 97.3 mL/min (by C-G formula based on SCr of 0.69 mg/dL).   Assessment:  Anticoag:TBI with SDH/SAH, LE fractures. s/p OR 7/11 with desat during surgery >> CTA +RUL and RLL non-occlusive emboli.  Blood clot vs fat emobli. R heart strain. 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  - PE but clots are too small for thrombectomy  - aPTT 53, Hgb 7. Plts 290 rising  Goal of Therapy:  aPTT 50-70 seconds Monitor platelets by anticoagulation protocol: Yes   Plan:  -Cont argatroban to 0.82 mcg/kg/min -Daily aPTT, CBC -Monitor closely for s/s of overt bleeding   Hans Rusher S. Alford Highland, PharmD, BCPS Clinical Staff  Pharmacist [email or web address] Alford Highland, [employer] 06/05/2021,9:34 AM

## 2021-06-06 ENCOUNTER — Inpatient Hospital Stay (HOSPITAL_COMMUNITY): Payer: 59 | Admitting: Certified Registered Nurse Anesthetist

## 2021-06-06 ENCOUNTER — Inpatient Hospital Stay (HOSPITAL_COMMUNITY): Payer: 59

## 2021-06-06 ENCOUNTER — Encounter (HOSPITAL_COMMUNITY): Payer: Self-pay | Admitting: Orthopedic Surgery

## 2021-06-06 LAB — CBC
HCT: 23.6 % — ABNORMAL LOW (ref 36.0–46.0)
Hemoglobin: 7.3 g/dL — ABNORMAL LOW (ref 12.0–15.0)
MCH: 30.3 pg (ref 26.0–34.0)
MCHC: 30.9 g/dL (ref 30.0–36.0)
MCV: 97.9 fL (ref 80.0–100.0)
Platelets: 290 10*3/uL (ref 150–400)
RBC: 2.41 MIL/uL — ABNORMAL LOW (ref 3.87–5.11)
RDW: 20 % — ABNORMAL HIGH (ref 11.5–15.5)
WBC: 9.5 10*3/uL (ref 4.0–10.5)
nRBC: 0.7 % — ABNORMAL HIGH (ref 0.0–0.2)

## 2021-06-06 LAB — GLUCOSE, CAPILLARY
Glucose-Capillary: 113 mg/dL — ABNORMAL HIGH (ref 70–99)
Glucose-Capillary: 127 mg/dL — ABNORMAL HIGH (ref 70–99)
Glucose-Capillary: 134 mg/dL — ABNORMAL HIGH (ref 70–99)
Glucose-Capillary: 166 mg/dL — ABNORMAL HIGH (ref 70–99)
Glucose-Capillary: 182 mg/dL — ABNORMAL HIGH (ref 70–99)
Glucose-Capillary: 221 mg/dL — ABNORMAL HIGH (ref 70–99)

## 2021-06-06 LAB — BASIC METABOLIC PANEL
Anion gap: 7 (ref 5–15)
BUN: 16 mg/dL (ref 6–20)
CO2: 29 mmol/L (ref 22–32)
Calcium: 8.1 mg/dL — ABNORMAL LOW (ref 8.9–10.3)
Chloride: 117 mmol/L — ABNORMAL HIGH (ref 98–111)
Creatinine, Ser: 0.63 mg/dL (ref 0.44–1.00)
GFR, Estimated: >60 mL/min (ref >60)
Glucose, Bld: 139 mg/dL — ABNORMAL HIGH (ref 70–99)
Potassium: 3.3 mmol/L — ABNORMAL LOW (ref 3.5–5.1)
Sodium: 153 mmol/L — ABNORMAL HIGH (ref 135–145)

## 2021-06-06 LAB — APTT
aPTT: 38 s — ABNORMAL HIGH (ref 24–36)
aPTT: 44 s — ABNORMAL HIGH (ref 24–36)

## 2021-06-06 LAB — MAGNESIUM: Magnesium: 2.4 mg/dL (ref 1.7–2.4)

## 2021-06-06 MED ORDER — FENTANYL CITRATE (PF) 100 MCG/2ML IJ SOLN
50.0000 ug | Freq: Once | INTRAMUSCULAR | Status: AC
  Administered 2021-06-06: 50 ug via INTRAVENOUS

## 2021-06-06 MED ORDER — THIAMINE HCL 100 MG PO TABS: 100.0000 mg | ORAL_TABLET | Freq: Every day | ORAL | Status: DC

## 2021-06-06 MED ORDER — FENTANYL BOLUS VIA INFUSION
50.0000 ug | INTRAVENOUS | Status: DC | PRN
  Administered 2021-06-06 – 2021-06-07 (×2): 100 ug via INTRAVENOUS
  Filled 2021-06-06: qty 100

## 2021-06-06 MED ORDER — POTASSIUM CHLORIDE CRYS ER 20 MEQ PO TBCR: 40.0000 meq | EXTENDED_RELEASE_TABLET | Freq: Four times a day (QID) | ORAL | Status: DC

## 2021-06-06 MED ORDER — CHLORDIAZEPOXIDE HCL 25 MG PO CAPS: 25.0000 mg | ORAL_CAPSULE | Freq: Three times a day (TID) | ORAL | Status: DC

## 2021-06-06 MED ORDER — ADULT MULTIVITAMIN LIQUID CH
15.0000 mL | Freq: Every day | ORAL | Status: DC
  Administered 2021-06-06 – 2021-06-07 (×2): 15 mL
  Filled 2021-06-06 (×2): qty 15

## 2021-06-06 MED ORDER — FENTANYL 2500MCG IN NS 250ML (10MCG/ML) PREMIX INFUSION
50.0000 ug/h | INTRAVENOUS | Status: DC
  Administered 2021-06-06: 200 ug/h via INTRAVENOUS
  Administered 2021-06-07: 175 ug/h via INTRAVENOUS
  Administered 2021-06-07 – 2021-06-08 (×2): 200 ug/h via INTRAVENOUS
  Filled 2021-06-06 (×4): qty 250

## 2021-06-06 MED ORDER — ALBUTEROL SULFATE (2.5 MG/3ML) 0.083% IN NEBU
2.5000 mg | INHALATION_SOLUTION | RESPIRATORY_TRACT | Status: DC | PRN
  Administered 2021-06-06: 2.5 mg via RESPIRATORY_TRACT

## 2021-06-06 MED ORDER — THIAMINE HCL 100 MG PO TABS
100.0000 mg | ORAL_TABLET | Freq: Every day | ORAL | Status: DC
  Administered 2021-06-06 – 2021-06-14 (×9): 100 mg
  Filled 2021-06-06 (×9): qty 1

## 2021-06-06 MED ORDER — ALBUTEROL SULFATE (2.5 MG/3ML) 0.083% IN NEBU
INHALATION_SOLUTION | RESPIRATORY_TRACT | Status: AC
  Filled 2021-06-06: qty 3

## 2021-06-06 MED ORDER — POTASSIUM CHLORIDE 10 MEQ/50ML IV SOLN
10.0000 meq | INTRAVENOUS | Status: AC
  Administered 2021-06-06 (×3): 10 meq via INTRAVENOUS
  Filled 2021-06-06 (×3): qty 50

## 2021-06-06 MED ORDER — FUROSEMIDE 10 MG/ML IJ SOLN
INTRAMUSCULAR | Status: AC
  Filled 2021-06-06: qty 4

## 2021-06-06 MED ORDER — RACEPINEPHRINE HCL 2.25 % IN NEBU
0.5000 mL | INHALATION_SOLUTION | Freq: Once | RESPIRATORY_TRACT | Status: AC
  Administered 2021-06-06: 0.5 mL via RESPIRATORY_TRACT

## 2021-06-06 MED ORDER — OXYCODONE HCL 5 MG/5ML PO SOLN
5.0000 mg | ORAL | Status: DC | PRN
  Administered 2021-06-06 – 2021-06-07 (×7): 15 mg
  Administered 2021-06-08 (×2): 10 mg
  Administered 2021-06-08: 15 mg
  Administered 2021-06-09 (×3): 10 mg
  Administered 2021-06-10: 5 mg
  Administered 2021-06-10 – 2021-06-12 (×5): 10 mg
  Administered 2021-06-12 (×3): 5 mg
  Administered 2021-06-13 – 2021-06-14 (×5): 10 mg
  Filled 2021-06-06: qty 15
  Filled 2021-06-06 (×2): qty 10
  Filled 2021-06-06: qty 15
  Filled 2021-06-06: qty 5
  Filled 2021-06-06 (×3): qty 10
  Filled 2021-06-06: qty 15
  Filled 2021-06-06 (×5): qty 10
  Filled 2021-06-06: qty 15
  Filled 2021-06-06 (×3): qty 10
  Filled 2021-06-06: qty 5
  Filled 2021-06-06: qty 10
  Filled 2021-06-06: qty 15
  Filled 2021-06-06 (×2): qty 5
  Filled 2021-06-06 (×3): qty 15
  Filled 2021-06-06 (×3): qty 10

## 2021-06-06 MED ORDER — SUCCINYLCHOLINE CHLORIDE 20 MG/ML IJ SOLN
INTRAMUSCULAR | Status: DC | PRN
  Administered 2021-06-06: 200 mg via INTRAVENOUS

## 2021-06-06 MED ORDER — PROPOFOL 10 MG/ML IV BOLUS
INTRAVENOUS | Status: DC | PRN
  Administered 2021-06-06: 150 mg via INTRAVENOUS

## 2021-06-06 MED ORDER — MIDAZOLAM HCL 2 MG/2ML IJ SOLN
2.0000 mg | INTRAMUSCULAR | Status: DC | PRN
  Administered 2021-06-06 (×2): 2 mg via INTRAVENOUS
  Filled 2021-06-06 (×2): qty 2

## 2021-06-06 MED ORDER — POTASSIUM CHLORIDE 10 MEQ/50ML IV SOLN
10.0000 meq | Freq: Once | INTRAVENOUS | Status: AC
  Administered 2021-06-06: 10 meq via INTRAVENOUS
  Filled 2021-06-06: qty 50

## 2021-06-06 MED ORDER — FUROSEMIDE 10 MG/ML IJ SOLN
80.0000 mg | Freq: Once | INTRAMUSCULAR | Status: AC
  Administered 2021-06-06: 80 mg via INTRAVENOUS
  Filled 2021-06-06: qty 8

## 2021-06-06 MED ORDER — RACEPINEPHRINE HCL 2.25 % IN NEBU
INHALATION_SOLUTION | RESPIRATORY_TRACT | Status: AC
  Filled 2021-06-06: qty 1

## 2021-06-06 MED ORDER — MIDAZOLAM HCL 2 MG/2ML IJ SOLN: 1.0000 mg | INTRAMUSCULAR | Status: DC | PRN

## 2021-06-06 MED ORDER — HYDROMORPHONE HCL 1 MG/ML IJ SOLN
0.5000 mg | INTRAMUSCULAR | Status: DC | PRN
  Filled 2021-06-06: qty 1

## 2021-06-06 MED ORDER — FOLIC ACID 1 MG PO TABS
1.0000 mg | ORAL_TABLET | Freq: Every day | ORAL | Status: DC
  Administered 2021-06-06 – 2021-06-15 (×10): 1 mg
  Filled 2021-06-06 (×9): qty 1

## 2021-06-06 MED ORDER — ADULT MULTIVITAMIN W/MINERALS CH: 1.0000 | ORAL_TABLET | Freq: Every day | ORAL | Status: DC

## 2021-06-06 MED ORDER — DEXMEDETOMIDINE HCL IN NACL 400 MCG/100ML IV SOLN
0.2000 ug/kg/h | INTRAVENOUS | Status: DC
  Administered 2021-06-06: 0.4 ug/kg/h via INTRAVENOUS
  Administered 2021-06-07 (×2): 0.7 ug/kg/h via INTRAVENOUS
  Administered 2021-06-07: 0.8 ug/kg/h via INTRAVENOUS
  Administered 2021-06-07: 0.7 ug/kg/h via INTRAVENOUS
  Administered 2021-06-07 – 2021-06-08 (×3): 1 ug/kg/h via INTRAVENOUS
  Filled 2021-06-06 (×9): qty 100

## 2021-06-06 MED ORDER — INSULIN DETEMIR 100 UNIT/ML ~~LOC~~ SOLN
5.0000 [IU] | Freq: Two times a day (BID) | SUBCUTANEOUS | Status: DC
  Administered 2021-06-06: 5 [IU] via SUBCUTANEOUS
  Filled 2021-06-06 (×3): qty 0.05

## 2021-06-06 MED ORDER — LACTATED RINGERS IV SOLN: INTRAVENOUS | Status: DC

## 2021-06-06 MED ORDER — POTASSIUM CHLORIDE 10 MEQ/100ML IV SOLN
INTRAVENOUS | Status: AC
  Filled 2021-06-06: qty 100

## 2021-06-06 MED ORDER — LORAZEPAM 2 MG/ML IJ SOLN
1.0000 mg | INTRAMUSCULAR | Status: DC | PRN
  Administered 2021-06-06 – 2021-06-08 (×5): 2 mg via INTRAVENOUS
  Administered 2021-06-09 (×2): 1 mg via INTRAVENOUS
  Filled 2021-06-06 (×7): qty 1

## 2021-06-06 MED ORDER — FOLIC ACID 1 MG PO TABS: 1.0000 mg | ORAL_TABLET | Freq: Every day | ORAL | Status: DC

## 2021-06-06 MED ORDER — CHLORDIAZEPOXIDE HCL 25 MG PO CAPS
25.0000 mg | ORAL_CAPSULE | Freq: Three times a day (TID) | ORAL | Status: DC
  Administered 2021-06-06 – 2021-06-09 (×11): 25 mg
  Filled 2021-06-06 (×11): qty 1
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
## 2021-06-06 NOTE — Progress Notes (Signed)
Inpatient Diabetes Program Recommendations  AACE/ADA: New Consensus Statement on Inpatient Glycemic Control (2015)  Target Ranges:  Prepandial:   less than 140 mg/dL      Peak postprandial:   less than 180 mg/dL (1-2 hours)      Critically ill patients:  140 - 180 mg/dL   Lab Results  Component Value Date   GLUCAP 113 (H) 06/06/2021    Review of Glycemic Control Results for ARIANI, SEIER (MRN [id number]) as of 06/06/2021 12:45  Ref. Range 06/05/2021 19:40 06/05/2021 23:31 06/06/2021 03:40 06/06/2021 07:40 06/06/2021 11:36  Glucose-Capillary Latest Ref Range: 70 - 99 mg/dL 223 (H) 206 (H) 221 (H) 134 (H) 113 (H)   Diabetes history:None Current orders for Inpatient glycemic control:  Novolog resistant q 4 hours Levemir 20 units bid Inpatient Diabetes Program Recommendations:    Note patient extubated and tube feeds off.  Consider reduction of Levemir to 5 units bid.   Thanks  Adah Perl, RN, BC-ADM Inpatient Diabetes Coordinator Pager [phone]  (8a-5p)

## 2021-06-06 NOTE — Progress Notes (Signed)
RT transported pt to and from CT without event. 

## 2021-06-06 NOTE — Progress Notes (Signed)
SLP Cancellation Note  Patient Details Name: JOLEENE BURNHAM MRN: [id number] DOB: 08/01/1971   Cancelled treatment:       Reason Eval/Treat Not Completed: Patient not medically ready. Pt reintubated. Will f/u for readiness.    Minetta Krisher, Katherene Ponto 06/06/2021, 1:55 PM

## 2021-06-06 NOTE — Progress Notes (Signed)
Bladder scan of 448m, no I&O caths available, Dr. [place], ok to place a foley.

## 2021-06-06 NOTE — Anesthesia Procedure Notes (Signed)
Procedure Name: Intubation Date/Time: 06/06/2021 1:40 PM Performed by: Inda Coke, CRNA Pre-anesthesia Checklist: Patient identified, Emergency Drugs available, Suction available, Patient being monitored and Timeout performed Patient Re-evaluated:Patient Re-evaluated prior to induction Oxygen Delivery Method: Ambu bag Preoxygenation: Pre-oxygenation with 100% oxygen Induction Type: IV induction, Rapid sequence and Cricoid Pressure applied Ventilation: Mask ventilation without difficulty Laryngoscope Size: Glidescope and 4 Grade View: Grade I Tube type: Oral Tube size: 7.5 mm Number of attempts: 1 Airway Equipment and Method: Stylet, Video-laryngoscopy and Rigid stylet Placement Confirmation: ETT inserted through vocal cords under direct vision, breath sounds checked- equal and bilateral and CO2 detector Secured at: 22 cm Tube secured with: Tape Dental Injury: Teeth and Oropharynx as per pre-operative assessment

## 2021-06-06 NOTE — Progress Notes (Signed)
Patient extubated earlier this AM. Had stridor after extubation but improved somewhat with racemic epinephrine and O2 saturations were stable. Patient received racemic epinephrine x3 and continued to have stridor with increased work of breathing. Anesthesia called for reintubation. Pulmonary and critical care at bedside for support as well, appreciate assistance. Patient re-intubated by anesthesia around 1:40 PM. CXR ordered and respiratory culture ordered. Will discuss steroids for airway inflammation with attending MD. Restart fentanyl gtt for pain management. Patient already started on precedex gtt this AM for alcohol withdrawal.   Norm Parcel, [facility] 06/06/2021, 1:47 PM Please see Amion for pager number during day hours 7:00am-4:30pm
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
## 2021-06-06 NOTE — Progress Notes (Signed)
OT Cancellation Note  Patient Details Name: GIANNA CALEF MRN: [id number] DOB: 1971/04/09   Cancelled Treatment:    Reason Eval/Treat Not Completed: Medical issues which prohibited therapy.  RN requests OT hold this am due to pt with increased anxiety.  Will reattempt.  Nilsa Nutting., OTR/L Acute Rehabilitation Services Pager [phone] Office [phone]   Lucille Passy M 06/06/2021, 11:42 AM

## 2021-06-06 NOTE — Procedures (Signed)
Extubation Procedure Note  Patient Details:   Name: Melissa Harrington DOB: [birth year] MRN: [id number]   Airway Documentation:    Vent end date: 06/06/21 Vent end time: 0820   Evaluation  O2 sats: stable throughout Complications: No apparent complications Patient did tolerate procedure well. Bilateral Breath Sounds: Clear, Diminished   Patient extubated per MD order & placed on 5L [place]. Patient had positive cuff leak prior to extubation. Post extubation patient is able to speak & has good cough. Will continue to monitor.  Kathie Dike 06/06/2021, 8:27 AM
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
## 2021-06-06 NOTE — Progress Notes (Signed)
Tracheal aspirate collected and sent to lab. 

## 2021-06-07 ENCOUNTER — Inpatient Hospital Stay (HOSPITAL_COMMUNITY): Payer: 59

## 2021-06-07 LAB — TYPE AND SCREEN
ABO/RH(D): O POS
Antibody Screen: NEGATIVE
Unit division: 0
Unit division: 0
Unit division: 0
Unit division: 0
Unit division: 0
Unit division: 0

## 2021-06-07 LAB — BPAM RBC
Blood Product Expiration Date: 202208202359
Blood Product Expiration Date: 202208202359
Blood Product Expiration Date: 202208202359
Blood Product Expiration Date: 202208202359
Blood Product Expiration Date: 202208202359
Blood Product Expiration Date: 202208202359
ISSUE DATE / TIME: 202207190900
ISSUE DATE / TIME: 202207190900
ISSUE DATE / TIME: 202207191025
ISSUE DATE / TIME: 202207200743
Unit Type and Rh: 5100
Unit Type and Rh: 5100
Unit Type and Rh: 5100
Unit Type and Rh: 5100
Unit Type and Rh: 5100
Unit Type and Rh: 5100

## 2021-06-07 LAB — GLUCOSE, CAPILLARY
Glucose-Capillary: 204 mg/dL — ABNORMAL HIGH (ref 70–99)
Glucose-Capillary: 196 mg/dL — ABNORMAL HIGH (ref 70–99)
Glucose-Capillary: 192 mg/dL — ABNORMAL HIGH (ref 70–99)
Glucose-Capillary: 201 mg/dL — ABNORMAL HIGH (ref 70–99)
Glucose-Capillary: 252 mg/dL — ABNORMAL HIGH (ref 70–99)
Glucose-Capillary: 274 mg/dL — ABNORMAL HIGH (ref 70–99)

## 2021-06-07 LAB — POCT I-STAT 7, (LYTES, BLD GAS, ICA,H+H)
Acid-Base Excess: 4 mmol/L — ABNORMAL HIGH (ref 0.0–2.0)
Bicarbonate: 27.7 mmol/L (ref 20.0–28.0)
Calcium, Ion: 1.13 mmol/L — ABNORMAL LOW (ref 1.15–1.40)
HCT: 18 % — ABNORMAL LOW (ref 36.0–46.0)
Hemoglobin: 6.1 g/dL — CL (ref 12.0–15.0)
O2 Saturation: 99 %
Potassium: 3.3 mmol/L — ABNORMAL LOW (ref 3.5–5.1)
Sodium: 152 mmol/L — ABNORMAL HIGH (ref 135–145)
TCO2: 29 mmol/L (ref 22–32)
pCO2 arterial: 34.2 mmHg (ref 32.0–48.0)
pH, Arterial: 7.518 — ABNORMAL HIGH (ref 7.350–7.450)
pO2, Arterial: 113 mmHg — ABNORMAL HIGH (ref 83.0–108.0)

## 2021-06-07 LAB — CULTURE, BLOOD (ROUTINE X 2)
Culture: NO GROWTH
Culture: NO GROWTH
Special Requests: ADEQUATE

## 2021-06-07 LAB — CBC
HCT: 22.1 % — ABNORMAL LOW (ref 36.0–46.0)
Hemoglobin: 6.9 g/dL — CL (ref 12.0–15.0)
MCH: 30.5 pg (ref 26.0–34.0)
MCHC: 31.2 g/dL (ref 30.0–36.0)
MCV: 97.8 fL (ref 80.0–100.0)
Platelets: 242 10*3/uL (ref 150–400)
RBC: 2.26 MIL/uL — ABNORMAL LOW (ref 3.87–5.11)
RDW: 19.6 % — ABNORMAL HIGH (ref 11.5–15.5)
WBC: 5.8 10*3/uL (ref 4.0–10.5)
nRBC: 1.2 % — ABNORMAL HIGH (ref 0.0–0.2)

## 2021-06-07 LAB — BASIC METABOLIC PANEL
Anion gap: 8 (ref 5–15)
BUN: 19 mg/dL (ref 6–20)
CO2: 26 mmol/L (ref 22–32)
Calcium: 7.9 mg/dL — ABNORMAL LOW (ref 8.9–10.3)
Chloride: 115 mmol/L — ABNORMAL HIGH (ref 98–111)
Creatinine, Ser: 0.73 mg/dL (ref 0.44–1.00)
GFR, Estimated: >60 mL/min (ref >60)
Glucose, Bld: 221 mg/dL — ABNORMAL HIGH (ref 70–99)
Potassium: 3.2 mmol/L — ABNORMAL LOW (ref 3.5–5.1)
Sodium: 149 mmol/L — ABNORMAL HIGH (ref 135–145)

## 2021-06-07 LAB — MAGNESIUM: Magnesium: 2.4 mg/dL (ref 1.7–2.4)

## 2021-06-07 LAB — APTT
aPTT: 44 s — ABNORMAL HIGH (ref 24–36)
aPTT: 55 s — ABNORMAL HIGH (ref 24–36)
aPTT: 42 s — ABNORMAL HIGH (ref 24–36)
aPTT: 53 s — ABNORMAL HIGH (ref 24–36)
aPTT: 52 s — ABNORMAL HIGH (ref 24–36)

## 2021-06-07 LAB — PREPARE RBC (CROSSMATCH)

## 2021-06-07 MED ORDER — INSULIN DETEMIR 100 UNIT/ML ~~LOC~~ SOLN
15.0000 [IU] | Freq: Once | SUBCUTANEOUS | Status: AC
  Administered 2021-06-07: 15 [IU] via SUBCUTANEOUS
  Filled 2021-06-07: qty 0.15

## 2021-06-07 MED ORDER — FUROSEMIDE 10 MG/ML IJ SOLN
80.0000 mg | Freq: Once | INTRAMUSCULAR | Status: AC
  Administered 2021-06-07: 80 mg via INTRAVENOUS
  Filled 2021-06-07: qty 8

## 2021-06-07 MED ORDER — FREE WATER
300.0000 mL | Status: DC
  Administered 2021-06-07 – 2021-06-08 (×12): 300 mL

## 2021-06-07 MED ORDER — INSULIN DETEMIR 100 UNIT/ML ~~LOC~~ SOLN
20.0000 [IU] | Freq: Two times a day (BID) | SUBCUTANEOUS | Status: DC
  Administered 2021-06-07 – 2021-06-11 (×8): 20 [IU] via SUBCUTANEOUS
  Filled 2021-06-07 (×9): qty 0.2

## 2021-06-07 MED ORDER — SODIUM CHLORIDE 0.9% IV SOLUTION: Freq: Once | INTRAVENOUS | Status: AC

## 2021-06-07 MED ORDER — POTASSIUM CHLORIDE 10 MEQ/50ML IV SOLN
10.0000 meq | INTRAVENOUS | Status: DC
Start: 2021-06-07 — End: 2021-06-07
  Administered 2021-06-07: 10 meq via INTRAVENOUS
  Filled 2021-06-07: qty 50

## 2021-06-07 MED ORDER — DEXAMETHASONE SODIUM PHOSPHATE 4 MG/ML IJ SOLN
4.0000 mg | Freq: Four times a day (QID) | INTRAMUSCULAR | Status: AC
  Administered 2021-06-07 – 2021-06-08 (×4): 4 mg via INTRAVENOUS
  Filled 2021-06-07 (×4): qty 1

## 2021-06-07 MED ORDER — POTASSIUM CHLORIDE 20 MEQ PO PACK
40.0000 meq | PACK | ORAL | Status: AC
Start: 2021-06-07 — End: 2021-06-07
  Administered 2021-06-07 (×3): 40 meq
  Filled 2021-06-07 (×3): qty 2

## 2021-06-07 MED ORDER — ADULT MULTIVITAMIN W/MINERALS CH
1.0000 | ORAL_TABLET | Freq: Every day | ORAL | Status: DC
  Administered 2021-06-08 – 2021-06-14 (×7): 1
  Filled 2021-06-07 (×7): qty 1

## 2021-06-07 MED ORDER — FUROSEMIDE 10 MG/ML IJ SOLN
INTRAMUSCULAR | Status: AC
  Filled 2021-06-07: qty 8
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
## 2021-06-07 NOTE — Procedures (Signed)
Cortrak  Person Inserting Tube:  Albirda Shiel, RD Tube Type:  Cortrak - 43 inches Tube Size:  10 Tube Location:  Right nare Initial Placement:  Stomach Secured by: Bridle Technique Used to Measure Tube Placement:  Marking at nare/corner of mouth Cortrak Secured At:  78 cm  Cortrak Tube Team Note:  Consult received to place a Cortrak feeding tube.   X-ray is required, abdominal x-ray has been ordered by the Cortrak team. Please confirm tube placement before using the Cortrak tube.   If the tube becomes dislodged please keep the tube and contact the Cortrak team at [email or web address] ([email or web address]) for replacement.  If after hours and replacement cannot be delayed, place a NG tube and confirm placement with an abdominal x-ray.   Taiquan Campanaro MS, RD, LDN, CNSC Clinical Nutrition Pager listed in AMION    
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
## 2021-06-08 LAB — TYPE AND SCREEN
ABO/RH(D): O POS
Antibody Screen: NEGATIVE
Unit division: 0

## 2021-06-08 LAB — GLUCOSE, CAPILLARY
Glucose-Capillary: 269 mg/dL — ABNORMAL HIGH (ref 70–99)
Glucose-Capillary: 262 mg/dL — ABNORMAL HIGH (ref 70–99)
Glucose-Capillary: 262 mg/dL — ABNORMAL HIGH (ref 70–99)
Glucose-Capillary: 156 mg/dL — ABNORMAL HIGH (ref 70–99)
Glucose-Capillary: 93 mg/dL (ref 70–99)
Glucose-Capillary: 152 mg/dL — ABNORMAL HIGH (ref 70–99)

## 2021-06-08 LAB — BASIC METABOLIC PANEL
Anion gap: 8 (ref 5–15)
BUN: 27 mg/dL — ABNORMAL HIGH (ref 6–20)
CO2: 26 mmol/L (ref 22–32)
Calcium: 7.8 mg/dL — ABNORMAL LOW (ref 8.9–10.3)
Chloride: 108 mmol/L (ref 98–111)
Creatinine, Ser: 0.48 mg/dL (ref 0.44–1.00)
GFR, Estimated: >60 mL/min (ref >60)
Glucose, Bld: 283 mg/dL — ABNORMAL HIGH (ref 70–99)
Potassium: 3.9 mmol/L (ref 3.5–5.1)
Sodium: 142 mmol/L (ref 135–145)

## 2021-06-08 LAB — BPAM RBC
Blood Product Expiration Date: 202208222359
ISSUE DATE / TIME: 202207221114
Unit Type and Rh: 5100

## 2021-06-08 LAB — CBC
HCT: 25.6 % — ABNORMAL LOW (ref 36.0–46.0)
Hemoglobin: 7.8 g/dL — ABNORMAL LOW (ref 12.0–15.0)
MCH: 29.4 pg (ref 26.0–34.0)
MCHC: 30.5 g/dL (ref 30.0–36.0)
MCV: 96.6 fL (ref 80.0–100.0)
Platelets: 282 10*3/uL (ref 150–400)
RBC: 2.65 MIL/uL — ABNORMAL LOW (ref 3.87–5.11)
RDW: 17.9 % — ABNORMAL HIGH (ref 11.5–15.5)
WBC: 6.1 10*3/uL (ref 4.0–10.5)
nRBC: 0 % (ref 0.0–0.2)

## 2021-06-08 LAB — APTT: aPTT: 54 s — ABNORMAL HIGH (ref 24–36)

## 2021-06-08 MED ORDER — QUETIAPINE FUMARATE 200 MG PO TABS
200.0000 mg | ORAL_TABLET | Freq: Three times a day (TID) | ORAL | Status: DC
  Filled 2021-06-08: qty 1

## 2021-06-08 MED ORDER — BETHANECHOL CHLORIDE 10 MG PO TABS
10.0000 mg | ORAL_TABLET | Freq: Three times a day (TID) | ORAL | Status: DC
  Administered 2021-06-08 – 2021-06-14 (×19): 10 mg
  Filled 2021-06-08 (×19): qty 1

## 2021-06-08 MED ORDER — QUETIAPINE FUMARATE 100 MG PO TABS
200.0000 mg | ORAL_TABLET | Freq: Three times a day (TID) | ORAL | Status: DC
  Administered 2021-06-08 – 2021-06-14 (×20): 200 mg
  Filled 2021-06-08 (×3): qty 1
  Filled 2021-06-08: qty 2
  Filled 2021-06-08 (×7): qty 1
  Filled 2021-06-08: qty 2
  Filled 2021-06-08 (×3): qty 1
  Filled 2021-06-08: qty 2
  Filled 2021-06-08: qty 1
  Filled 2021-06-08: qty 2
  Filled 2021-06-08: qty 1

## 2021-06-08 MED ORDER — BETHANECHOL CHLORIDE 10 MG PO TABS: 10.0000 mg | ORAL_TABLET | Freq: Three times a day (TID) | ORAL | Status: DC

## 2021-06-08 MED ORDER — FUROSEMIDE 10 MG/ML IJ SOLN
40.0000 mg | Freq: Two times a day (BID) | INTRAMUSCULAR | Status: DC
  Administered 2021-06-08 – 2021-06-11 (×6): 40 mg via INTRAVENOUS
  Filled 2021-06-08 (×6): qty 4

## 2021-06-08 MED ORDER — FUROSEMIDE 10 MG/ML IJ SOLN
INTRAMUSCULAR | Status: AC
  Administered 2021-06-08: 40 mg via INTRAVENOUS
  Filled 2021-06-08: qty 4

## 2021-06-08 MED ORDER — ORAL CARE MOUTH RINSE
15.0000 mL | Freq: Two times a day (BID) | OROMUCOSAL | Status: DC
  Administered 2021-06-09 – 2021-06-21 (×21): 15 mL via OROMUCOSAL

## 2021-06-08 NOTE — Procedures (Signed)
Extubation Procedure Note  Patient Details:   Name: Melissa Harrington DOB: 1970-12-10 MRN: [id number]   Airway Documentation:    Vent end date: 06/08/21 Vent end time: 1140   Evaluation  O2 sats: stable throughout Complications: No apparent complications Patient did tolerate procedure well. Bilateral Breath Sounds: Diminished  Pateint able to speak Yes  [name] 06/08/2021, 11:42 AM

## 2021-06-08 NOTE — Progress Notes (Signed)
   06/08/21 0840  Airway 7.5 mm  Placement Date/Time: 06/06/21 1338   Placed By: Anesthesiologist;CRNA  Airway Device: Endotracheal Tube  ETT Types: Oral  Size (mm): 7.5 mm  Cuffed: Cuffed  Insertion attempts: 1  Airway Equipment: Stylet;Video Laryngoscope  Placement Confirmation: Sharma Covert...  Secured at (cm) 25 cm  Measured From Lips  Secured Location Right  Secured By Actuary Repositioned Yes  Prone position No  Cuff Pressure (cm H2O) Green OR 18-26 CmH2O  Site Condition Dry  Adult Ventilator Settings  Vent Type Servo i  Humidity HME  Vent Mode CPAP;PSV  FiO2 (%) 40 %  Pressure Support 5 cmH20  PEEP 5 cmH20  Adult Ventilator Measurements  Peak Airway Pressure 11 L/min  Mean Airway Pressure 7 cmH20  Resp Rate Spontaneous 26 br/min  Resp Rate Total 26 br/min  Spont TV 616 mL  Measured Ve 13.6 mL  Auto PEEP 0 cmH20  Total PEEP 5 cmH20  SpO2 99 %  Adult Ventilator Alarms  Alarms On Y  Ve High Alarm 20 L/min  Ve Low Alarm 6 L/min  Resp Rate High Alarm 38 br/min  Resp Rate Low Alarm 12  PEEP Low Alarm 3 cmH2O  Press High Alarm 45 cmH2O  T Apnea 20 sec(s)  Daily Weaning Assessment  Daily Assessment of Readiness to Wean Wean protocol criteria met (SBT performed)  SBT Method CPAP 5 cm H20 and PS 5 cm H20  Weaning Start Time 0840  Patient response Passed (Tolerated well)  Breath Sounds  Bilateral Breath Sounds Diminished
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
## 2021-06-09 LAB — CBC
HCT: 25.9 % — ABNORMAL LOW (ref 36.0–46.0)
Hemoglobin: 8 g/dL — ABNORMAL LOW (ref 12.0–15.0)
MCH: 30 pg (ref 26.0–34.0)
MCHC: 30.9 g/dL (ref 30.0–36.0)
MCV: 97 fL (ref 80.0–100.0)
Platelets: 351 10*3/uL (ref 150–400)
RBC: 2.67 MIL/uL — ABNORMAL LOW (ref 3.87–5.11)
RDW: 18 % — ABNORMAL HIGH (ref 11.5–15.5)
WBC: 6.2 10*3/uL (ref 4.0–10.5)
nRBC: 0.3 % — ABNORMAL HIGH (ref 0.0–0.2)

## 2021-06-09 LAB — GLUCOSE, CAPILLARY
Glucose-Capillary: 103 mg/dL — ABNORMAL HIGH (ref 70–99)
Glucose-Capillary: 176 mg/dL — ABNORMAL HIGH (ref 70–99)
Glucose-Capillary: 193 mg/dL — ABNORMAL HIGH (ref 70–99)
Glucose-Capillary: 168 mg/dL — ABNORMAL HIGH (ref 70–99)
Glucose-Capillary: 160 mg/dL — ABNORMAL HIGH (ref 70–99)
Glucose-Capillary: 172 mg/dL — ABNORMAL HIGH (ref 70–99)

## 2021-06-09 LAB — APTT
aPTT: 45 s — ABNORMAL HIGH (ref 24–36)
aPTT: 47 s — ABNORMAL HIGH (ref 24–36)
aPTT: 29 s (ref 24–36)
aPTT: 46 s — ABNORMAL HIGH (ref 24–36)
aPTT: 43 s — ABNORMAL HIGH (ref 24–36)

## 2021-06-09 LAB — CULTURE, RESPIRATORY W GRAM STAIN: Culture: NORMAL

## 2021-06-09 MED ORDER — ARGATROBAN 50 MG/50ML IV SOLN
2.4000 ug/kg/min | INTRAVENOUS | Status: DC
  Administered 2021-06-09: 1.65 ug/kg/min via INTRAVENOUS
  Administered 2021-06-10: 2.4 ug/kg/min via INTRAVENOUS
  Filled 2021-06-09 (×3): qty 50

## 2021-06-09 MED ORDER — METOPROLOL TARTRATE 5 MG/5ML IV SOLN
5.0000 mg | Freq: Four times a day (QID) | INTRAVENOUS | Status: DC | PRN
  Administered 2021-06-10 – 2021-06-13 (×5): 5 mg via INTRAVENOUS
  Filled 2021-06-09 (×5): qty 5

## 2021-06-09 MED ORDER — ARGATROBAN 50 MG/50ML IV SOLN
1.5000 ug/kg/min | INTRAVENOUS | Status: DC
  Administered 2021-06-09 (×3): 1.5 ug/kg/min via INTRAVENOUS
  Filled 2021-06-09: qty 50
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
## 2021-06-10 LAB — CBC
HCT: 31.2 % — ABNORMAL LOW (ref 36.0–46.0)
Hemoglobin: 9.9 g/dL — ABNORMAL LOW (ref 12.0–15.0)
MCH: 29.8 pg (ref 26.0–34.0)
MCHC: 31.7 g/dL (ref 30.0–36.0)
MCV: 94 fL (ref 80.0–100.0)
Platelets: 429 10*3/uL — ABNORMAL HIGH (ref 150–400)
RBC: 3.32 MIL/uL — ABNORMAL LOW (ref 3.87–5.11)
RDW: 18.4 % — ABNORMAL HIGH (ref 11.5–15.5)
WBC: 6.5 10*3/uL (ref 4.0–10.5)
nRBC: 0.5 % — ABNORMAL HIGH (ref 0.0–0.2)

## 2021-06-10 LAB — GLUCOSE, CAPILLARY
Glucose-Capillary: 202 mg/dL — ABNORMAL HIGH (ref 70–99)
Glucose-Capillary: 192 mg/dL — ABNORMAL HIGH (ref 70–99)
Glucose-Capillary: 216 mg/dL — ABNORMAL HIGH (ref 70–99)
Glucose-Capillary: 179 mg/dL — ABNORMAL HIGH (ref 70–99)
Glucose-Capillary: 201 mg/dL — ABNORMAL HIGH (ref 70–99)
Glucose-Capillary: 152 mg/dL — ABNORMAL HIGH (ref 70–99)

## 2021-06-10 LAB — CULTURE, BLOOD (ROUTINE X 2)
Culture: NO GROWTH
Culture: NO GROWTH
Special Requests: ADEQUATE
Special Requests: ADEQUATE

## 2021-06-10 LAB — BASIC METABOLIC PANEL
Anion gap: 12 (ref 5–15)
BUN: 32 mg/dL — ABNORMAL HIGH (ref 6–20)
CO2: 23 mmol/L (ref 22–32)
Calcium: 8.6 mg/dL — ABNORMAL LOW (ref 8.9–10.3)
Chloride: 113 mmol/L — ABNORMAL HIGH (ref 98–111)
Creatinine, Ser: 0.65 mg/dL (ref 0.44–1.00)
GFR, Estimated: >60 mL/min (ref >60)
Glucose, Bld: 197 mg/dL — ABNORMAL HIGH (ref 70–99)
Potassium: 3 mmol/L — ABNORMAL LOW (ref 3.5–5.1)
Sodium: 148 mmol/L — ABNORMAL HIGH (ref 135–145)

## 2021-06-10 LAB — APTT
aPTT: 43 s — ABNORMAL HIGH (ref 24–36)
aPTT: 49 s — ABNORMAL HIGH (ref 24–36)
aPTT: 44 s — ABNORMAL HIGH (ref 24–36)

## 2021-06-10 LAB — MAGNESIUM: Magnesium: 2.3 mg/dL (ref 1.7–2.4)

## 2021-06-10 MED ORDER — METOLAZONE 5 MG PO TABS
10.0000 mg | ORAL_TABLET | Freq: Once | ORAL | Status: AC
  Administered 2021-06-10: 10 mg
  Filled 2021-06-10: qty 2

## 2021-06-10 MED ORDER — METOLAZONE 5 MG PO TABS
10.0000 mg | ORAL_TABLET | Freq: Once | ORAL | Status: DC
  Filled 2021-06-10: qty 2

## 2021-06-10 MED ORDER — SODIUM CHLORIDE 0.9 % IV SOLN
0.1800 mg/kg/h | INTRAVENOUS | Status: DC
  Administered 2021-06-10: 0.15 mg/kg/h via INTRAVENOUS
  Filled 2021-06-10: qty 250

## 2021-06-10 MED ORDER — POTASSIUM CHLORIDE 20 MEQ PO PACK
40.0000 meq | PACK | ORAL | Status: AC
  Administered 2021-06-10 (×3): 40 meq
  Filled 2021-06-10 (×3): qty 2

## 2021-06-10 MED ORDER — CHLORDIAZEPOXIDE HCL 5 MG PO CAPS
10.0000 mg | ORAL_CAPSULE | Freq: Three times a day (TID) | ORAL | Status: AC
  Administered 2021-06-10 – 2021-06-12 (×9): 10 mg
  Filled 2021-06-10 (×9): qty 2

## 2021-06-10 MED ORDER — IPRATROPIUM-ALBUTEROL 0.5-2.5 (3) MG/3ML IN SOLN
3.0000 mL | Freq: Two times a day (BID) | RESPIRATORY_TRACT | Status: DC
  Administered 2021-06-11 – 2021-06-15 (×9): 3 mL via RESPIRATORY_TRACT
  Filled 2021-06-10 (×9): qty 3

## 2021-06-10 MED ORDER — SODIUM CHLORIDE 0.9 % IV SOLN
0.2300 mg/kg/h | INTRAVENOUS | Status: DC
  Administered 2021-06-10 – 2021-06-11 (×2): 0.21 mg/kg/h via INTRAVENOUS
  Administered 2021-06-11 – 2021-06-13 (×4): 0.23 mg/kg/h via INTRAVENOUS
  Filled 2021-06-10 (×6): qty 250

## 2021-06-10 MED ORDER — IPRATROPIUM-ALBUTEROL 0.5-2.5 (3) MG/3ML IN SOLN
3.0000 mL | RESPIRATORY_TRACT | Status: DC
  Administered 2021-06-10 (×2): 3 mL via RESPIRATORY_TRACT
  Filled 2021-06-10 (×3): qty 3
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
## 2021-06-10 NOTE — Progress Notes (Addendum)
ANTICOAGULATION CONSULT NOTE - Follow Up Consult  Pharmacy Consult for argatroban Indication: pulmonary embolus  Labs: Recent Labs    06/07/21 0610 06/07/21 0613 06/07/21 0817 06/07/21 1200 06/08/21 0526 06/08/21 0542 06/09/21 0451 06/09/21 0659 06/09/21 1253 06/09/21 1712 06/09/21 2305  HGB 6.9*  --  6.1*  --  7.8*  --  8.0*  --   --   --   --   HCT 22.1*  --  18.0*  --  25.6*  --  25.9*  --   --   --   --   PLT 242  --   --   --  282  --  351  --   --   --   --   APTT  --    < >  --    < > 54*  --  45*   < > 29 46* 43*  CREATININE 0.73  --   --   --   --  0.48  --   --   --   --   --    < > = values in this interval not displayed.    Assessment: 50yo female subtherapeutic on argatroban after rate change; no gtt issues or signs of bleeding per RN.  Goal of Therapy:  aPTT 50-70 seconds   Plan:  Will increase argatroban gtt by 20% to 2 mcg/kg/min and check PTT in 4 hours.    Wynona Neat, PharmD, BCPS  06/10/2021,12:50 AM    Addendum:  Followup PTT remains at 43, argatroban infusion rate was changed a little late but would have expected some increase to PTT.  Will increase argatroban gtt by 20% to 2.4 mcg/kg/min and check PTT in 4 hours.   VB   6:00 AM
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
## 2021-06-11 LAB — CBC
HCT: 34.4 % — ABNORMAL LOW (ref 36.0–46.0)
Hemoglobin: 10.4 g/dL — ABNORMAL LOW (ref 12.0–15.0)
MCH: 29.2 pg (ref 26.0–34.0)
MCHC: 30.2 g/dL (ref 30.0–36.0)
MCV: 96.6 fL (ref 80.0–100.0)
Platelets: 487 10*3/uL — ABNORMAL HIGH (ref 150–400)
RBC: 3.56 MIL/uL — ABNORMAL LOW (ref 3.87–5.11)
RDW: 18.8 % — ABNORMAL HIGH (ref 11.5–15.5)
WBC: 7.5 10*3/uL (ref 4.0–10.5)
nRBC: 0.3 % — ABNORMAL HIGH (ref 0.0–0.2)

## 2021-06-11 LAB — URINALYSIS, ROUTINE W REFLEX MICROSCOPIC
Bilirubin Urine: NEGATIVE
Glucose, UA: NEGATIVE mg/dL
Hgb urine dipstick: NEGATIVE
Ketones, ur: NEGATIVE mg/dL
Leukocytes,Ua: NEGATIVE
Nitrite: NEGATIVE
Protein, ur: NEGATIVE mg/dL
Specific Gravity, Urine: 1.017 (ref 1.005–1.030)
pH: 6 (ref 5.0–8.0)

## 2021-06-11 LAB — BASIC METABOLIC PANEL
Anion gap: 9 (ref 5–15)
BUN: 31 mg/dL — ABNORMAL HIGH (ref 6–20)
CO2: 25 mmol/L (ref 22–32)
Calcium: 9.2 mg/dL (ref 8.9–10.3)
Chloride: 110 mmol/L (ref 98–111)
Creatinine, Ser: 0.71 mg/dL (ref 0.44–1.00)
GFR, Estimated: >60 mL/min (ref >60)
Glucose, Bld: 231 mg/dL — ABNORMAL HIGH (ref 70–99)
Potassium: 3.8 mmol/L (ref 3.5–5.1)
Sodium: 144 mmol/L (ref 135–145)

## 2021-06-11 LAB — APTT
aPTT: 52 s — ABNORMAL HIGH (ref 24–36)
aPTT: 50 s — ABNORMAL HIGH (ref 24–36)

## 2021-06-11 LAB — GLUCOSE, CAPILLARY
Glucose-Capillary: 148 mg/dL — ABNORMAL HIGH (ref 70–99)
Glucose-Capillary: 227 mg/dL — ABNORMAL HIGH (ref 70–99)
Glucose-Capillary: 223 mg/dL — ABNORMAL HIGH (ref 70–99)
Glucose-Capillary: 212 mg/dL — ABNORMAL HIGH (ref 70–99)
Glucose-Capillary: 251 mg/dL — ABNORMAL HIGH (ref 70–99)
Glucose-Capillary: 195 mg/dL — ABNORMAL HIGH (ref 70–99)

## 2021-06-11 MED ORDER — METOPROLOL TARTRATE 25 MG PO TABS: 12.5000 mg | ORAL_TABLET | Freq: Two times a day (BID) | ORAL | Status: DC

## 2021-06-11 MED ORDER — INSULIN DETEMIR 100 UNIT/ML ~~LOC~~ SOLN
24.0000 [IU] | Freq: Two times a day (BID) | SUBCUTANEOUS | Status: DC
  Administered 2021-06-11 – 2021-06-16 (×10): 24 [IU] via SUBCUTANEOUS
  Filled 2021-06-11 (×11): qty 0.24

## 2021-06-11 MED ORDER — METOPROLOL TARTRATE 12.5 MG HALF TABLET
12.5000 mg | ORAL_TABLET | Freq: Two times a day (BID) | ORAL | Status: DC
  Administered 2021-06-11 – 2021-06-14 (×8): 12.5 mg
  Filled 2021-06-11 (×9): qty 1

## 2021-06-11 MED ORDER — WHITE PETROLATUM EX OINT
TOPICAL_OINTMENT | CUTANEOUS | Status: AC
  Filled 2021-06-11: qty 28.35

## 2021-06-11 MED ORDER — CHLORDIAZEPOXIDE HCL 5 MG PO CAPS
5.0000 mg | ORAL_CAPSULE | Freq: Three times a day (TID) | ORAL | Status: DC
  Administered 2021-06-13 – 2021-06-14 (×6): 5 mg
  Filled 2021-06-11 (×6): qty 1
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
## 2021-06-11 NOTE — Progress Notes (Signed)
Inpatient Rehab Admissions Coordinator Note:   Per therapy recommendations, pt was screened for CIR candidacy by Clemens Catholic, [place] CCC-SLP. At this time, Pt. Requiring maxi-sky for transfers and has not attempted OOB. I do not believe she could tolerate intensity of CIR at this time; however, Pt. Is likely progress to being an appropriate candidate in the next few days-week. CIR will follow for progress and participation and place consult order if Pt. Becomes an appropriate candidate. Please contact me with questions.    Clemens Catholic, [place], [place] Admissions Coordinator  [phone] ([place]) [phone] (office)
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
## 2021-06-11 NOTE — TOC Progression Note (Signed)
Transition of Care [facility]) - Progression Note    Patient Details  Name: SAMARRA TOGUCHI MRN: [id number] Date of Birth: 11-10-1971  Transition of [facility]) CM/SW Contact  Ella Bodo, RN Phone Number: 06/11/2021, 4:07 PM  Clinical Narrative:   Approached by patient's daughter, [name], requesting assistance on disability application for "widower's disability".  Trauma case manager not familiar with these forms; sent message to manager of financial counseling asking for assistance. Will follow up with [name] when information is available.       Barriers to Discharge: Continued Medical Work up  Expected Discharge Plan and Services     Discharge Planning Services: CM Consult   Living arrangements for the past 2 months: Single Family Home                                       Social Determinants of Health (SDOH) Interventions    Readmission Risk Interventions No flowsheet data found.  Reinaldo Raddle, RN, BSN  Trauma/Neuro ICU Case Manager [phone]
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
## 2021-06-11 NOTE — Progress Notes (Signed)
CPT held this round. PT working with patient.

## 2021-06-11 NOTE — Progress Notes (Signed)
ANTICOAGULATION CONSULT NOTE - Follow Up Consult  Pharmacy Consult for bivalirudin Indication: pulmonary embolus  Labs: Recent Labs    06/08/21 0542 06/09/21 0451 06/09/21 0659 06/10/21 0438 06/10/21 1413 06/10/21 1914 06/11/21 0151  HGB  --  8.0*  --  9.9*  --   --  10.4*  HCT  --  25.9*  --  31.2*  --   --  34.4*  PLT  --  351  --  429*  --   --  487*  APTT  --  45*   < > 43* 49* 44* 52*  CREATININE 0.48  --   --  0.65  --   --   --    < > = values in this interval not displayed.    Assessment/Plan:  50yo female therapeutic on bivalirudin after rate change. Will continue infusion at current rate of 0.21 mg/kg/hr and confirm stable with additional PTT.   Wynona Neat, PharmD, BCPS  06/11/2021,2:56 AM
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
## 2021-06-12 ENCOUNTER — Inpatient Hospital Stay (HOSPITAL_COMMUNITY): Payer: 59

## 2021-06-12 LAB — CBC
HCT: 35.9 % — ABNORMAL LOW (ref 36.0–46.0)
Hemoglobin: 11.2 g/dL — ABNORMAL LOW (ref 12.0–15.0)
MCH: 30.1 pg (ref 26.0–34.0)
MCHC: 31.2 g/dL (ref 30.0–36.0)
MCV: 96.5 fL (ref 80.0–100.0)
Platelets: 473 10*3/uL — ABNORMAL HIGH (ref 150–400)
RBC: 3.72 MIL/uL — ABNORMAL LOW (ref 3.87–5.11)
RDW: 18.3 % — ABNORMAL HIGH (ref 11.5–15.5)
WBC: 8.7 10*3/uL (ref 4.0–10.5)
nRBC: 0 % (ref 0.0–0.2)

## 2021-06-12 LAB — GLUCOSE, CAPILLARY
Glucose-Capillary: 136 mg/dL — ABNORMAL HIGH (ref 70–99)
Glucose-Capillary: 143 mg/dL — ABNORMAL HIGH (ref 70–99)
Glucose-Capillary: 175 mg/dL — ABNORMAL HIGH (ref 70–99)
Glucose-Capillary: 188 mg/dL — ABNORMAL HIGH (ref 70–99)
Glucose-Capillary: 201 mg/dL — ABNORMAL HIGH (ref 70–99)
Glucose-Capillary: 259 mg/dL — ABNORMAL HIGH (ref 70–99)

## 2021-06-12 LAB — APTT: aPTT: 57 s — ABNORMAL HIGH (ref 24–36)

## 2021-06-12 MED ORDER — WITCH HAZEL-GLYCERIN EX PADS
MEDICATED_PAD | CUTANEOUS | Status: DC | PRN
  Filled 2021-06-12: qty 100

## 2021-06-12 MED ORDER — PIVOT 1.5 CAL PO LIQD: 1000.0000 mL | ORAL | Status: DC
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
## 2021-06-12 NOTE — TOC Progression Note (Signed)
Transition of Care [facility]) - Progression Note    Patient Details  Name: Melissa Harrington MRN: [id number] Date of Birth: 01/13/1971  Transition of Care [facility]) CM/SW Contact  Oren Section Cleta Alberts, RN Phone Number: 06/12/2021, 12:00pm  Clinical Narrative:   Received message from Shanon Rosser with financial counseling that she will be making a referral to the [facility] to assist patient with disability application.      Barriers to Discharge: Continued Medical Work up  Expected Discharge Plan and Services     Discharge Planning Services: CM Consult   Living arrangements for the past 2 months: Single Family Home                                       Social Determinants of Health (SDOH) Interventions    Readmission Risk Interventions No flowsheet data found.  Reinaldo Raddle, RN, BSN  Trauma/Neuro ICU Case Manager [phone]
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
## 2021-06-13 DIAGNOSIS — L8990 Pressure ulcer of unspecified site, unspecified stage: Secondary | ICD-10-CM | POA: Insufficient documentation

## 2021-06-13 LAB — GLUCOSE, CAPILLARY
Glucose-Capillary: 122 mg/dL — ABNORMAL HIGH (ref 70–99)
Glucose-Capillary: 126 mg/dL — ABNORMAL HIGH (ref 70–99)
Glucose-Capillary: 151 mg/dL — ABNORMAL HIGH (ref 70–99)
Glucose-Capillary: 161 mg/dL — ABNORMAL HIGH (ref 70–99)
Glucose-Capillary: 163 mg/dL — ABNORMAL HIGH (ref 70–99)
Glucose-Capillary: 197 mg/dL — ABNORMAL HIGH (ref 70–99)

## 2021-06-13 LAB — APTT: aPTT: 55 s — ABNORMAL HIGH (ref 24–36)

## 2021-06-13 MED ORDER — PIVOT 1.5 CAL PO LIQD
1000.0000 mL | ORAL | Status: DC
  Administered 2021-06-13 – 2021-06-14 (×3): 1000 mL
  Filled 2021-06-13 (×4): qty 1000

## 2021-06-13 MED ORDER — APIXABAN 5 MG PO TABS
5.0000 mg | ORAL_TABLET | Freq: Two times a day (BID) | ORAL | Status: DC
  Administered 2021-06-13 – 2021-06-14 (×4): 5 mg
  Filled 2021-06-13 (×4): qty 1
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
## 2021-06-13 NOTE — Progress Notes (Signed)
Pt transferred to 4NP04. RN and NT at bedside. Daughter, [name], called and notified of new room.
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
## 2021-06-13 NOTE — Progress Notes (Signed)
Inpatient Rehab Admissions Coordinator:   Met with pt and her 3 children at bedside during therapy session.  Weightbearing status updated for LLE and LUE today, allowing WBAT for transfers only LLE and WBAT through L elbow.  Pt able to complete squat pivot with +2 assist from therapists and +1 for maintaining NWB on RLE.  Reviewed recommendations for CIR with pt and her children, and explained expectations of 3 hrs/day (PT/OT/SLP), and ELOS around 4 weeks.  I discussed likely need for w/c level goals and definite need for 24/7 physical assist available to pt at discharge from CIR.  I let them know that she showed excellent progress today, but is not quite ready for 3 hrs/day of therapy, likely sometime early/mid next week.  I will need insurance authorization for CIR and will start this likely next week as well.  I asked pt's family to start putting together a list of people who will be able to provide care for patient at discharge.  I will not be working tomorrow, but will f/u with them on Monday to give them time to pull together some help.   Shann Medal, PT, DPT Admissions Coordinator [phone] 06/13/21  1:08 PM
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
## 2021-06-13 NOTE — Plan of Care (Signed)
  Problem: Activity: Goal: Risk for activity intolerance will decrease Outcome: Progressing   Problem: Nutrition: Goal: Adequate nutrition will be maintained Outcome: Progressing   
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
## 2021-06-14 ENCOUNTER — Inpatient Hospital Stay (HOSPITAL_COMMUNITY): Payer: 59

## 2021-06-14 LAB — GLUCOSE, CAPILLARY
Glucose-Capillary: 139 mg/dL — ABNORMAL HIGH (ref 70–99)
Glucose-Capillary: 166 mg/dL — ABNORMAL HIGH (ref 70–99)
Glucose-Capillary: 212 mg/dL — ABNORMAL HIGH (ref 70–99)
Glucose-Capillary: 175 mg/dL — ABNORMAL HIGH (ref 70–99)
Glucose-Capillary: 132 mg/dL — ABNORMAL HIGH (ref 70–99)

## 2021-06-14 LAB — BASIC METABOLIC PANEL
Anion gap: 13 (ref 5–15)
BUN: 34 mg/dL — ABNORMAL HIGH (ref 6–20)
CO2: 24 mmol/L (ref 22–32)
Calcium: 8.7 mg/dL — ABNORMAL LOW (ref 8.9–10.3)
Chloride: 106 mmol/L (ref 98–111)
Creatinine, Ser: 0.66 mg/dL (ref 0.44–1.00)
GFR, Estimated: >60 mL/min (ref >60)
Glucose, Bld: 127 mg/dL — ABNORMAL HIGH (ref 70–99)
Potassium: 2.9 mmol/L — ABNORMAL LOW (ref 3.5–5.1)
Sodium: 143 mmol/L (ref 135–145)

## 2021-06-14 LAB — CBC
HCT: 33.2 % — ABNORMAL LOW (ref 36.0–46.0)
Hemoglobin: 10 g/dL — ABNORMAL LOW (ref 12.0–15.0)
MCH: 29.3 pg (ref 26.0–34.0)
MCHC: 30.1 g/dL (ref 30.0–36.0)
MCV: 97.4 fL (ref 80.0–100.0)
Platelets: 435 10*3/uL — ABNORMAL HIGH (ref 150–400)
RBC: 3.41 MIL/uL — ABNORMAL LOW (ref 3.87–5.11)
RDW: 17.9 % — ABNORMAL HIGH (ref 11.5–15.5)
WBC: 7.5 10*3/uL (ref 4.0–10.5)
nRBC: 0 % (ref 0.0–0.2)

## 2021-06-14 MED ORDER — POTASSIUM CHLORIDE 20 MEQ PO PACK
60.0000 meq | PACK | Freq: Two times a day (BID) | ORAL | Status: AC
  Administered 2021-06-14 (×2): 60 meq
  Filled 2021-06-14 (×2): qty 3

## 2021-06-14 MED ORDER — WHITE PETROLATUM EX OINT
TOPICAL_OINTMENT | CUTANEOUS | Status: AC
  Administered 2021-06-14: 0.2
  Filled 2021-06-14: qty 28.35

## 2021-06-14 MED ORDER — POTASSIUM CHLORIDE 10 MEQ/100ML IV SOLN
10.0000 meq | INTRAVENOUS | Status: DC
Start: 2021-06-14 — End: 2021-06-14
  Administered 2021-06-14: 10 meq via INTRAVENOUS
  Filled 2021-06-14 (×6): qty 100

## 2021-06-14 NOTE — Progress Notes (Addendum)
Inpatient Rehabilitation Admissions Coordinator    I met at bedside with patient , daughter and Aunt. We will follow up next week to pursue insurance approval and possible CIR admit.   Danne Baxter, RN, MSN Rehab Admissions Coordinator [phone] 06/14/2021 10:58 AM

## 2021-06-14 NOTE — Progress Notes (Signed)
LATE ENTRY FROM 06/12/21  MBS and documentation completed by Jannette Spanner, MA CCC/SLP Note populated by [facility] Barium Swallow Progress Note  Patient Details  Name: Melissa Harrington MRN: [id number] Date of Birth: 1970/11/24  Today's Date: 06/14/2021  Modified Barium Swallow completed.  Full report located under Chart Review in the Imaging Section.  Brief recommendations include the following:  Clinical Impression   :  Pt presents with a mild oral and mild to moderate pharyngeal dysphagia likely multifactorial in setting of recent intubation and pt deconditioning. Pt alert, able to follow commands with repetition, very cooperative throughout study. Orally pt with delayed AP transit and mild oral residuals. Pharyngeal defictis c/b inconsistent epiglottic deflection (partial deflection with smaller bolus sizes noted) and resulted in decreased laryngeal vestibule closure. Pt with reduced base of tongue retraction, reduced pharyngeal stripping wave, and diminished sensation. This allowed for laryngeal penetration and trace tracheal aspiration, silent with thins via tsp: (PAS-8), intermittent laryngeal penetration of nectar thick and honey thick liquids (PAS: 1,3, 5), cues for throat clear or cough expelled penetrates from vocal cords. Mild vallecular residuals and pyriform sinuses noted though decreased with second swallows.  Recommend nectar (mildlly thick liquids) and dysphagia 3 (mechanical soft) consistencies with meds in puree (crush larger). Anticipate with more time, pt will be great candidate for diet advancement. Will continue to closley follow.
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
## 2021-06-14 NOTE — Plan of Care (Signed)
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
## 2021-06-15 LAB — GLUCOSE, CAPILLARY
Glucose-Capillary: 121 mg/dL — ABNORMAL HIGH (ref 70–99)
Glucose-Capillary: 146 mg/dL — ABNORMAL HIGH (ref 70–99)
Glucose-Capillary: 148 mg/dL — ABNORMAL HIGH (ref 70–99)
Glucose-Capillary: 159 mg/dL — ABNORMAL HIGH (ref 70–99)
Glucose-Capillary: 170 mg/dL — ABNORMAL HIGH (ref 70–99)
Glucose-Capillary: 85 mg/dL (ref 70–99)
Glucose-Capillary: 93 mg/dL (ref 70–99)

## 2021-06-15 LAB — BASIC METABOLIC PANEL
Anion gap: 10 (ref 5–15)
BUN: 30 mg/dL — ABNORMAL HIGH (ref 6–20)
CO2: 22 mmol/L (ref 22–32)
Calcium: 8.6 mg/dL — ABNORMAL LOW (ref 8.9–10.3)
Chloride: 108 mmol/L (ref 98–111)
Creatinine, Ser: 0.55 mg/dL (ref 0.44–1.00)
GFR, Estimated: >60 mL/min (ref >60)
Glucose, Bld: 198 mg/dL — ABNORMAL HIGH (ref 70–99)
Potassium: 3.7 mmol/L (ref 3.5–5.1)
Sodium: 140 mmol/L (ref 135–145)

## 2021-06-15 MED ORDER — OXYCODONE HCL 5 MG PO TABS
5.0000 mg | ORAL_TABLET | ORAL | Status: DC | PRN
  Administered 2021-06-15 – 2021-06-16 (×3): 5 mg via ORAL
  Administered 2021-06-16 – 2021-06-17 (×3): 10 mg via ORAL
  Administered 2021-06-17 – 2021-06-20 (×3): 5 mg via ORAL
  Filled 2021-06-15 (×2): qty 2
  Filled 2021-06-15: qty 1
  Filled 2021-06-15: qty 2
  Filled 2021-06-15 (×3): qty 1
  Filled 2021-06-15 (×2): qty 2
  Filled 2021-06-15: qty 1
  Filled 2021-06-15 (×2): qty 2
  Filled 2021-06-15: qty 1

## 2021-06-15 MED ORDER — ALPRAZOLAM 0.25 MG PO TABS
0.2500 mg | ORAL_TABLET | Freq: Three times a day (TID) | ORAL | Status: DC | PRN
  Administered 2021-06-15 – 2021-06-19 (×6): 0.25 mg via ORAL
  Filled 2021-06-15 (×6): qty 1

## 2021-06-15 MED ORDER — FOLIC ACID 1 MG PO TABS
1.0000 mg | ORAL_TABLET | Freq: Every day | ORAL | Status: DC
  Administered 2021-06-16 – 2021-06-21 (×6): 1 mg via ORAL
  Filled 2021-06-15 (×6): qty 1

## 2021-06-15 MED ORDER — BETHANECHOL CHLORIDE 10 MG PO TABS
10.0000 mg | ORAL_TABLET | Freq: Three times a day (TID) | ORAL | Status: DC
  Administered 2021-06-15 – 2021-06-21 (×20): 10 mg via ORAL
  Filled 2021-06-15 (×22): qty 1

## 2021-06-15 MED ORDER — QUETIAPINE FUMARATE 100 MG PO TABS
100.0000 mg | ORAL_TABLET | Freq: Three times a day (TID) | ORAL | Status: DC
  Administered 2021-06-15 – 2021-06-19 (×15): 100 mg via ORAL
  Filled 2021-06-15 (×15): qty 1

## 2021-06-15 MED ORDER — ACETAMINOPHEN 325 MG PO TABS
650.0000 mg | ORAL_TABLET | Freq: Four times a day (QID) | ORAL | Status: DC
  Administered 2021-06-15 – 2021-06-21 (×24): 650 mg via ORAL
  Filled 2021-06-15 (×25): qty 2

## 2021-06-15 MED ORDER — CHLORDIAZEPOXIDE HCL 5 MG PO CAPS
5.0000 mg | ORAL_CAPSULE | Freq: Three times a day (TID) | ORAL | Status: AC
  Administered 2021-06-15 (×3): 5 mg via ORAL
  Filled 2021-06-15 (×3): qty 1

## 2021-06-15 MED ORDER — DOCUSATE SODIUM 50 MG/5ML PO LIQD
100.0000 mg | Freq: Two times a day (BID) | ORAL | Status: DC
  Administered 2021-06-15 – 2021-06-21 (×11): 100 mg via ORAL
  Filled 2021-06-15 (×13): qty 10

## 2021-06-15 MED ORDER — APIXABAN 5 MG PO TABS
5.0000 mg | ORAL_TABLET | Freq: Two times a day (BID) | ORAL | Status: DC
  Administered 2021-06-15 – 2021-06-21 (×13): 5 mg via ORAL
  Filled 2021-06-15 (×13): qty 1

## 2021-06-15 MED ORDER — METOPROLOL TARTRATE 12.5 MG HALF TABLET
12.5000 mg | ORAL_TABLET | Freq: Two times a day (BID) | ORAL | Status: DC
  Administered 2021-06-15 – 2021-06-21 (×13): 12.5 mg via ORAL
  Filled 2021-06-15 (×13): qty 1

## 2021-06-15 MED ORDER — METHOCARBAMOL 750 MG PO TABS
750.0000 mg | ORAL_TABLET | Freq: Three times a day (TID) | ORAL | Status: DC
  Administered 2021-06-15 – 2021-06-21 (×19): 750 mg via ORAL
  Filled 2021-06-15 (×19): qty 1

## 2021-06-15 MED ORDER — THIAMINE HCL 100 MG PO TABS
100.0000 mg | ORAL_TABLET | Freq: Every day | ORAL | Status: DC
  Administered 2021-06-15 – 2021-06-21 (×7): 100 mg via ORAL
  Filled 2021-06-15 (×7): qty 1

## 2021-06-15 MED ORDER — ADULT MULTIVITAMIN W/MINERALS CH
1.0000 | ORAL_TABLET | Freq: Every day | ORAL | Status: DC
  Administered 2021-06-15 – 2021-06-21 (×7): 1 via ORAL
  Filled 2021-06-15 (×7): qty 1
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
## 2021-06-16 LAB — GLUCOSE, CAPILLARY
Glucose-Capillary: 78 mg/dL (ref 70–99)
Glucose-Capillary: 94 mg/dL (ref 70–99)
Glucose-Capillary: 121 mg/dL — ABNORMAL HIGH (ref 70–99)
Glucose-Capillary: 95 mg/dL (ref 70–99)
Glucose-Capillary: 99 mg/dL (ref 70–99)

## 2021-06-16 MED ORDER — INSULIN DETEMIR 100 UNIT/ML ~~LOC~~ SOLN
18.0000 [IU] | Freq: Two times a day (BID) | SUBCUTANEOUS | Status: DC
  Administered 2021-06-16: 18 [IU] via SUBCUTANEOUS
  Filled 2021-06-16 (×3): qty 0.18

## 2021-06-16 NOTE — Plan of Care (Signed)

## 2021-06-16 NOTE — Discharge Instructions (Addendum)
Please remain nonweightbearing of the left wrist and right lower extremity until cleared by Ortho. You are cleared for weight bearing as tolerated of the left lower extremity for TRANSFERS ONLY.   Information on my medicine - ELIQUIS (apixaban)  This medication education was reviewed with me or my healthcare representative as part of my discharge preparation.   Why was Eliquis prescribed for you? Eliquis was prescribed to treat blood clots that may have been found in the veins of your legs (deep vein thrombosis) or in your lungs (pulmonary embolism) and to reduce the risk of them occurring again.  What do You need to know about Eliquis ? The apixaban (Eliquis)  dose is ONE 5 mg tablet taken TWICE daily.  Eliquis may be taken with or without food.   Try to take the dose about the same time in the morning and in the evening. If you have difficulty swallowing the tablet whole please discuss with your pharmacist how to take the medication safely.  Take Eliquis exactly as prescribed and DO NOT stop taking Eliquis without talking to the doctor who prescribed the medication.  Stopping may increase your risk of developing a new blood clot.  Refill your prescription before you run out.  After discharge, you should have regular check-up appointments with your healthcare provider that is prescribing your Eliquis.    What do you do if you miss a dose? If a dose of ELIQUIS is not taken at the scheduled time, take it as soon as possible on the same day and twice-daily administration should be resumed. The dose should not be doubled to make up for a missed dose.  Important Safety Information A possible side effect of Eliquis is bleeding. You should call your healthcare provider right away if you experience any of the following: Bleeding from an injury or your nose that does not stop. Unusual colored urine (red or dark brown) or unusual colored stools (red or black). Unusual bruising for unknown  reasons. A serious fall or if you hit your head (even if there is no bleeding).  Some medicines may interact with Eliquis and might increase your risk of bleeding or clotting while on Eliquis. To help avoid this, consult your healthcare provider or pharmacist prior to using any new prescription or non-prescription medications, including herbals, vitamins, non-steroidal anti-inflammatory drugs (NSAIDs) and supplements.  This website has more information on Eliquis (apixaban): http://www.eliquis.com/eliquis/home
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
## 2021-06-17 LAB — GLUCOSE, CAPILLARY
Glucose-Capillary: 106 mg/dL — ABNORMAL HIGH (ref 70–99)
Glucose-Capillary: 117 mg/dL — ABNORMAL HIGH (ref 70–99)
Glucose-Capillary: 140 mg/dL — ABNORMAL HIGH (ref 70–99)
Glucose-Capillary: 149 mg/dL — ABNORMAL HIGH (ref 70–99)
Glucose-Capillary: 75 mg/dL (ref 70–99)
Glucose-Capillary: 77 mg/dL (ref 70–99)
Glucose-Capillary: 87 mg/dL (ref 70–99)
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
## 2021-06-17 NOTE — Progress Notes (Signed)
Inpatient Rehab Admissions Coordinator:   Met with patient at the bedside, and again later with her daughters at the bedside.  Noted that [place] had terminated with the death of her spouse.  Daughters state that they have gotten a COBRA policy, which should be effective.  They will be able to provide me with the policy number for this and I will start auth process.  Pt is doing phenomenally well.    Shann Medal, PT, DPT Admissions Coordinator [phone] 06/17/21  2:02 PM

## 2021-06-18 LAB — GLUCOSE, CAPILLARY: Glucose-Capillary: 126 mg/dL — ABNORMAL HIGH (ref 70–99)
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
## 2021-06-18 NOTE — Progress Notes (Signed)
Inpatient Rehab Admissions Coordinator:   Request for prior authorization initiated this AM.  Will continue to follow for determination for CIR.   Shann Medal, PT, DPT Admissions Coordinator [phone] 06/18/21  2:30 PM
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
## 2021-06-19 NOTE — Progress Notes (Signed)
Inpatient Rehab Admissions Coordinator:   Awaiting determination from [facility] regarding prior auth request for CIR.  Will continue to follow.   Shann Medal, PT, DPT Admissions Coordinator [phone] 06/19/21  11:25 AM
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
## 2021-06-19 NOTE — Progress Notes (Signed)
SLP Cancellation Note  Patient Details Name: Melissa Harrington MRN: [id number] DOB: Feb 23, 1971   Cancelled treatment:       Reason Eval/Treat Not Completed: Patient at procedure or test/unavailable. Pt working with OT.    Zanovia Rotz, Katherene Ponto 06/19/2021, 11:42 AM

## 2021-06-20 MED ORDER — QUETIAPINE FUMARATE 50 MG PO TABS
50.0000 mg | ORAL_TABLET | Freq: Three times a day (TID) | ORAL | Status: DC
  Administered 2021-06-20 – 2021-06-21 (×5): 50 mg via ORAL
  Filled 2021-06-20 (×5): qty 1

## 2021-06-20 NOTE — Plan of Care (Signed)
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
## 2021-06-21 ENCOUNTER — Encounter (HOSPITAL_COMMUNITY): Payer: Self-pay | Admitting: Physical Medicine & Rehabilitation

## 2021-06-21 ENCOUNTER — Inpatient Hospital Stay (HOSPITAL_COMMUNITY)
Admission: RE | Admit: 2021-06-21 | Discharge: 2021-07-12 | DRG: 945 | Disposition: A | Discharge status: Home Health | Payer: 59 | Source: Intra-hospital | Attending: Physical Medicine & Rehabilitation | Admitting: Physical Medicine & Rehabilitation

## 2021-06-21 ENCOUNTER — Encounter (HOSPITAL_COMMUNITY): Payer: Self-pay | Admitting: General Surgery

## 2021-06-21 ENCOUNTER — Other Ambulatory Visit: Payer: Self-pay

## 2021-06-21 ENCOUNTER — Encounter (HOSPITAL_COMMUNITY): Payer: Self-pay | Admitting: Internal Medicine

## 2021-06-21 DIAGNOSIS — S32401D Unspecified fracture of right acetabulum, subsequent encounter for fracture with routine healing: Secondary | ICD-10-CM

## 2021-06-21 DIAGNOSIS — Z8249 Family history of ischemic heart disease and other diseases of the circulatory system: Secondary | ICD-10-CM | POA: Diagnosis not present

## 2021-06-21 DIAGNOSIS — R030 Elevated blood-pressure reading, without diagnosis of hypertension: Secondary | ICD-10-CM

## 2021-06-21 DIAGNOSIS — Z809 Family history of malignant neoplasm, unspecified: Secondary | ICD-10-CM

## 2021-06-21 DIAGNOSIS — S72451D Displaced supracondylar fracture without intracondylar extension of lower end of right femur, subsequent encounter for closed fracture with routine healing: Secondary | ICD-10-CM

## 2021-06-21 DIAGNOSIS — Z85820 Personal history of malignant melanoma of skin: Secondary | ICD-10-CM | POA: Diagnosis not present

## 2021-06-21 DIAGNOSIS — Z888 Allergy status to other drugs, medicaments and biological substances status: Secondary | ICD-10-CM

## 2021-06-21 DIAGNOSIS — D62 Acute posthemorrhagic anemia: Secondary | ICD-10-CM | POA: Diagnosis present

## 2021-06-21 DIAGNOSIS — S42352D Displaced comminuted fracture of shaft of humerus, left arm, subsequent encounter for fracture with routine healing: Secondary | ICD-10-CM | POA: Diagnosis not present

## 2021-06-21 DIAGNOSIS — S72302D Unspecified fracture of shaft of left femur, subsequent encounter for closed fracture with routine healing: Secondary | ICD-10-CM

## 2021-06-21 DIAGNOSIS — S066X9D Traumatic subarachnoid hemorrhage with loss of consciousness of unspecified duration, subsequent encounter: Secondary | ICD-10-CM | POA: Diagnosis present

## 2021-06-21 DIAGNOSIS — G8918 Other acute postprocedural pain: Secondary | ICD-10-CM | POA: Diagnosis present

## 2021-06-21 DIAGNOSIS — Z811 Family history of alcohol abuse and dependence: Secondary | ICD-10-CM

## 2021-06-21 DIAGNOSIS — E876 Hypokalemia: Secondary | ICD-10-CM | POA: Diagnosis not present

## 2021-06-21 DIAGNOSIS — F4310 Post-traumatic stress disorder, unspecified: Secondary | ICD-10-CM | POA: Diagnosis present

## 2021-06-21 DIAGNOSIS — R339 Retention of urine, unspecified: Secondary | ICD-10-CM

## 2021-06-21 DIAGNOSIS — S72402S Unspecified fracture of lower end of left femur, sequela: Secondary | ICD-10-CM

## 2021-06-21 DIAGNOSIS — N179 Acute kidney failure, unspecified: Secondary | ICD-10-CM | POA: Diagnosis present

## 2021-06-21 DIAGNOSIS — S069X1S Unspecified intracranial injury with loss of consciousness of 30 minutes or less, sequela: Secondary | ICD-10-CM | POA: Diagnosis not present

## 2021-06-21 DIAGNOSIS — S82201D Unspecified fracture of shaft of right tibia, subsequent encounter for closed fracture with routine healing: Secondary | ICD-10-CM | POA: Diagnosis not present

## 2021-06-21 DIAGNOSIS — S069X9A Unspecified intracranial injury with loss of consciousness of unspecified duration, initial encounter: Principal | ICD-10-CM | POA: Diagnosis present

## 2021-06-21 DIAGNOSIS — T07XXXA Unspecified multiple injuries, initial encounter: Secondary | ICD-10-CM | POA: Diagnosis not present

## 2021-06-21 DIAGNOSIS — I10 Essential (primary) hypertension: Secondary | ICD-10-CM | POA: Diagnosis present

## 2021-06-21 DIAGNOSIS — Z881 Allergy status to other antibiotic agents status: Secondary | ICD-10-CM

## 2021-06-21 DIAGNOSIS — L89811 Pressure ulcer of head, stage 1: Secondary | ICD-10-CM | POA: Diagnosis present

## 2021-06-21 DIAGNOSIS — S069X0D Unspecified intracranial injury without loss of consciousness, subsequent encounter: Secondary | ICD-10-CM | POA: Diagnosis not present

## 2021-06-21 DIAGNOSIS — S069X0S Unspecified intracranial injury without loss of consciousness, sequela: Secondary | ICD-10-CM | POA: Diagnosis not present

## 2021-06-21 DIAGNOSIS — I2699 Other pulmonary embolism without acute cor pulmonale: Secondary | ICD-10-CM | POA: Diagnosis present

## 2021-06-21 DIAGNOSIS — G4700 Insomnia, unspecified: Secondary | ICD-10-CM | POA: Diagnosis present

## 2021-06-21 DIAGNOSIS — S069X9S Unspecified intracranial injury with loss of consciousness of unspecified duration, sequela: Secondary | ICD-10-CM

## 2021-06-21 DIAGNOSIS — Z880 Allergy status to penicillin: Secondary | ICD-10-CM

## 2021-06-21 DIAGNOSIS — S069X1D Unspecified intracranial injury with loss of consciousness of 30 minutes or less, subsequent encounter: Secondary | ICD-10-CM | POA: Diagnosis not present

## 2021-06-21 DIAGNOSIS — S0292XD Unspecified fracture of facial bones, subsequent encounter for fracture with routine healing: Secondary | ICD-10-CM

## 2021-06-21 DIAGNOSIS — S069XAA Unspecified intracranial injury with loss of consciousness status unknown, initial encounter: Secondary | ICD-10-CM | POA: Diagnosis present

## 2021-06-21 DIAGNOSIS — S32810S Multiple fractures of pelvis with stable disruption of pelvic ring, sequela: Secondary | ICD-10-CM

## 2021-06-21 DIAGNOSIS — S069X3S Unspecified intracranial injury with loss of consciousness of 1 hour to 5 hours 59 minutes, sequela: Secondary | ICD-10-CM | POA: Diagnosis not present

## 2021-06-21 DIAGNOSIS — T148XXA Other injury of unspecified body region, initial encounter: Secondary | ICD-10-CM

## 2021-06-21 MED ORDER — TRAMADOL HCL 50 MG PO TABS
50.0000 mg | ORAL_TABLET | Freq: Three times a day (TID) | ORAL | Status: DC
  Administered 2021-06-21 – 2021-07-12 (×82): 50 mg via ORAL
  Filled 2021-06-21 (×82): qty 1

## 2021-06-21 MED ORDER — ASCORBIC ACID 500 MG PO TABS
500.0000 mg | ORAL_TABLET | Freq: Every day | ORAL | Status: DC
  Administered 2021-06-21 – 2021-07-12 (×22): 500 mg via ORAL
  Filled 2021-06-21 (×22): qty 1

## 2021-06-21 MED ORDER — PROCHLORPERAZINE 25 MG RE SUPP: 12.5000 mg | Freq: Four times a day (QID) | RECTAL | Status: DC | PRN

## 2021-06-21 MED ORDER — FOLIC ACID 1 MG PO TABS
1.0000 mg | ORAL_TABLET | Freq: Every day | ORAL | Status: DC
  Administered 2021-06-22 – 2021-07-12 (×21): 1 mg via ORAL
  Filled 2021-06-21 (×21): qty 1

## 2021-06-21 MED ORDER — ADULT MULTIVITAMIN W/MINERALS CH
1.0000 | ORAL_TABLET | Freq: Every day | ORAL | Status: DC
  Administered 2021-06-22 – 2021-07-12 (×21): 1 via ORAL
  Filled 2021-06-21 (×21): qty 1

## 2021-06-21 MED ORDER — MELATONIN 3 MG PO TABS
3.0000 mg | ORAL_TABLET | Freq: Every day | ORAL | Status: DC
  Administered 2021-06-21 – 2021-07-11 (×21): 3 mg via ORAL
  Filled 2021-06-21 (×22): qty 1

## 2021-06-21 MED ORDER — METOPROLOL TARTRATE 12.5 MG HALF TABLET
12.5000 mg | ORAL_TABLET | Freq: Two times a day (BID) | ORAL | Status: DC
  Administered 2021-06-21 – 2021-07-05 (×28): 12.5 mg via ORAL
  Filled 2021-06-21 (×28): qty 1

## 2021-06-21 MED ORDER — DOCUSATE SODIUM 100 MG PO CAPS: 100.0000 mg | ORAL_CAPSULE | Freq: Two times a day (BID) | ORAL | Status: DC

## 2021-06-21 MED ORDER — LIDOCAINE HCL URETHRAL/MUCOSAL 2 % EX GEL: CUTANEOUS | Status: DC | PRN

## 2021-06-21 MED ORDER — ALPRAZOLAM 0.25 MG PO TABS
0.2500 mg | ORAL_TABLET | Freq: Three times a day (TID) | ORAL | Status: DC | PRN
  Administered 2021-06-22: 0.25 mg via ORAL
  Filled 2021-06-21: qty 1

## 2021-06-21 MED ORDER — ORAL CARE MOUTH RINSE
15.0000 mL | Freq: Two times a day (BID) | OROMUCOSAL | Status: DC
  Administered 2021-06-22 – 2021-07-11 (×27): 15 mL via OROMUCOSAL

## 2021-06-21 MED ORDER — FLEET ENEMA 7-19 GM/118ML RE ENEM: 1.0000 | ENEMA | Freq: Once | RECTAL | Status: DC | PRN

## 2021-06-21 MED ORDER — APIXABAN 5 MG PO TABS
5.0000 mg | ORAL_TABLET | Freq: Two times a day (BID) | ORAL | Status: DC
  Administered 2021-06-21 – 2021-07-12 (×42): 5 mg via ORAL
  Filled 2021-06-21 (×42): qty 1

## 2021-06-21 MED ORDER — ACETAMINOPHEN 325 MG PO TABS
325.0000 mg | ORAL_TABLET | ORAL | Status: DC | PRN
  Administered 2021-06-22 – 2021-06-27 (×5): 650 mg via ORAL
  Administered 2021-06-28: 325 mg via ORAL
  Administered 2021-06-29 – 2021-07-02 (×2): 650 mg via ORAL
  Filled 2021-06-21 (×11): qty 2

## 2021-06-21 MED ORDER — TRAMADOL HCL 50 MG PO TABS
50.0000 mg | ORAL_TABLET | Freq: Four times a day (QID) | ORAL | Status: DC | PRN
  Administered 2021-06-21: 50 mg via ORAL
  Filled 2021-06-21: qty 1

## 2021-06-21 MED ORDER — METHOCARBAMOL 750 MG PO TABS
750.0000 mg | ORAL_TABLET | Freq: Three times a day (TID) | ORAL | Status: DC
  Administered 2021-06-21 – 2021-06-22 (×2): 750 mg via ORAL
  Filled 2021-06-21 (×2): qty 1

## 2021-06-21 MED ORDER — PROCHLORPERAZINE MALEATE 5 MG PO TABS: 5.0000 mg | ORAL_TABLET | Freq: Four times a day (QID) | ORAL | Status: DC | PRN

## 2021-06-21 MED ORDER — ALBUTEROL SULFATE (2.5 MG/3ML) 0.083% IN NEBU: 2.5000 mg | INHALATION_SOLUTION | RESPIRATORY_TRACT | Status: DC | PRN

## 2021-06-21 MED ORDER — WITCH HAZEL-GLYCERIN EX PADS
MEDICATED_PAD | CUTANEOUS | Status: DC | PRN
  Filled 2021-06-21: qty 100

## 2021-06-21 MED ORDER — THIAMINE HCL 100 MG PO TABS
100.0000 mg | ORAL_TABLET | Freq: Every day | ORAL | Status: DC
Start: 2021-06-22 — End: 2021-07-01
  Administered 2021-06-22 – 2021-07-01 (×10): 100 mg via ORAL
  Filled 2021-06-21 (×10): qty 1

## 2021-06-21 MED ORDER — ALUM & MAG HYDROXIDE-SIMETH 200-200-20 MG/5ML PO SUSP: 30.0000 mL | ORAL | Status: DC | PRN

## 2021-06-21 MED ORDER — GUAIFENESIN-DM 100-10 MG/5ML PO SYRP: 5.0000 mL | ORAL_SOLUTION | Freq: Four times a day (QID) | ORAL | Status: DC | PRN

## 2021-06-21 MED ORDER — BETHANECHOL CHLORIDE 10 MG PO TABS
10.0000 mg | ORAL_TABLET | Freq: Three times a day (TID) | ORAL | Status: DC
  Administered 2021-06-21 – 2021-06-28 (×20): 10 mg via ORAL
  Filled 2021-06-21 (×20): qty 1

## 2021-06-21 MED ORDER — POLYETHYLENE GLYCOL 3350 17 G PO PACK: 17.0000 g | PACK | Freq: Every day | ORAL | Status: DC | PRN

## 2021-06-21 MED ORDER — BISACODYL 10 MG RE SUPP: 10.0000 mg | Freq: Every day | RECTAL | Status: DC | PRN

## 2021-06-21 MED ORDER — DIPHENHYDRAMINE HCL 12.5 MG/5ML PO ELIX: 12.5000 mg | ORAL_SOLUTION | Freq: Four times a day (QID) | ORAL | Status: DC | PRN

## 2021-06-21 MED ORDER — ACETAMINOPHEN 325 MG PO TABS
650.0000 mg | ORAL_TABLET | Freq: Four times a day (QID) | ORAL | Status: DC
  Administered 2021-06-21 – 2021-07-12 (×68): 650 mg via ORAL
  Filled 2021-06-21 (×70): qty 2

## 2021-06-21 MED ORDER — TRAZODONE HCL 50 MG PO TABS: 25.0000 mg | ORAL_TABLET | Freq: Every evening | ORAL | Status: DC | PRN

## 2021-06-21 MED ORDER — PRAZOSIN HCL 1 MG PO CAPS
1.0000 mg | ORAL_CAPSULE | Freq: Every day | ORAL | Status: DC
  Administered 2021-06-21 – 2021-07-01 (×11): 1 mg via ORAL
  Filled 2021-06-21 (×12): qty 1

## 2021-06-21 MED ORDER — PROCHLORPERAZINE EDISYLATE 10 MG/2ML IJ SOLN: 5.0000 mg | Freq: Four times a day (QID) | INTRAMUSCULAR | Status: DC | PRN

## 2021-06-21 MED ORDER — QUETIAPINE FUMARATE 50 MG PO TABS
50.0000 mg | ORAL_TABLET | Freq: Three times a day (TID) | ORAL | Status: DC
  Administered 2021-06-21 – 2021-06-22 (×2): 50 mg via ORAL
  Filled 2021-06-21 (×2): qty 1

## 2021-06-21 NOTE — Progress Notes (Signed)
Inpatient Rehab Admissions Coordinator:   I have insurance authorization and a bed available for this patient to admit to CIR today.  Trauma service in agreement. I will let pt/family know.   Shann Medal, PT, DPT Admissions Coordinator [phone] 06/21/21  1:58 PM

## 2021-06-21 NOTE — TOC Transition Note (Signed)
Transition of Care [facility]) - CM/SW Discharge Note   Patient Details  Name: Melissa Harrington MRN: [id number] Date of Birth: Mar 22, 1971  [facility]) CM/SW Contact:  Ella Bodo, RN Phone Number: 06/21/2021, 2:28 PM   Clinical Narrative:   Pt medically stable for discharge and insurance authorization has been received for admission to [facility].  Plan dc to CIR bed when available.     Final next level of care: IP Rehab Facility Barriers to Discharge: Barriers Resolved   Patient Goals and CMS Choice Patient states their goals for this hospitalization and ongoing recovery are:: to go home CMS Medicare.gov Compare Post Acute Care list provided to:: Patient Represenative (must comment) (daughter) Choice offered to / list presented to : Patient, Adult Children                         Discharge Plan and Services   Discharge Planning Services: CM Consult                                 Social Determinants of Health (SDOH) Interventions     Readmission Risk Interventions No flowsheet data found.  Reinaldo Raddle, RN, BSN  Trauma/Neuro ICU Case Manager [phone]
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
## 2021-06-21 NOTE — Progress Notes (Signed)
Inpatient Rehabilitation  Patient information reviewed and entered into eRehab system by Iness Pangilinan M. Kruz Chiu, M.A., CCC/SLP, PPS Coordinator.  Information including medical coding, functional ability and quality indicators will be reviewed and updated through discharge.    

## 2021-06-21 NOTE — Progress Notes (Signed)
Inpatient Rehabilitation Medication Review by a Pharmacist  A complete drug regimen review was completed for this patient to identify any potential clinically significant medication issues.  Clinically significant medication issues were identified:  no  Check AMION for pharmacist assigned to patient if future medication questions/issues arise during this admission.  Pharmacist comments:   Time spent performing this drug regimen review (minutes):  20   Breindy Meadow 06/21/2021 8:37 PM
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
## 2021-06-21 NOTE — Progress Notes (Deleted)
Inpatient Rehab Admissions Coordinator:   Met with pt's significant other Stanton Kidney outside the room.  We discussed CIR recommendations including average length of stay about 2 weeks and expectations of need for 24/7 supervision at discharge.  Stanton Kidney reports that between herself and pt's brother he will have 24/7 supervision.  Her apartment is on the first floor and is RW accessible.  Plan for brother to come from [place] and stay in pt's apartment on third floor, and stay with him on the first floor while Stanton Kidney is at work.  Sent message to [facility] with worker's comp to see if she needed any information from me, and checking bed availability for possible admit Sunday if we can get approval and have a bed.    Shann Medal, PT, DPT Admissions Coordinator [phone] 06/21/21  1:08 PM

## 2021-06-21 NOTE — Progress Notes (Signed)
INPATIENT REHABILITATION ADMISSION NOTE   Arrival Method: bed     Mental Orientation: alert and oriented x4   Assessment: refer to flowsheets   Skin: multiple incision sited, abrasions, ecchymosis.    IV'S: none   Pain: generalized   Tubes and Drains: none   Safety Measures: fall risk   Vital Signs: flowsheets    Height and Weight:    Rehab Orientation: done by RN   Family: daughter at bedside    Notes:
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
## 2021-06-22 DIAGNOSIS — S069X3S Unspecified intracranial injury with loss of consciousness of 1 hour to 5 hours 59 minutes, sequela: Secondary | ICD-10-CM

## 2021-06-22 DIAGNOSIS — T07XXXA Unspecified multiple injuries, initial encounter: Secondary | ICD-10-CM

## 2021-06-22 DIAGNOSIS — G8918 Other acute postprocedural pain: Secondary | ICD-10-CM

## 2021-06-22 MED ORDER — METHOCARBAMOL 750 MG PO TABS
750.0000 mg | ORAL_TABLET | Freq: Four times a day (QID) | ORAL | Status: DC
  Administered 2021-06-22 – 2021-07-11 (×76): 750 mg via ORAL
  Filled 2021-06-22 (×76): qty 1

## 2021-06-22 MED ORDER — QUETIAPINE FUMARATE 50 MG PO TABS
50.0000 mg | ORAL_TABLET | Freq: Two times a day (BID) | ORAL | Status: DC
  Administered 2021-06-22 – 2021-06-28 (×12): 50 mg via ORAL
  Filled 2021-06-22 (×12): qty 1

## 2021-06-22 NOTE — Plan of Care (Signed)
Care plan reviewed with patient.

## 2021-06-22 NOTE — Plan of Care (Signed)
  Problem: RH Grooming Goal: LTG Patient will perform grooming w/assist,cues/equip (OT) Description: LTG: Patient will perform grooming with assist, with/without cues using equipment (OT) Flowsheets (Taken 06/22/2021 1251) LTG: Pt will perform grooming with assistance level of: Set up assist    Problem: RH Bathing Goal: LTG Patient will bathe all body parts with assist levels (OT) Description: LTG: Patient will bathe all body parts with assist levels (OT) Flowsheets (Taken 06/22/2021 1251) LTG: Pt will perform bathing with assistance level/cueing: Minimal Assistance - Patient > 75%   Problem: RH Dressing Goal: LTG Patient will perform upper body dressing (OT) Description: LTG Patient will perform upper body dressing with assist, with/without cues (OT). Flowsheets (Taken 06/22/2021 1251) LTG: Pt will perform upper body dressing with assistance level of: Set up assist Goal: LTG Patient will perform lower body dressing w/assist (OT) Description: LTG: Patient will perform lower body dressing with assist, with/without cues in positioning using equipment (OT) Flowsheets (Taken 06/22/2021 1251) LTG: Pt will perform lower body dressing with assistance level of: Minimal Assistance - Patient > 75%   Problem: RH Toileting Goal: LTG Patient will perform toileting task (3/3 steps) with assistance level (OT) Description: LTG: Patient will perform toileting task (3/3 steps) with assistance level (OT)  Flowsheets (Taken 06/22/2021 1251) LTG: Pt will perform toileting task (3/3 steps) with assistance level: Minimal Assistance - Patient > 75%   Problem: RH Toilet Transfers Goal: LTG Patient will perform toilet transfers w/assist (OT) Description: LTG: Patient will perform toilet transfers with assist, with/without cues using equipment (OT) Flowsheets (Taken 06/22/2021 1251) LTG: Pt will perform toilet transfers with assistance level of: Minimal Assistance - Patient > 75%   Problem: RH Tub/Shower Transfers Goal:  LTG Patient will perform tub/shower transfers w/assist (OT) Description: LTG: Patient will perform tub/shower transfers with assist, with/without cues using equipment (OT) Flowsheets (Taken 06/22/2021 1251) LTG: Pt will perform tub/shower stall transfers with assistance level of: Minimal Assistance - Patient > 75%
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
## 2021-06-22 NOTE — Progress Notes (Signed)
Blood tinged urine noted. MD notified through secure chat. Continued to observe.

## 2021-06-22 NOTE — Progress Notes (Signed)
Patient's urine is red to pink-tinged in color. No blood clot seen. Patient on Eliquis. Last hgb on 7/29 was 10g/dl. She denied discomfort on urination. Patient encouraged to increase fluid intake.
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
## 2021-06-22 NOTE — Progress Notes (Signed)
Per Portable equipment K pad not available at this time but they will look around.

## 2021-06-23 NOTE — Progress Notes (Addendum)
PROGRESS NOTE   Subjective/Complaints: Was frustrated that she couldn't stand in therapy yesterday. Pretty sore today but says that tramadol is working pretty well for pain. Doesn't want to use oxycodone. Finds that ice/heat helpful for pain.   ROS: Patient denies fever, rash, sore throat, blurred vision, nausea, vomiting, diarrhea, cough, shortness of breath or chest pain,      Objective:   No results found. No results for input(s): WBC, HGB, HCT, PLT in the last 72 hours. No results for input(s): NA, K, CL, CO2, GLUCOSE, BUN, CREATININE, CALCIUM in the last 72 hours.  Intake/Output Summary (Last 24 hours) at 06/23/2021 0948 Last data filed at 06/23/2021 0750 Gross per 24 hour  Intake 720 ml  Output --  Net 720 ml     Pressure Injury 06/10/21 Head Lower;Posterior Stage 1 -  Intact skin with non-blanchable redness of a localized area usually over a bony prominence. posterior head; difficult to assess d/t matted hair; approximately 3cm x 3cm? (Active)  06/10/21 0830  Location: Head  Location Orientation: Lower;Posterior  Staging: Stage 1 -  Intact skin with non-blanchable redness of a localized area usually over a bony prominence.  Wound Description (Comments): posterior head; difficult to assess d/t matted hair; approximately 3cm x 3cm?  Present on Admission: No    Physical Exam: Vital Signs Blood pressure (!) 139/91, pulse 86, temperature 97.9 F (36.6 C), resp. rate 17, height '5\' 8"'$  (1.727 m), weight 82.7 kg, SpO2 97 %.  Constitutional: No distress . Vital signs reviewed. HEENT: NCAT, EOMI, oral membranes moist Neck: supple Cardiovascular: RRR without murmur. No JVD    Respiratory/Chest: CTA Bilaterally without wheezes or rales. Normal effort    GI/Abdomen: BS +, non-tender, non-distended Ext: no clubbing, cyanosis, or edema Psych: pleasant and cooperative  Skin: multiple incisional sites on arms/legs, face all cdi  with +/- sutures. ?scalp wound Neuro:  Alert and oriented x 3. Normal insight and awareness. Intact Memory. Normal language and speech. Does demonstrate some processing/attentional delays.  Cranial nerve exam unremarkable. Moves all 4's but limited by pain/ortho. Sensation intact.   Musculoskeletal: remains tender with movement of limbs/ left arm in splint. Both legs with swelling. Mild swelling left hand.     Assessment/Plan: 1. Functional deficits which require 3+ hours per day of interdisciplinary therapy in a comprehensive inpatient rehab setting. Physiatrist is providing close team supervision and 24 hour management of active medical problems listed below. Physiatrist and rehab team continue to assess barriers to discharge/monitor patient progress toward functional and medical goals  Care Tool:  Bathing    Body parts bathed by patient: Left arm, Chest, Abdomen, Right upper leg, Left upper leg, Face   Body parts bathed by helper: Right arm, Front perineal area, Buttocks, Right lower leg, Left lower leg     Bathing assist Assist Level: 2 Helpers     Upper Body Dressing/Undressing Upper body dressing   What is the patient wearing?: Pull over shirt    Upper body assist Assist Level: Moderate Assistance - Patient 50 - 74%    Lower Body Dressing/Undressing Lower body dressing      What is the patient wearing?: Incontinence brief  Lower body assist Assist for lower body dressing: Maximal Assistance - Patient 25 - 49%     Toileting Toileting    Toileting assist Assist for toileting: Total Assistance - Patient < 25%     Transfers Chair/bed transfer  Transfers assist     Chair/bed transfer assist level: Dependent - Patient 0%     Locomotion Ambulation   Ambulation assist   Ambulation activity did not occur: Safety/medical concerns (NWB RLE)          Walk 10 feet activity   Assist  Walk 10 feet activity did not occur: Safety/medical concerns (NWB  RLE)        Walk 50 feet activity   Assist Walk 50 feet with 2 turns activity did not occur: Safety/medical concerns (NWB RLE)         Walk 150 feet activity   Assist Walk 150 feet activity did not occur: Safety/medical concerns (NWB RLE)         Walk 10 feet on uneven surface  activity   Assist Walk 10 feet on uneven surfaces activity did not occur: Safety/medical concerns (NWB RLE)         Wheelchair     Assist Will patient use wheelchair at discharge?: Yes Type of Wheelchair: Manual Wheelchair activity did not occur: Safety/medical concerns         Wheelchair 50 feet with 2 turns activity    Assist    Wheelchair 50 feet with 2 turns activity did not occur: Safety/medical concerns       Wheelchair 150 feet activity     Assist  Wheelchair 150 feet activity did not occur: Safety/medical concerns       Blood pressure (!) 139/91, pulse 86, temperature 97.9 F (36.6 C), resp. rate 17, height '5\' 8"'$  (1.727 m), weight 82.7 kg, SpO2 97 %. Medical Problem List and Plan: 1.  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  Antithrombotics: -DVT/anticoagulation:  Pharmaceutical: Other (comment) Eliquis             -antiplatelet therapy:  N/A 3. Pain Management: Oxycodone  prn  -pt prefers not to use oxydodone  - scheduled tramadol has been effective so far  -encouraged used of kpad -scheduled robaxin qid, ice 4. Mood/sleep:  LCSW to follow for evaluation and support.  -melatonin/trazodone             -antipsychotic agents:  seroquel 5. Neuropsych: This patient is almost capable of making decisions on her own behalf. 6. Skin/Wound Care: Routine pressure relief measures.              -continue local wound care as needed to multiple op/injury  sites 7. Fluids/Electrolytes/Nutrition: Monitor I/O. Check lytes in am. 8. Left humerus Fx and Left BBFx s/p ORIF: OK to WB on left elbow. --ROM as tolerated.  9. Right acetabular /Femur Fx s/p ORIF: NWB RLE with R-THR precautions 10. Open left femur Fx s/p ORIF, left acetab fx and SPR/IPR fx's: WBAT LLE for transfers only. 11. L-open BBFA Fx s/p ORIF: NWB on left wrist 12. Acute blood loss anemia: Recheck CBC Monday. 13. AKI: Encourage fluid intake as getting dry.  14. PTSD/Insomnia:  added prazosin as well as melatonin to help with sleep.             --monitor for now.seems to be in good spirits, has supportive family             --reduced seroquel to bid----wean off this week             -neuropsych f/u while here 15. Urinary retention: Monitor voiding with PVR checks. Volumes less than 350cc so far             --continue voiding trial, oob when possible.  -continue urecholine  -has had mild hematuria, urine sl cloudy---does appear to be clearing  -afebrile, labs tomorrow 16. HTN: Monitor BP tid--has been variable. -- orthostatic vs negative today.  17. Bowels: large bm this morning      LOS: 2 days A FACE TO FACE EVALUATION WAS PERFORMED  Melissa Harrington 06/23/2021, 9:48 AM

## 2021-06-24 ENCOUNTER — Encounter (HOSPITAL_COMMUNITY): Payer: Self-pay | Admitting: Physical Medicine & Rehabilitation

## 2021-06-24 DIAGNOSIS — S069X0S Unspecified intracranial injury without loss of consciousness, sequela: Secondary | ICD-10-CM

## 2021-06-24 LAB — CBC WITH DIFFERENTIAL/PLATELET
Abs Immature Granulocytes: 0.02 10*3/uL (ref 0.00–0.07)
Basophils Absolute: 0 10*3/uL (ref 0.0–0.1)
Basophils Relative: 0 %
Eosinophils Absolute: 0.2 10*3/uL (ref 0.0–0.5)
Eosinophils Relative: 4 %
HCT: 33.1 % — ABNORMAL LOW (ref 36.0–46.0)
Hemoglobin: 10.7 g/dL — ABNORMAL LOW (ref 12.0–15.0)
Immature Granulocytes: 0 %
Lymphocytes Relative: 18 %
Lymphs Abs: 0.9 10*3/uL (ref 0.7–4.0)
MCH: 29.8 pg (ref 26.0–34.0)
MCHC: 32.3 g/dL (ref 30.0–36.0)
MCV: 92.2 fL (ref 80.0–100.0)
Monocytes Absolute: 0.4 10*3/uL (ref 0.1–1.0)
Monocytes Relative: 8 %
Neutro Abs: 3.5 10*3/uL (ref 1.7–7.7)
Neutrophils Relative %: 70 %
Platelets: 227 10*3/uL (ref 150–400)
RBC: 3.59 MIL/uL — ABNORMAL LOW (ref 3.87–5.11)
RDW: 15.8 % — ABNORMAL HIGH (ref 11.5–15.5)
WBC: 5 10*3/uL (ref 4.0–10.5)
nRBC: 0 % (ref 0.0–0.2)

## 2021-06-24 LAB — COMPREHENSIVE METABOLIC PANEL
ALT: 34 U/L (ref 0–44)
AST: 27 U/L (ref 15–41)
Albumin: 2.5 g/dL — ABNORMAL LOW (ref 3.5–5.0)
Alkaline Phosphatase: 255 U/L — ABNORMAL HIGH (ref 38–126)
Anion gap: 9 (ref 5–15)
BUN: 6 mg/dL (ref 6–20)
CO2: 26 mmol/L (ref 22–32)
Calcium: 9.2 mg/dL (ref 8.9–10.3)
Chloride: 101 mmol/L (ref 98–111)
Creatinine, Ser: 0.53 mg/dL (ref 0.44–1.00)
GFR, Estimated: >60 mL/min (ref >60)
Glucose, Bld: 126 mg/dL — ABNORMAL HIGH (ref 70–99)
Potassium: 3.3 mmol/L — ABNORMAL LOW (ref 3.5–5.1)
Sodium: 136 mmol/L (ref 135–145)
Total Bilirubin: 0.8 mg/dL (ref 0.3–1.2)
Total Protein: 6.1 g/dL — ABNORMAL LOW (ref 6.5–8.1)

## 2021-06-24 MED ORDER — POTASSIUM CHLORIDE CRYS ER 10 MEQ PO TBCR
10.0000 meq | EXTENDED_RELEASE_TABLET | Freq: Two times a day (BID) | ORAL | Status: AC
  Administered 2021-06-24 – 2021-06-25 (×4): 10 meq via ORAL
  Filled 2021-06-24 (×4): qty 1

## 2021-06-24 NOTE — Care Management (Signed)
[facility] Individual Statement of Services  Patient Name:  Melissa Harrington  Date:  06/24/2021  Welcome to the [facility].  Our goal is to provide you with an individualized program based on your diagnosis and situation, designed to meet your specific needs.  With this comprehensive rehabilitation program, you will be expected to participate in at least 3 hours of rehabilitation therapies Monday-Friday, with modified therapy programming on the weekends.  Your rehabilitation program will include the following services:  Physical Therapy (PT), Occupational Therapy (OT), Speech Therapy (ST), 24 hour per day rehabilitation nursing, Therapeutic Recreaction (TR), Psychology, Neuropsychology, Care Coordinator, Rehabilitation Medicine, [place], and Other  Weekly team conferences will be held on Tuesdays to discuss your progress.  Your Inpatient Rehabilitation Care Coordinator will talk with you frequently to get your input and to update you on team discussions.  Team conferences with you and your family in attendance may also be held.  Expected length of stay: 3-4 weeks     Overall anticipated outcome: Minimal Assistance  Depending on your progress and recovery, your program may change. Your Inpatient Rehabilitation Care Coordinator will coordinate services and will keep you informed of any changes. Your Inpatient Rehabilitation Care Coordinator's name and contact numbers are listed  below.  The following services may also be recommended but are not provided by the [facility] will be made to provide these services after discharge if needed.  Arrangements include referral to agencies that provide these services.  Your insurance has been verified to be:  [employer] Psychologist, prison and probation services)  Your primary doctor is:  Shawnie Dapper  Pertinent information will be shared with your doctor and your insurance company.  Inpatient Rehabilitation Care Coordinator:  Cathleen Corti [phone] or [phone]  Information discussed with and copy given to patient by: Rana Snare, 06/24/2021, 9:06 AM

## 2021-06-24 NOTE — Progress Notes (Signed)
Speech Language Pathology Daily Session Note  Patient Details  Name: Melissa Harrington MRN: [id number] Date of Birth: 1971/03/09  Today's Date: 06/24/2021 SLP Individual Time: [id number] SLP Individual Time Calculation (min): 41 min  Short Term Goals: Week 1: SLP Short Term Goal 1 (Week 1): Pt will tolerate Dys 3/thin diet with no overt s/s aspiration with supervision level cues for use of strategies SLP Short Term Goal 2 (Week 1): Pt will participate in functional alternating attention tasks with mod A cues SLP Short Term Goal 3 (Week 1): Pt will complete mildly complex problem solving tasks with min A for 90% accuracy SLP Short Term Goal 4 (Week 1): Pt will complete medication and money management tasks in low stim environment with mod A SLP Short Term Goal 5 (Week 1): Pt will recall functional, novel information with 80% accuracy provided mod A cues for use of compensatory strategies  Skilled Therapeutic Interventions:Skilled ST services focused on cognitive skills. Pt reported blurry vision and current reading glasses were ineffective. SLP provided reading glasses from CIR, +2.75 appeared to work the best. SLP facilitated mildly complex problem solving and error awareness in mildly complex account balancing task, pt required min A verbal cues for error awareness only. SLP created medication list, pt demonstrated verbal problem solving of medication consumption 1-4x per day, pt required supervision A verbal cues for problem solving. SLP began QID pill organizer and will complete task in upcoming sessions. Pt was left in room with call bell within reach and bed alarm set. SLP recommends to continue skilled services.     Pain Pain Assessment Pain Scale: 0-10 Pain Score: Asleep Pain Type: Acute pain Pain Location: Leg Pain Orientation: Right Pain Descriptors / Indicators: Aching Pain Frequency: Intermittent Pain Onset: Awakened from sleep Pain Intervention(s): Medication (See  eMAR)  Therapy/Group: Individual Therapy  Yaresly Menzel  [facility] 06/24/2021, 7:43 AM

## 2021-06-24 NOTE — Progress Notes (Signed)
Occupational Therapy Session Note  Patient Details  Name: Melissa Harrington MRN: [id number] Date of Birth: January 03, 1971  Today's Date: 06/24/2021 OT Individual Time: 1015-1045 OT Individual Time Calculation (min): 30 min    Short Term Goals: Week 1:  OT Short Term Goal 1 (Week 1): Pt will complete BSC transfer with +2 assist OT Short Term Goal 2 (Week 1): Pt will participate in 1 OT session while OOB for ~45 minutes to increase OOB tolerance OT Short Term Goal 3 (Week 1): Pt will complete 1 grooming task with Mod A using adaptive strategies  Skilled Therapeutic Interventions/Progress Updates:    Pt received and requested to sponge bathe but she needed to use the bed pan first.  Pt did an excellent job directing care to this therapist so I could A her with rolling side to side in bed numerous times this session to remove brief, place pad, bed pan, cleanse, don new brief, etc.  Pt is able to direct care to avoid her being touched or moved in such a way that would cause pain.   Pt able to void b and b on pan.  Due to time limitations, pt was not able to bathe this session but can do so in the next session.    Pt resting in bed with all needs met.  Therapy Documentation Precautions:  Precautions Precautions: Posterior Hip Precaution Comments: vaginal bleeding from foley trauma Restrictions Weight Bearing Restrictions: Yes RUE Weight Bearing: Weight bearing as tolerated LUE Weight Bearing: Weight bear through elbow only RLE Weight Bearing: Non weight bearing LLE Weight Bearing: Weight bearing as tolerated   Pain:  pt stated she was premedicated and pain was "tolerable"      Therapy/Group: Individual Therapy  [place] [facility] 06/24/2021, 1:05 PM

## 2021-06-24 NOTE — Progress Notes (Signed)
Occupational Therapy TBI Note  Patient Details  Name: Melissa Harrington MRN: [id number] Date of Birth: [birth year]  Today's Date: 06/24/2021 OT Individual Time: 1400-1510 OT Individual Time Calculation (min): 70 min    Short Term Goals: Week 1:  OT Short Term Goal 1 (Week 1): Pt will complete BSC transfer with +2 assist OT Short Term Goal 2 (Week 1): Pt will participate in 1 OT session while OOB for ~45 minutes to increase OOB tolerance OT Short Term Goal 3 (Week 1): Pt will complete 1 grooming task with Mod A using adaptive strategies  Skilled Therapeutic Interventions/Progress Updates:    Pt received supine with moderate pain (unrated) to her R hip. Pt agreeable to OT session and very motivated. TIS w/c obtained in prep for transfer OOB. She completed bed mobility with mod A to EOB. Pt able to direct care but required cueing for adherence to posterior hip precautions and for technique. Pt completed UB bathing and dressing with mod A d/t LUE limitations. Pt hesitant to bear weight through her L elbow. Total A to don pants. Pt stood from EOB with 3 musketeers technique, max A +2, with 3rd person (pt's daughter) assisting with pulling up pants in standing. X2 attempts with fair adherence to RLE NWB. Pt returned to sitting and slideboard was placed. Max A +1 transfer to TIS. Pt with difficulty scooting back in chair and recline feature helpful. Pt requested to ride around the unit for change of scenery and she was taken in w/c. Pt returned to the room and was left sitting up with all needs met, daughter present.   BP Supine: 133/92 BP EOB: 121/82  Therapy Documentation Precautions:  Precautions Precautions: Posterior Hip Precaution Comments: vaginal bleeding from foley trauma Restrictions Weight Bearing Restrictions: Yes RUE Weight Bearing: Weight bearing as tolerated LUE Weight Bearing: Weight bear through elbow only RLE Weight Bearing: Non weight bearing LLE Weight Bearing: Weight bearing  as tolerated  Agitated Behavior Scale: TBI Observation Details Observation Environment: pt room Start of observation period - Date: 06/24/21 Start of observation period - Time: 1400 End of observation period - Date: 06/24/21 End of observation period - Time: 1510 Agitated Behavior Scale (DO NOT LEAVE BLANKS) Short attention span, easy distractibility, inability to concentrate: Absent Impulsive, impatient, low tolerance for pain or frustration: Absent Uncooperative, resistant to care, demanding: Absent Violent and/or threatening violence toward people or property: Absent Explosive and/or unpredictable anger: Absent Rocking, rubbing, moaning, or other self-stimulating behavior: Absent Pulling at tubes, restraints, etc.: Absent Wandering from treatment areas: Absent Restlessness, pacing, excessive movement: Absent Repetitive behaviors, motor, and/or verbal: Absent Rapid, loud, or excessive talking: Absent Sudden changes of mood: Absent Easily initiated or excessive crying and/or laughter: Absent Self-abusiveness, physical and/or verbal: Absent Agitated behavior scale total score: 14   Therapy/Group: Individual Therapy  Curtis Sites 06/24/2021, 3:15 PM

## 2021-06-24 NOTE — Progress Notes (Signed)
PROGRESS NOTE   Subjective/Complaints:  Discussed Hgb, K+ No sig pain c/os  ROS: Patient denies CP, SOB, N/V/D  Objective:   No results found. Recent Labs    06/24/21 0542  WBC 5.0  HGB 10.7*  HCT 33.1*  PLT 227   Recent Labs    06/24/21 0542  NA 136  K 3.3*  CL 101  CO2 26  GLUCOSE 126*  BUN 6  CREATININE 0.53  CALCIUM 9.2    Intake/Output Summary (Last 24 hours) at 06/24/2021 1049 Last data filed at 06/24/2021 0904 Gross per 24 hour  Intake 600 ml  Output --  Net 600 ml      Pressure Injury 06/10/21 Head Lower;Posterior Stage 1 -  Intact skin with non-blanchable redness of a localized area usually over a bony prominence. posterior head; difficult to assess d/t matted hair; approximately 3cm x 3cm? (Active)  06/10/21 0830  Location: Head  Location Orientation: Lower;Posterior  Staging: Stage 1 -  Intact skin with non-blanchable redness of a localized area usually over a bony prominence.  Wound Description (Comments): posterior head; difficult to assess d/t matted hair; approximately 3cm x 3cm?  Present on Admission: No    Physical Exam: Vital Signs Blood pressure 136/87, pulse 88, temperature 98.3 F (36.8 C), temperature source Oral, resp. rate 18, height '5\' 8"'$  (1.727 m), weight 82.7 kg, SpO2 98 %.  General: No acute distress Mood and affect are appropriate Heart: Regular rate and rhythm no rubs murmurs or extra sounds Lungs: Clear to auscultation, breathing unlabored, no rales or wheezes Abdomen: Positive bowel sounds, soft nontender to palpation, nondistended Extremities: No clubbing, cyanosis, or edema Skin: No evidence of breakdown, no evidence of rash  Motor- 5/5 in RUE, 3- Left delt and biceps, Left wrist in short arm splint RLE 3- Hip and knee ext 4/5 ADF/PF LLE 3- Hip and knee ext 4/5 ADF/PF Musculoskeletal: remains tender with movement of limbs/ left arm in splint. Both legs with  swelling. Mild swelling left hand.     Assessment/Plan: 1. Functional deficits which require 3+ hours per day of interdisciplinary therapy in a comprehensive inpatient rehab setting. Physiatrist is providing close team supervision and 24 hour management of active medical problems listed below. Physiatrist and rehab team continue to assess barriers to discharge/monitor patient progress toward functional and medical goals  Care Tool:  Bathing    Body parts bathed by patient: Left arm, Chest, Abdomen, Right upper leg, Left upper leg, Face   Body parts bathed by helper: Right arm, Front perineal area, Buttocks, Right lower leg, Left lower leg     Bathing assist Assist Level: 2 Helpers     Upper Body Dressing/Undressing Upper body dressing   What is the patient wearing?: Pull over shirt    Upper body assist Assist Level: Moderate Assistance - Patient 50 - 74%    Lower Body Dressing/Undressing Lower body dressing      What is the patient wearing?: Incontinence brief     Lower body assist Assist for lower body dressing: Maximal Assistance - Patient 25 - 49%     Toileting Toileting    Toileting assist Assist for toileting: Total Assistance -  Patient < 25%     Transfers Chair/bed transfer  Transfers assist  Chair/bed transfer activity did not occur: Safety/medical concerns  Chair/bed transfer assist level: Dependent - Patient 0%     Locomotion Ambulation   Ambulation assist   Ambulation activity did not occur: Safety/medical concerns (NWB RLE)          Walk 10 feet activity   Assist  Walk 10 feet activity did not occur: Safety/medical concerns (NWB RLE)        Walk 50 feet activity   Assist Walk 50 feet with 2 turns activity did not occur: Safety/medical concerns (NWB RLE)         Walk 150 feet activity   Assist Walk 150 feet activity did not occur: Safety/medical concerns (NWB RLE)         Walk 10 feet on uneven surface   activity   Assist Walk 10 feet on uneven surfaces activity did not occur: Safety/medical concerns (NWB RLE)         Wheelchair     Assist Will patient use wheelchair at discharge?: Yes Type of Wheelchair: Manual Wheelchair activity did not occur: Safety/medical concerns         Wheelchair 50 feet with 2 turns activity    Assist    Wheelchair 50 feet with 2 turns activity did not occur: Safety/medical concerns       Wheelchair 150 feet activity     Assist  Wheelchair 150 feet activity did not occur: Safety/medical concerns       Blood pressure 136/87, pulse 88, temperature 98.3 F (36.8 C), temperature source Oral, resp. rate 18, height '5\' 8"'$  (1.727 m), weight 82.7 kg, SpO2 98 %. Medical Problem List and Plan: 1.  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  Antithrombotics: -DVT/anticoagulation:  Pharmaceutical: Other (comment) Eliquis             -antiplatelet therapy:  N/A 3. Pain Management: Oxycodone  prn  -pt prefers not to use oxydodone  - scheduled tramadol has been effective so far  -encouraged used of kpad -scheduled robaxin qid, ice 4. Mood/sleep:  LCSW to follow for evaluation and support.  -melatonin/trazodone             -antipsychotic agents:  seroquel 5. Neuropsych: This patient is almost capable of making decisions on her own behalf. 6. Skin/Wound Care: Routine pressure relief measures.              -continue local wound care as needed to multiple op/injury sites 7. Fluids/Electrolytes/Nutrition: Monitor I/O. Check lytes in am. 8. Left humerus Fx and Left BBFx s/p ORIF: OK to WB on left elbow. --ROM as tolerated.  9. Right acetabular /Femur Fx s/p ORIF: NWB RLE with R-THR precautions 10. Open left femur Fx s/p ORIF, left acetab fx  and SPR/IPR fx's: WBAT LLE for transfers only. 11. L-open BBFA Fx s/p ORIF: NWB on left wrist 12. Acute blood loss anemia: Recheck CBC Monday. 13. AKI: Encourage fluid intake as getting dry.  14. PTSD/Insomnia: added prazosin as well as melatonin to help with sleep.             --monitor for  now.seems to be in good spirits, has supportive family             --reduced seroquel to bid----wean off this week             -neuropsych f/u while here 15. Urinary retention: Monitor voiding with PVR checks. Volumes less than 350cc so far             --continue voiding trial, oob when possible.  -continue urecholine  -has had mild hematuria, urine sl cloudy---does appear to be clearing  -afebrile, labs tomorrow 16. HTN: Monitor BP tid--has been variable. Vitals:   06/23/21 2001 06/24/21 0331  BP: 129/87 136/87  Pulse: (!) 105 88  Resp: 18 18  Temp: 98.2 F (36.8 C) 98.3 F (36.8 C)  SpO2: 94% 98%    17. Bowels: large bm this morning      LOS: 3 days A FACE TO FACE EVALUATION WAS PERFORMED  Charlett Blake 06/24/2021, 10:49 AM

## 2021-06-24 NOTE — Progress Notes (Signed)
Physical Therapy TBI Note  Patient Details  Name: Melissa Harrington MRN: [id number] Date of Birth: Jul 26, 1971  Today's Date: 06/24/2021 PT Individual Time: [id number] PT Individual Time Calculation (min): 30 min   Short Term Goals: Week 1:  PT Short Term Goal 1 (Week 1): Pt will complete supine to sit transfer to EOB with Mod Assist PT Short Term Goal 2 (Week 1): Pt will complete a sit to stand transfer with Mod Assist in setting of weight bearing precautions PT Short Term Goal 3 (Week 1): Pt will tolerate 30 mins of therapy without fatigue Week 2:     Skilled Therapeutic Interventions/Progress Updates:    Pain:  Pt reports knee, R arm, hip pain.  Distraction, encouragment, repositioning provided.  No number given. Treatment to tolerance.  Rest breaks and repositioning as needed.  Pt initially oob in wc, stating need to urinate, w/encouragment pt agreeable to attempting transfer to [facility].   Wc to [facility] Ms to L side using sliding board (total assist for placmeent, pt able to lean w/cues and cga for placement.  Pt transfers using RUE, LLE, and overall therapist mod assist for sequencing, mechanics, guiding wt shifting w/task, encouragment (pt anxious but not exceedingly so).  Daughter assisted w/set up, securing slidingboard and BSC during transfer, very amenable to hands on training/assist. Once on commode, pt able to lean side to side w/min to mod assist for removal of shorts/brief.   Pt continent of urine, performs hygiene w/set up, min assist for LE support. Pt then able to wt shift side to side w/min to mod assist for raising shorts only.   sliding board transfer bsc to bed w/daughter assisting as above, mod assist from therapist, cues as above.   Sit to supine w/max assist for LE management, trunk support w/transition.  Once supine, bed placed in trendelenberg and pt able to scoot using rails, mod assist. Pt rolled side to side w/therapist placing pillow between knees for support/pain control, uses  rails, min assist overall w/additional time.  Total assist for removing shorts, socks, and donning clean brief.   Pt left supine w/rails up x 3, alarm set, bed in lowest position, and needs in reach.  Precautions:  Precautions Precautions: Posterior Hip Precaution Comments: vaginal bleeding from foley trauma Restrictions Weight Bearing Restrictions: Yes RUE Weight Bearing: Weight bearing as tolerated LUE Weight Bearing: Weight bear through elbow only RLE Weight Bearing: Non weight bearing LLE Weight Bearing: Weight bearing as tolerated     Therapy/Group: Individual Therapy Callie Fielding, PT   Jerrilyn Cairo  :     06/24/2021, 5:49 PM

## 2021-06-24 NOTE — Progress Notes (Signed)
Inpatient Rehabilitation Care Coordinator Assessment and Plan Patient Details  Name: Melissa Harrington MRN: [id number] Date of Birth: [birth year]  Today's Date: 06/24/2021  Hospital Problems: Principal Problem:   TBI (traumatic brain injury) [facility]) Active Problems:   Critical polytrauma  Past Medical History:  Past Medical History:  Diagnosis Date   Allergy to alpha-gal 03/2019   Dr. Verlin Fester   Allergy to alpha-gal    Anxiety    + hx of panic attacks.  Was on lexapro for a short time in remote past.   Anxiety    Dyspareunia 10/10/2013   Endometrial polyp 10/17/2013   Essential hypertension    Hay fever    Hemorrhoids 10/10/2013   History of melanoma    HTN (hypertension)    Melanoma ([place])    Menometrorrhagia 2013   using herbal treatments and this has resolved.   Other and unspecified ovarian cyst 10/17/2013   PONV (postoperative nausea and vomiting)    Past Surgical History:  Past Surgical History:  Procedure Laterality Date   CAST APPLICATION Left 01/18/9178   Procedure: CAST APPLICATION;  Surgeon: Milly Jakob, MD;  Location: [place];  Service: Orthopedics;  Laterality: Left;   CESAREAN SECTION     3   CLOSED REDUCTION HUMERUS FRACTURE Left 05/25/2021   Procedure: CLOSED REDUCTION HUMERAL SHAFT;  Surgeon: Milly Jakob, MD;  Location: [place];  Service: Orthopedics;  Laterality: Left;   CLOSED REDUCTION RADIAL SHAFT Left 05/25/2021   Procedure: CLOSED REDUCTION RADIUS  AND ULNAR FRACTURE;  Surgeon: Milly Jakob, MD;  Location: [place];  Service: Orthopedics;  Laterality: Left;   COLONOSCOPY  11/19/2020   adenoma x 1.  Recall 5 yrs.   COLONOSCOPY WITH PROPOFOL N/A 11/19/2020   Procedure: COLONOSCOPY WITH PROPOFOL;  Surgeon: Eloise Harman, DO;  [facility];  Service: Endoscopy;  Laterality: N/A;  8:45 ASA II   ECTOPIC PREGNANCY SURGERY     four surgeries (left fallopian tube removed)   EXTERNAL FIXATION LEG Bilateral 05/25/2021   Procedure: EXTERNAL FIXATION LEG;   Surgeon: Erle Crocker, MD;  Location: [place];  Service: Orthopedics;  Laterality: Bilateral;   FEMUR IM NAIL Left 05/27/2021   Procedure: INTRAMEDULLARY (IM) NAIL FEMORAL;  Surgeon: Altamese [place], MD;  Location: [place];  Service: Orthopedics;  Laterality: Left;   I & D EXTREMITY Bilateral 05/25/2021   Procedure: IRRIGATION AND DEBRIDEMENT EXTREMITY;  Surgeon: Erle Crocker, MD;  Location: [place];  Service: Orthopedics;  Laterality: Bilateral;   I & D EXTREMITY Left 05/25/2021   Procedure: IRRIGATION AND DEBRIDEMENT EXTREMITY;  Surgeon: Milly Jakob, MD;  Location: [place];  Service: Orthopedics;  Laterality: Left;   INSERTION OF TRACTION PIN Right 05/25/2021   Procedure: INSERTION OF TRACTION PIN;  Surgeon: Erle Crocker, MD;  Location: [place];  Service: Orthopedics;  Laterality: Right;   LACERATION REPAIR Bilateral 05/25/2021   Procedure: REPAIR MULTIPLE LACERATIONS;  Surgeon: Erle Crocker, MD;  Location: [place];  Service: Orthopedics;  Laterality: Bilateral;   ORIF ACETABULAR FRACTURE Right 06/04/2021   Procedure: OPEN REDUCTION INTERNAL FIXATION (ORIF) RIGHT ACETABULAR FRACTURE;  Surgeon: Altamese Hodgkins, MD;  Location: [place];  Service: Orthopedics;  Laterality: Right;   ORIF FEMUR FRACTURE Right 05/27/2021   Procedure: OPEN REDUCTION INTERNAL FIXATION (ORIF) DISTAL FEMUR FRACTURE;  Surgeon: Altamese Marshall, MD;  Location: [place];  Service: Orthopedics;  Laterality: Right;   ORIF HUMERUS FRACTURE Left 06/04/2021   Procedure: OPEN REDUCTION INTERNAL FIXATION (ORIF) LEFT DISTAL HUMERUS  FRACTURE;  Surgeon: Altamese Vienna, MD;  Location: [place];  Service: Orthopedics;  Laterality: Left;   ORIF RADIAL FRACTURE Left 06/04/2021   Procedure: OPEN REDUCTION INTERNAL FIXATION (ORIF) LEFT FOREARM;  Surgeon: Altamese Kimball, MD;  Location: [place];  Service: Orthopedics;  Laterality: Left;   POLYPECTOMY  11/19/2020   Procedure: POLYPECTOMY;  Surgeon: Eloise Harman, DO;  [facility];   Service: Endoscopy;;   SKIN CANCER EXCISION  2007   on pt's back    Social History:  reports that she has never smoked. She has never used smokeless tobacco. She reports current alcohol use of about 2.0 standard drinks of alcohol per week. She reports that she does not use drugs.  Family / Support Systems Marital Status: Widow/Widower How Long?: 1 month Patient Roles: Parent Spouse/Significant Other: Widowed Children: 3 children- [name] (21), [name] (19; planning to go to college), Grayce Sessions (29; in highschool). Other Supports: family, friends, and GFD Anticipated Caregiver: Dtrs [name] and [name] until she goes back to college, and a friend Kyla Balzarine that will be coming from [place] to live with the family to help provide all their care needs. Ability/Limitations of Caregiver: None reported Caregiver Availability: 24/7 Family Dynamics: Pt lived with husband and children.  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 Education: college grad Read: Yes Write: Yes Employment Status: Employed Name of Employer: [employer] Length of Employment: 76 Return to Work Plans: TBD. Pt dtr works that she is working on trying to get widows benefits. States that she wil look into STD/LTD with employer at some point. Legal History/Current Legal Issues: Denies Guardian/Conservator: N/A   Abuse/Neglect Abuse/Neglect Assessment Can Be Completed: Yes Physical Abuse: Denies Verbal Abuse: Denies Sexual Abuse: Denies Exploitation of patient/patient's resources: Denies Self-Neglect: Denies  Emotional Status Pt's affect, behavior and adjustment status: Pt reports that she is doing ok. She has not had much time to think about everything that has happened. Pt adjusting as well to be expected. Pt open to meeting with neuropsychologist. Recent Psychosocial Issues:  Denies Psychiatric History: Denies Substance Abuse History: occassional EtoH. denies any other uses.  Patient / Family Perceptions, Expectations & Goals Pt/Family understanding of illness & functional limitations: Pt and family have a general understanding of care needs. Premorbid pt/family roles/activities: Independent Anticipated changes in roles/activities/participation: Assistance with ADLs/IADLs Pt/family expectations/goals: Pt reports she has not had much time to think about anything. since it is so much going on.  Community Resources Express Scripts: None Premorbid Home Care/DME Agencies: None Transportation available at discharge: TBD Resource referrals recommended: Neuropsychology  Discharge Planning Living Arrangements: Children, Non-relatives/Friends Support Systems: Children, Other relatives, Water engineer, Other (Comment) (GFD) Type of Residence: Private residence Insurance Resources: Multimedia programmer (specify) ([place] Proofreader)) Financial Resources: Family Support, Other (Comment) Financial Screen Referred: No Living Expenses: Medical laboratory scientific officer Management: Patient Does the patient have any problems obtaining your medications?: No Home Management: Pt, spouse and children manage home care needs. Patient/Family Preliminary Plans: Pt dtr [employer] all finances, and states she is getting help from a lot of people to help keep things in order. Care Coordinator Anticipated Follow Up Needs: HH/OP Expected length of stay: 3-4 weeks  Clinical Impression SW met with pt and pt dtr [name] in room to introduce self, explain role, and discuss discharge process. No HCPOA. Pt dtr reports she is working on getting all things in place. Pt not a veteran. No  DME.  Rana Snare 06/24/2021, 8:39 PM

## 2021-06-25 NOTE — Progress Notes (Signed)
PROGRESS NOTE   Subjective/Complaints:  Asked about a skin area noted by nsg Left post upper achilles area , no pain   ROS: Patient denies CP, SOB, N/V/D  Objective:   No results found. Recent Labs    06/24/21 0542  WBC 5.0  HGB 10.7*  HCT 33.1*  PLT 227    Recent Labs    06/24/21 0542  NA 136  K 3.3*  CL 101  CO2 26  GLUCOSE 126*  BUN 6  CREATININE 0.53  CALCIUM 9.2     Intake/Output Summary (Last 24 hours) at 06/25/2021 0950 Last data filed at 06/25/2021 0743 Gross per 24 hour  Intake 816 ml  Output 115 ml  Net 701 ml      Pressure Injury 06/10/21 Head Lower;Posterior Stage 1 -  Intact skin with non-blanchable redness of a localized area usually over a bony prominence. posterior head; difficult to assess d/t matted hair; approximately 3cm x 3cm? (Active)  06/10/21 0830  Location: Head  Location Orientation: Lower;Posterior  Staging: Stage 1 -  Intact skin with non-blanchable redness of a localized area usually over a bony prominence.  Wound Description (Comments): posterior head; difficult to assess d/t matted hair; approximately 3cm x 3cm?  Present on Admission: No    Physical Exam: Vital Signs Blood pressure (!) 141/91, pulse 83, temperature 98 F (36.7 C), temperature source Oral, resp. rate 18, height '5\' 8"'$  (1.727 m), weight 82.7 kg, SpO2 98 %.  General: No acute distress Mood and affect are appropriate Heart: Regular rate and rhythm no rubs murmurs or extra sounds Lungs: Clear to auscultation, breathing unlabored, no rales or wheezes Abdomen: Positive bowel sounds, soft nontender to palpation, nondistended Extremities: No clubbing, cyanosis, or edema Skin: Scab with serosang drainage no odor, no surrounding erythema   Motor- 5/5 in RUE, 3- Left delt and biceps, Left wrist in short arm splint RLE 3- Hip and knee ext 4/5 ADF/PF LLE 3- Hip and knee ext 4/5 ADF/PF Musculoskeletal: remains  tender with movement of limbs/ left arm in splint. Both legs with swelling. Mild swelling left hand.     Assessment/Plan: 1. Functional deficits which require 3+ hours per day of interdisciplinary therapy in a comprehensive inpatient rehab setting. Physiatrist is providing close team supervision and 24 hour management of active medical problems listed below. Physiatrist and rehab team continue to assess barriers to discharge/monitor patient progress toward functional and medical goals  Care Tool:  Bathing    Body parts bathed by patient: Left arm, Chest, Abdomen, Right upper leg, Left upper leg, Face   Body parts bathed by helper: Right arm, Front perineal area, Buttocks, Right lower leg, Left lower leg     Bathing assist Assist Level: 2 Helpers     Upper Body Dressing/Undressing Upper body dressing   What is the patient wearing?: Pull over shirt    Upper body assist Assist Level: Moderate Assistance - Patient 50 - 74%    Lower Body Dressing/Undressing Lower body dressing      What is the patient wearing?: Incontinence brief     Lower body assist Assist for lower body dressing: Maximal Assistance - Patient 25 - 49%  Toileting Toileting    Toileting assist Assist for toileting: Total Assistance - Patient < 25%     Transfers Chair/bed transfer  Transfers assist  Chair/bed transfer activity did not occur: Safety/medical concerns  Chair/bed transfer assist level: Moderate Assistance - Patient 50 - 74%     Locomotion Ambulation   Ambulation assist   Ambulation activity did not occur: Safety/medical concerns (NWB RLE)          Walk 10 feet activity   Assist  Walk 10 feet activity did not occur: Safety/medical concerns (NWB RLE)        Walk 50 feet activity   Assist Walk 50 feet with 2 turns activity did not occur: Safety/medical concerns (NWB RLE)         Walk 150 feet activity   Assist Walk 150 feet activity did not occur:  Safety/medical concerns (NWB RLE)         Walk 10 feet on uneven surface  activity   Assist Walk 10 feet on uneven surfaces activity did not occur: Safety/medical concerns (NWB RLE)         Wheelchair     Assist Will patient use wheelchair at discharge?: Yes Type of Wheelchair: Manual Wheelchair activity did not occur: Safety/medical concerns         Wheelchair 50 feet with 2 turns activity    Assist    Wheelchair 50 feet with 2 turns activity did not occur: Safety/medical concerns       Wheelchair 150 feet activity     Assist  Wheelchair 150 feet activity did not occur: Safety/medical concerns       Blood pressure (!) 141/91, pulse 83, temperature 98 F (36.7 C), temperature source Oral, resp. rate 18, height '5\' 8"'$  (1.727 m), weight 82.7 kg, SpO2 98 %. Medical Problem List and Plan: 1.  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  Antithrombotics: -DVT/anticoagulation:  Pharmaceutical: Other (comment) Eliquis             -antiplatelet therapy:  N/A 3. Pain Management: Oxycodone  prn  -pt prefers not to use oxydodone  - scheduled tramadol has been effective so far  -encouraged used of kpad -scheduled robaxin qid, ice 4. Mood/sleep:  LCSW to follow for evaluation and support.  -melatonin/trazodone             -antipsychotic agents:  seroquel 5. Neuropsych: This patient is almost capable of making decisions on her own behalf. 6. Skin/Wound Care: Routine pressure relief measures.              -continue local wound care as needed to multiple op/injury sites Wet to dry Left upper achilles area  7. Fluids/Electrolytes/Nutrition: Monitor I/O. Check lytes in am. 8. Left humerus Fx and Left BBFx s/p ORIF: OK to WB on left elbow. --ROM as tolerated.  9.  Right acetabular /Femur Fx s/p ORIF: NWB RLE with R-THR precautions 10. Open left femur Fx s/p ORIF, left acetab fx and SPR/IPR fx's: WBAT LLE for transfers only. 11. L-open BBFA Fx s/p ORIF: NWB on left wrist 12. Acute blood loss anemia: Recheck CBC Monday. 13. AKI: Encourage fluid intake as getting dry.  14.  PTSD/Insomnia: added prazosin as well as melatonin to help with sleep.             --monitor for now.seems to be in good spirits, has supportive family             --reduced seroquel to bid----wean off this week             -neuropsych f/u while here 15. Urinary retention: Monitor voiding with PVR checks. Volumes less than 350cc so far             --continue voiding trial, oob when possible.  -continue urecholine  -has had mild hematuria, urine sl cloudy---does appear to be clearing  -afebrile, labs tomorrow 16. HTN: Monitor BP tid--has been variable. Vitals:   06/24/21 1919 06/25/21 0334  BP: 129/80 (!) 141/91  Pulse: (!) 107 83  Resp: 18 18  Temp: 98.8 F (37.1 C) 98 F (36.7 C)  SpO2: 98% 98%    17. Bowels: large bm this morning      LOS: 4 days A FACE TO FACE EVALUATION WAS PERFORMED  Charlett Blake 06/25/2021, 9:50 AM

## 2021-06-25 NOTE — Plan of Care (Signed)
  Problem: Consults Goal: RH GENERAL PATIENT EDUCATION Description: See Patient Education module for education specifics. Outcome: Progressing Goal: Skin Care Protocol Initiated - if Braden Score 18 or less Description: If consults are not indicated, leave blank or document N/A Outcome: Progressing Goal: Nutrition Consult-if indicated Outcome: Progressing   Problem: RH BOWEL ELIMINATION Goal: RH STG MANAGE BOWEL WITH ASSISTANCE Description: STG Manage Bowel with Assistance. min Outcome: Progressing   Problem: RH PAIN MANAGEMENT Goal: RH STG PAIN MANAGED AT OR BELOW PT'S PAIN GOAL Description: Less than 4 Outcome: Progressing   Problem: RH BLADDER ELIMINATION Goal: RH STG MANAGE BLADDER WITH MEDICATION WITH ASSISTANCE Description: STG Manage Bladder With Medication With Mod I Assistance. Outcome: Progressing   Problem: RH SKIN INTEGRITY Goal: RH STG MAINTAIN SKIN INTEGRITY WITH ASSISTANCE Description: STG Maintain Skin Integrity With Mod I Assistance. Outcome: Progressing Goal: RH STG ABLE TO PERFORM INCISION/WOUND CARE W/ASSISTANCE Description: STG Able To Perform Incision/Wound Care With Mod I Assistance. Outcome: Progressing   Problem: RH SAFETY Goal: RH STG DECREASED RISK OF FALL WITH ASSISTANCE Description: STG Decreased Risk of Fall With Mod I Assistance. Outcome: Progressing

## 2021-06-25 NOTE — Progress Notes (Signed)
Physical Therapy Session Note  Patient Details  Name: Melissa Harrington MRN: [id number] Date of Birth: [birth year]  Today's Date: 06/25/2021 PT Individual Time: 1430-1526 PT Individual Time Calculation (min): 56 min   Short Term Goals: Week 1:  PT Short Term Goal 1 (Week 1): Pt will complete supine to sit transfer to EOB with Mod Assist PT Short Term Goal 2 (Week 1): Pt will complete a sit to stand transfer with Mod Assist in setting of weight bearing precautions PT Short Term Goal 3 (Week 1): Pt will tolerate 30 mins of therapy without fatigue  Skilled Therapeutic Interventions/Progress Updates:   Received pt sitting in TIS WC with daughter present at bedside, pt agreeable to PT treatment and reported pain in bilateral hips and along incisions on LLE>RLE (premedicated). Repositioning, rest breaks, and distraction done to reduce pain levels. Session with emphasis on functional mobility/transfers, generalized strengthening and ROM, and improved activity tolerance. Per OT request, therapist adjusted headrest and legrests of TIS WC and transported to to dayroom in TIS WC total A. Placed step underneath LEs for support and pt performed the following exercises sitting in TIS WC supervision and verbal/visual cues for technique with emphasis on LE strengthening and ROM: -knee extension 2x10 bilaterally -hip flexion 2x10 bilaterally -hip adduction pillow squeezes 2x10 with 3 second isometric hold Pt transported back to room in TIS WC total A and transferred TIS WC<>bed via slideboard with mod A +2 with total A to place board and 6in step supporting feet. Pt required cues for anterior weight shifting, head/hips relationship, and to maintain RLE NWB status. Doffed scrub top and donned clean shirt with min A. Sit<>supine with mod A +2 and scooted to [facility] with min A +2 and use of Trendelenburg bed position with pt assisting pulling with RUE on headboard. Rolled L/R with mod A and doffed pants with +2 assist for  time management purposes. Concluded session with pt semi-reclined in bed, needs within reach, and bed alarm on. Pillows positioned underneath LE's for support.   Therapy Documentation Precautions:  Precautions Precautions: Posterior Hip Precaution Comments: vaginal bleeding from foley trauma Restrictions Weight Bearing Restrictions: Yes RUE Weight Bearing: Weight bearing as tolerated LUE Weight Bearing: Weight bear through elbow only RLE Weight Bearing: Non weight bearing LLE Weight Bearing: Weight bearing as tolerated  Therapy/Group: Individual Therapy Alfonse Alpers PT, DPT   06/25/2021, 7:22 AM

## 2021-06-25 NOTE — Progress Notes (Signed)
Speech Language Pathology Daily Session Note  Patient Details  Name: Melissa Harrington MRN: [id number] Date of Birth: 03/10/1971  Today's Date: 06/25/2021 SLP Individual Time: 1100-1145 SLP Individual Time Calculation (min): 45 min  Short Term Goals: Week 1: SLP Short Term Goal 1 (Week 1): Pt will tolerate Dys 3/thin diet with no overt s/s aspiration with supervision level cues for use of strategies SLP Short Term Goal 2 (Week 1): Pt will participate in functional alternating attention tasks with mod A cues SLP Short Term Goal 3 (Week 1): Pt will complete mildly complex problem solving tasks with min A for 90% accuracy SLP Short Term Goal 4 (Week 1): Pt will complete medication and money management tasks in low stim environment with mod A SLP Short Term Goal 5 (Week 1): Pt will recall functional, novel information with 80% accuracy provided mod A cues for use of compensatory strategies  Skilled Therapeutic Interventions: Pt seen for skilled ST with focus on cognitive goals. Pt deferring med management task this date, able to recall activity from previous session with 100% accuracy. SLP facilitating alternating attention task by providing min A cues for 100% accuracy. Pt completing deductive reasoning task with encouragement to complete, states she did not particularly enjoy the task. Pt continues to endorse feeling "foggy", encouraged family to bring in books/novels/bible for patient to read in spare time (does not like word searches or crosswords). Pt legs placed on pillows per preference, left in bed with alarm set and all needs within reach. Cont ST POC.   Pain Pain Assessment Pain Scale: 0-10 Pain Score: 3  Pain Location: Leg  Therapy/Group: Individual Therapy  Dewaine Conger 06/25/2021, 11:43 AM

## 2021-06-25 NOTE — Progress Notes (Signed)
Occupational Therapy Session Note  Patient Details  Name: Melissa Harrington MRN: [id number] Date of Birth: 1971-01-21  Today's Date: 06/25/2021 OT Individual Time: 1302-1402 OT Individual Time Calculation (min): 60 min    Short Term Goals: Week 1:  OT Short Term Goal 1 (Week 1): Pt will complete BSC transfer with +2 assist OT Short Term Goal 2 (Week 1): Pt will participate in 1 OT session while OOB for ~45 minutes to increase OOB tolerance OT Short Term Goal 3 (Week 1): Pt will complete 1 grooming task with Mod A using adaptive strategies  Skilled Therapeutic Interventions/Progress Updates:    Pt greeted semi-reclined in bed with daughter present and agreeable to OT treatment session. Pt wanted to don shorts. Rolling in bed with pt directing care and mod A, but total A to thread pants and pull them up. Educated on bed mobility technique with mod A. Slideboard transfer bed>TIS wc with max A and +2 for positioning and board placement. OT used tilt function of wc to scoot pt further back in wc. Pt agreeable to wash her hair today. Wc head rest removed and pt able to maintain grasp on wc wash tray handle while OT assisted with washing hair. Pt very grateful to have her hair washed. Had pt try to brush her hair, but needed OT assist to avoid wound on the back of her head. Pt also did not want to look at herself in the mirror. Pt left in care of daughter to blow dry her hair and await next therapy session.   Therapy Documentation Precautions:  Precautions Precautions: Posterior Hip Precaution Comments: vaginal bleeding from foley trauma Restrictions Weight Bearing Restrictions: Yes RUE Weight Bearing: Weight bearing as tolerated LUE Weight Bearing: Weight bear through elbow only RLE Weight Bearing: Non weight bearing LLE Weight Bearing: Weight bearing as tolerated  Pain: Pain Assessment Pain Scale: 0-10 Pain Score: 3  Pain Location: Leg Rest and repositioned for pain  management   Therapy/Group: Individual Therapy  Valma Cava 06/25/2021, 2:07 PM
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
## 2021-06-25 NOTE — Patient Care Conference (Signed)
Inpatient RehabilitationTeam Conference and Plan of Care Update Date: 06/25/2021   Time: 10:24 AM    Patient Name: Melissa Harrington      Medical Record Number: [id number]  Date of Birth: 11/06/1971 Sex: Female         Room/Bed: 4W11C/4W11C-01 Payor Info: Payor: Theme park manager / Plan: Chancellor / Product Type: *No Product type* /    Admit Date/Time:  06/21/2021  6:26 PM  Primary Diagnosis:  TBI (traumatic brain injury) [facility])  Hospital Problems: Principal Problem:   TBI (traumatic brain injury) [facility]) Active Problems:   Critical polytrauma    Expected Discharge Date: Expected Discharge Date:  (3 weeks)  Team Members Present: Physician leading conference: Dr. Leeroy Cha Social Worker Present: Loralee Pacas, [place] Nurse Present: Dorthula Nettles, RN PT Present: Tereasa Coop, PT OT Present: Laverle Hobby, OT SLP Present: Lillie Columbia, SLP PPS Coordinator present : Gunnar Fusi, SLP     Current Status/Progress Goal Weekly Team Focus  Bowel/Bladder   continent of bowel and bladder, LBM 8/7  maintain current bowel and bladder status, PVR within normal limit      Swallow/Nutrition/ Hydration   dys 3 textures and thin liquids, full supervision A likely can fade to intermittent soon  Mod I  tolerance of diet and likely will remain on texture due to oral pain.   ADL's   Doing well cognitively, higher level problem solving/awareness, max A+2 for transfers with slideboard, mod A UB ADLs, total A LB  min A overall  ADLs, transfers, global strengthening, precaution adherence, cognitive retraining   Mobility   maxA bed mobility, modA slideboard transfer to [place]  transfers, increasing activity tolerance   Communication             Safety/Cognition/ Behavioral Observations  Min A  Supervision A  complex problem solving, recall, emergent/anticipatory awarenes and alternating/divided attention   Pain   pain in L arm and R hip/leg  pain<3/10  assess pain Q shift  and PRN; continue scheduled pain med, use of k pad   Skin   surgical incisions with sutures on L arm and bilateral hips/legs open to air; posterior head wound painted with betadine BID; non-healing wound on L lateral leg covered with foam  healing of wounds and prevent infection  assess skin Q shift and PRN     Discharge Planning:  Pt is a recent widow. Pt will have support from her two eldest daughters, and friend [name] that is flying in from [place] to live with them. There will be support from [facility].   Team Discussion: Multiple fx's, not a nursing transfer, bedpan only. Continent B/B, prn pain medication effective. Bilateral sutures CDI. Pain is a barrier. Max +2 with slide board. Mod assist upper body, min assist lower body. Max assist bed mobility, mod assist slide board transfer to [facility]. Supervision for cognition. Patient on target to meet rehab goals: yes  *See Care Plan and progress notes for long and short-term goals.   Revisions to Treatment Plan:  Not at this time.  Teaching Needs: Family education, medication management, pain management, skin/wound care, transfer training, gait training, balance training, endurance training, slide board training, safety awareness.  Current Barriers to Discharge: Decreased caregiver support, Medical stability, Home enviroment access/layout, Wound care, Lack of/limited family support, Weight, Weight bearing restrictions, and Medication compliance  Possible Resolutions to Barriers: Continue current medications, provide emotional support.     Medical Summary  I attest that I was present, lead the team conference, and concur with the assessment and plan of the team.   Cristi Loron 06/25/2021, 5:21 PM

## 2021-06-25 NOTE — Progress Notes (Signed)
Patient ID: Melissa Harrington, female   DOB: Jun 21, 1971, 50 y.o.   MRN: [id number]  SW met with pt and pt dtr [name] in room while pt was finishing OT to provide updates from team conference, and ELOS 3 weeks. Pt dtr [name] is planning when patient's friend [name] will come out. Plans for the week prior to patient discharge so she can learn  how to help patient when she goes home.   Loralee Pacas, MSW, [place] Office: [phone] Cell: [phone] Fax: [phone]

## 2021-06-25 NOTE — Progress Notes (Addendum)
Occupational Therapy Session Note  Patient Details  Name: Melissa Harrington MRN: [id number] Date of Birth: 06-30-1971  Today's Date: 06/25/2021 OT Individual Time: [id number] OT Individual Time Calculation (min): 45 min    Short Term Goals: Week 1:  OT Short Term Goal 1 (Week 1): Pt will complete BSC transfer with +2 assist OT Short Term Goal 2 (Week 1): Pt will participate in 1 OT session while OOB for ~45 minutes to increase OOB tolerance OT Short Term Goal 3 (Week 1): Pt will complete 1 grooming task with Mod A using adaptive strategies  Skilled Therapeutic Interventions/Progress Updates:    Pt semi upright in bed, reports RLE painful and tender but no number provided.  Pt requesting urgent toileting.  Min assist to roll to right using bed rails and total assist to place bedpan.  Pt had continent episode of bowel and bladder.  Total assist for pericare and max assist for brief management.  Mediplex bandage change over sacrum. MD arrived and completed daily assessment. Assessed vitals: all WNL. Pt bathed and dressed UB with max assist at bed level.  Pt set up for oral hygiene at bed level. Call bell in reach, bed alarm on.  Therapy Documentation Precautions:  Precautions Precautions: Posterior Hip Precaution Comments: vaginal bleeding from foley trauma Restrictions Weight Bearing Restrictions: Yes RUE Weight Bearing: Weight bearing as tolerated LUE Weight Bearing: Weight bear through elbow only RLE Weight Bearing: Non weight bearing LLE Weight Bearing: Weight bearing as tolerated    Therapy/Group: Individual Therapy  Ezekiel Slocumb 06/25/2021, 12:22 PM

## 2021-06-26 ENCOUNTER — Inpatient Hospital Stay (HOSPITAL_COMMUNITY): Payer: 59

## 2021-06-26 DIAGNOSIS — F4310 Post-traumatic stress disorder, unspecified: Secondary | ICD-10-CM

## 2021-06-26 DIAGNOSIS — R030 Elevated blood-pressure reading, without diagnosis of hypertension: Secondary | ICD-10-CM

## 2021-06-26 DIAGNOSIS — G8918 Other acute postprocedural pain: Secondary | ICD-10-CM

## 2021-06-26 DIAGNOSIS — E876 Hypokalemia: Secondary | ICD-10-CM

## 2021-06-26 DIAGNOSIS — T07XXXA Unspecified multiple injuries, initial encounter: Secondary | ICD-10-CM

## 2021-06-26 DIAGNOSIS — S069X0D Unspecified intracranial injury without loss of consciousness, subsequent encounter: Secondary | ICD-10-CM

## 2021-06-26 LAB — BASIC METABOLIC PANEL
Anion gap: 7 (ref 5–15)
BUN: 5 mg/dL — ABNORMAL LOW (ref 6–20)
CO2: 26 mmol/L (ref 22–32)
Calcium: 9.3 mg/dL (ref 8.9–10.3)
Chloride: 102 mmol/L (ref 98–111)
Creatinine, Ser: 0.54 mg/dL (ref 0.44–1.00)
GFR, Estimated: >60 mL/min (ref >60)
Glucose, Bld: 119 mg/dL — ABNORMAL HIGH (ref 70–99)
Potassium: 3.3 mmol/L — ABNORMAL LOW (ref 3.5–5.1)
Sodium: 135 mmol/L (ref 135–145)

## 2021-06-26 MED ORDER — POTASSIUM CHLORIDE 20 MEQ PO PACK
20.0000 meq | PACK | Freq: Two times a day (BID) | ORAL | Status: AC
  Administered 2021-06-26 – 2021-06-27 (×4): 20 meq via ORAL
  Filled 2021-06-26 (×4): qty 1

## 2021-06-26 NOTE — Progress Notes (Signed)
Occupational Therapy Session Note  Patient Details  Name: Melissa Harrington MRN: [id number] Date of Birth: [birth year]  Today's Date: 06/26/2021 OT Individual Time: [id number] OT Individual Time Calculation (min): 43 min    Short Term Goals: Week 1:  OT Short Term Goal 1 (Week 1): Pt will complete BSC transfer with +2 assist OT Short Term Goal 2 (Week 1): Pt will participate in 1 OT session while OOB for ~45 minutes to increase OOB tolerance OT Short Term Goal 3 (Week 1): Pt will complete 1 grooming task with Mod A using adaptive strategies   Skilled Therapeutic Interventions/Progress Updates:    Pt greeted at time of session in bed resting with family member in room who remained throughout session. No increase in pain, pt stating she is constantly sore but no increase in pain reported. Declined all OOB/transfer activity requesting to do bed level activity only. Supine > sit Min/Mod for LE management and later in session Mod A for sit > supine. Pt able to direct care, state and adhere to precautions throughout session. Sitting EOB, BP 137/93 and no dizziness. BUE there ex w/ 2# dumbbell for RUE only and some ROM for LUE no weight to tolerance, bicep curl, chest press, overhead press for 1x15. Gentle AAROM for LUE performed at shoulder and elbow to tolerance and within restrictions. Knee extension/kick outs for 1x10 each with visual target. Pt returned to supine and alarm on call bell in reach.   Therapy Documentation Precautions:  Precautions Precautions: Posterior Hip Precaution Comments: vaginal bleeding from foley trauma Restrictions Weight Bearing Restrictions: Yes RUE Weight Bearing: Weight bearing as tolerated LUE Weight Bearing: Weight bear through elbow only RLE Weight Bearing: Non weight bearing LLE Weight Bearing: Weight bearing as tolerated (for transfers only)     Therapy/Group: Individual Therapy  Viona Gilmore 06/26/2021, 12:47 PM

## 2021-06-26 NOTE — Progress Notes (Signed)
Speech Language Pathology TBI Note  Patient Details  Name: Melissa Harrington MRN: [id number] Date of Birth: Jan 01, 1971  Today's Date: 06/26/2021 SLP Individual Time: [id number] SLP Individual Time Calculation (min): 40 min  Short Term Goals: Week 1: SLP Short Term Goal 1 (Week 1): Pt will tolerate Dys 3/thin diet with no overt s/s aspiration with supervision level cues for use of strategies SLP Short Term Goal 2 (Week 1): Pt will participate in functional alternating attention tasks with mod A cues SLP Short Term Goal 3 (Week 1): Pt will complete mildly complex problem solving tasks with min A for 90% accuracy SLP Short Term Goal 4 (Week 1): Pt will complete medication and money management tasks in low stim environment with mod A SLP Short Term Goal 5 (Week 1): Pt will recall functional, novel information with 80% accuracy provided mod A cues for use of compensatory strategies  Skilled Therapeutic Interventions: Skilled treatment session focused on cognitive goals. Upon arrival, patient was awake and continues to report intermittent "brain fog." SLP discussed functional tasks that she performs in her daily life and while at work. Patient reported that she would like to focus on reading. Patient read at the paragraph level with extra time and intermittent reports of double vision. Patient also reports decreased short-term memory with intermittent repetition of information to visitors, however, patient appeared to demonstrate appropriate carryover of functional information., especially as it related to pain medications. Patient left upright in bed with aunt present. Continue with current plan of care.       Pain Mild pain in right foot, patient provided an ice pack  Agitated Behavior Scale: TBI Observation Details Observation Environment: Patient's room Start of observation period - Date: 06/26/21 Start of observation period - Time: 1400 End of observation period - Date: 06/26/21 End of  observation period - Time: 1440 Agitated Behavior Scale (DO NOT LEAVE BLANKS) Short attention span, easy distractibility, inability to concentrate: Absent Impulsive, impatient, low tolerance for pain or frustration: Absent Uncooperative, resistant to care, demanding: Absent Violent and/or threatening violence toward people or property: Absent Explosive and/or unpredictable anger: Absent Rocking, rubbing, moaning, or other self-stimulating behavior: Absent Pulling at tubes, restraints, etc.: Absent Wandering from treatment areas: Absent Restlessness, pacing, excessive movement: Absent Repetitive behaviors, motor, and/or verbal: Absent Rapid, loud, or excessive talking: Absent Sudden changes of mood: Absent Easily initiated or excessive crying and/or laughter: Absent Self-abusiveness, physical and/or verbal: Absent Agitated behavior scale total score: 14  Therapy/Group: Individual Therapy  Melissa Harrington 06/26/2021, 3:37 PM

## 2021-06-26 NOTE — Progress Notes (Signed)
Occupational Therapy TBI Note  Patient Details  Name: Melissa Harrington MRN: [id number] Date of Birth: 1971-03-12  Today's Date: 06/26/2021 OT Individual Time: 1300-1400 OT Individual Time Calculation (min): 60 min    Short Term Goals: Week 1:  OT Short Term Goal 1 (Week 1): Pt will complete BSC transfer with +2 assist OT Short Term Goal 2 (Week 1): Pt will participate in 1 OT session while OOB for ~45 minutes to increase OOB tolerance OT Short Term Goal 3 (Week 1): Pt will complete 1 grooming task with Mod A using adaptive strategies  Skilled Therapeutic Interventions/Progress Updates:    Pt received sitting in TIS w/c with her aunt present, c/o moderate pain in her R LE. She was agreeable to ADLs at the sink and then transfer to [facility] Grooming with set up assist. Min A for UB ADLs- improved ROM in her L shoulder and improved ability to bear weight through therapist provided elbow assist. Slideboard transfer from TIS > bariatric BSC with only min A toward the L! Pt required max A for clothing management with lateral leans. Mod cueing for adherence to R posterior hip precautions. Chuck pad placed on slideboard to facilitate transfer back to bed from [facility] without LB clothing on. Max A required for this transfer with pt reporting increase in fatigue and pain. Mod A to manage BLE to return to bed. Pt rolled R and L with mod A for new incontinence brief placement. [place] placed on towel to create halo for pt to keep her posterior head wound off the bed for maximal healing. Pt was passed off to SLP in room.   Therapy Documentation Precautions:  Precautions Precautions: Posterior Hip Precaution Comments: vaginal bleeding from foley trauma Restrictions Weight Bearing Restrictions: Yes RUE Weight Bearing: Weight bearing as tolerated LUE Weight Bearing: Weight bear through elbow only RLE Weight Bearing: Non weight bearing LLE Weight Bearing: Weight bearing as tolerated (for transfers only)     Agitated Behavior Scale: TBI Observation Details Observation Environment: pt room Start of observation period - Date: 06/26/21 Start of observation period - Time: 1300 End of observation period - Date: 06/26/21 End of observation period - Time: 1400 Agitated Behavior Scale (DO NOT LEAVE BLANKS) Short attention span, easy distractibility, inability to concentrate: Absent Impulsive, impatient, low tolerance for pain or frustration: Absent Uncooperative, resistant to care, demanding: Absent Violent and/or threatening violence toward people or property: Absent Explosive and/or unpredictable anger: Absent Rocking, rubbing, moaning, or other self-stimulating behavior: Absent Pulling at tubes, restraints, etc.: Absent Wandering from treatment areas: Absent Restlessness, pacing, excessive movement: Absent Repetitive behaviors, motor, and/or verbal: Absent Rapid, loud, or excessive talking: Absent Sudden changes of mood: Absent Easily initiated or excessive crying and/or laughter: Absent Self-abusiveness, physical and/or verbal: Absent Agitated behavior scale total score: 14    Therapy/Group: Individual Therapy  Curtis Sites 06/26/2021, 3:08 PM

## 2021-06-26 NOTE — Progress Notes (Signed)
PROGRESS NOTE   Subjective/Complaints: Patient seen sitting up in her chair this morning.  She states she slept well overnight.  She notes her pain is manageable.  ROS: Denies CP, SOB, N/V/D  Objective:   DG Forearm Left  Result Date: 06/26/2021 CLINICAL DATA:  Fracture. EXAM: LEFT FOREARM - 2 VIEW COMPARISON:  June 04, 2021. FINDINGS: The left forearm has been splinted and immobilized. Status post surgical internal fixation of old distal left radial and ulnar fractures. Stable line mint of fracture components is noted. No acute abnormality is noted. IMPRESSION: Postsurgical changes as described above. Electronically Signed   By: Marijo Conception M.D.   On: 06/26/2021 11:28   DG Wrist Complete Left  Result Date: 06/26/2021 CLINICAL DATA:  Fracture. EXAM: LEFT WRIST - COMPLETE 3+ VIEW COMPARISON:  June 04, 2021. FINDINGS: The left wrist is splinted and immobilized. Status post surgical internal fixation of old distal left radial and ulnar fractures. Stable alignment of the fracture components is noted. No acute abnormality is noted. IMPRESSION: Stable postsurgical changes as described above. Electronically Signed   By: Marijo Conception M.D.   On: 06/26/2021 11:24   DG Knee 1-2 Views Right  Result Date: 06/26/2021 CLINICAL DATA:  Fracture. EXAM: RIGHT KNEE - 1-2 VIEW COMPARISON:  May 27, 2021. FINDINGS: Status post surgical internal fixation of comminuted distal right humeral shaft fracture. Stable alignment of fracture components is noted. IMPRESSION: Stable postsurgical changes as described above. Electronically Signed   By: Marijo Conception M.D.   On: 06/26/2021 11:26   DG Pelvis Comp Min 3V  Result Date: 06/26/2021 CLINICAL DATA:  Pelvic fracture. EXAM: JUDET PELVIS - 3+ VIEW COMPARISON:  June 04, 2021. FINDINGS: Status post surgical internal fixation of right acetabular fracture. Status post surgical internal fixation of proximal left  femoral fracture. No acute fracture or dislocation is noted. Sacroiliac joints are unremarkable. IMPRESSION: Postsurgical changes as described above. No acute abnormality is noted. Electronically Signed   By: Marijo Conception M.D.   On: 06/26/2021 11:23   DG Humerus Left  Result Date: 06/26/2021 CLINICAL DATA:  Fracture. EXAM: LEFT HUMERUS - 2+ VIEW COMPARISON:  June 04, 2021. FINDINGS: Status post surgical internal fixation of mid humeral shaft fracture. Persistent fracture line is noted, although there does appear to be increased callus formation suggesting healing. IMPRESSION: Status post surgical internal fixation of healing mid humeral shaft fracture. Electronically Signed   By: Marijo Conception M.D.   On: 06/26/2021 11:27   DG FEMUR MIN 2 VIEWS LEFT  Result Date: 06/26/2021 CLINICAL DATA:  Fracture. EXAM: LEFT FEMUR 2 VIEWS COMPARISON:  May 27, 2021. FINDINGS: Status post intramedullary rod fixation of comminuted and displaced fracture involving the left femoral shaft. Stable alignment of fracture components is noted. Also noted is nondisplaced fracture involving the subtrochanteric region of the proximal left femur which is unchanged. IMPRESSION: Stable postsurgical and posttraumatic findings as described above. Electronically Signed   By: Marijo Conception M.D.   On: 06/26/2021 11:35   DG FEMUR, MIN 2 VIEWS RIGHT  Result Date: 06/26/2021 CLINICAL DATA:  Fracture. EXAM: RIGHT FEMUR 2 VIEWS COMPARISON:  May 27, 2021.  May 25, 2021. FINDINGS: Status post surgical internal fixation of distal right femoral shaft fracture. Stable alignment of fracture components is noted. Intra-articular extension of the fracture is noted to the intercondylar notch of the distal femur which appears to been present on prior exam. IMPRESSION: Stable postsurgical and posttraumatic findings as described above. Electronically Signed   By: Marijo Conception M.D.   On: 06/26/2021 11:33   Recent Labs    06/24/21 0542  WBC 5.0   HGB 10.7*  HCT 33.1*  PLT 227    Recent Labs    06/24/21 0542 06/26/21 0506  NA 136 135  K 3.3* 3.3*  CL 101 102  CO2 26 26  GLUCOSE 126* 119*  BUN 6 5*  CREATININE 0.53 0.54  CALCIUM 9.2 9.3     Intake/Output Summary (Last 24 hours) at 06/26/2021 1247 Last data filed at 06/26/2021 0830 Gross per 24 hour  Intake 590 ml  Output --  Net 590 ml      Pressure Injury 06/10/21 Head Lower;Posterior Stage 1 -  Intact skin with non-blanchable redness of a localized area usually over a bony prominence. posterior head; difficult to assess d/t matted hair; approximately 3cm x 3cm? (Active)  06/10/21 0830  Location: Head  Location Orientation: Lower;Posterior  Staging: Stage 1 -  Intact skin with non-blanchable redness of a localized area usually over a bony prominence.  Wound Description (Comments): posterior head; difficult to assess d/t matted hair; approximately 3cm x 3cm?  Present on Admission: No    Physical Exam: Vital Signs Blood pressure (!) 143/89, pulse 87, temperature 98.1 F (36.7 C), temperature source Oral, resp. rate 18, height '5\' 8"'$  (1.727 m), weight 82.7 kg, SpO2 97 %. Constitutional: No distress . Vital signs reviewed. HENT: Normocephalic.  Abrasions Eyes: EOMI. No discharge. Cardiovascular: No JVD.  RRR. Respiratory: Normal effort.  No stridor.  Bilateral clear to auscultation. GI: Non-distended.  BS +. Skin: Warm and dry.  Scattered abrasions with sutures. Psych: Normal mood.  Normal behavior. Musc: Scattered areas of tenderness and edema in extremities. Neuro: Alert and oriented Motor- 5/5 in RUE,  LUE: Shoulder abduction 2+/5, distally limited by splint  RLE 3- Hip and knee ext 4/5 ADF/PF LLE 3+/5 hip and knee ext 4/5 ADF/PF  Assessment/Plan: 1. Functional deficits which require 3+ hours per day of interdisciplinary therapy in a comprehensive inpatient rehab setting. Physiatrist is providing close team supervision and 24 hour management of active  medical problems listed below. Physiatrist and rehab team continue to assess barriers to discharge/monitor patient progress toward functional and medical goals  Care Tool:  Bathing    Body parts bathed by patient: Left arm, Chest, Abdomen, Right upper leg, Left upper leg, Face   Body parts bathed by helper: Right arm, Front perineal area, Buttocks, Right lower leg, Left lower leg     Bathing assist Assist Level: 2 Helpers     Upper Body Dressing/Undressing Upper body dressing   What is the patient wearing?: Pull over shirt    Upper body assist Assist Level: Moderate Assistance - Patient 50 - 74%    Lower Body Dressing/Undressing Lower body dressing      What is the patient wearing?: Incontinence brief     Lower body assist Assist for lower body dressing: Maximal Assistance - Patient 25 - 49%     Toileting Toileting    Toileting assist Assist for toileting: Total Assistance - Patient < 25%     Transfers Chair/bed transfer  Transfers  assist  Chair/bed transfer activity did not occur: Safety/medical concerns  Chair/bed transfer assist level: 2 Helpers     Locomotion Ambulation   Ambulation assist   Ambulation activity did not occur: Safety/medical concerns (NWB RLE)          Walk 10 feet activity   Assist  Walk 10 feet activity did not occur: Safety/medical concerns (NWB RLE)        Walk 50 feet activity   Assist Walk 50 feet with 2 turns activity did not occur: Safety/medical concerns (NWB RLE)         Walk 150 feet activity   Assist Walk 150 feet activity did not occur: Safety/medical concerns (NWB RLE)         Walk 10 feet on uneven surface  activity   Assist Walk 10 feet on uneven surfaces activity did not occur: Safety/medical concerns (NWB RLE)         Wheelchair     Assist Will patient use wheelchair at discharge?: Yes Type of Wheelchair: Manual Wheelchair activity did not occur: Safety/medical concerns          Wheelchair 50 feet with 2 turns activity    Assist    Wheelchair 50 feet with 2 turns activity did not occur: Safety/medical concerns       Wheelchair 150 feet activity     Assist  Wheelchair 150 feet activity did not occur: Safety/medical concerns       Blood pressure (!) 143/89, pulse 87, temperature 98.1 F (36.7 C), temperature source Oral, resp. rate 18, height '5\' 8"'$  (1.727 m), weight 82.7 kg, SpO2 97 %. Medical Problem List and Plan: 1.  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  Antithrombotics: -DVT/anticoagulation:  Pharmaceutical: Other (comment) Eliquis             -antiplatelet therapy:  N/A 3. Pain Management: Oxycodone  prn  -pt prefers not to use oxydodone  - scheduled tramadol has been effective so far  -encouraged used of kpad -scheduled robaxin qid, ice Controlled with meds on 8/10 4. Mood/sleep:  LCSW to follow for evaluation and support.  -melatonin/trazodone             -antipsychotic agents:  seroquel 5. Neuropsych: This patient is almost capable of making decisions on her own behalf. 6. Skin/Wound Care: Routine pressure relief measures.              -continue local wound care as needed to multiple op/injury sites Wet to dry Left upper achilles area  7. Fluids/Electrolytes/Nutrition: Monitor I/Os. 8. Left humerus Fx and Left BBFx s/p ORIF: OK to WB on left elbow. --ROM as tolerated.  9. Right acetabular /Femur Fx s/p ORIF: NWB RLE with R-THR precautions 10. Open left femur Fx s/p ORIF, left acetab fx and SPR/IPR fx's: WBAT LLE for transfers only. 11. L-open BBFA Fx s/p ORIF: NWB on left wrist 12. Acute blood loss anemia:   Hemoglobin 10.7 on 8/8  Continue to monitor 13. AKI: Resolved 14. PTSD/Insomnia: added prazosin as well as melatonin to help with sleep.             --monitor for now.seems to be in good spirits, has supportive family             --continue Seroquel for now              -neuropsych f/u while here 15. Urinary retention: Monitor voiding with PVR checks. Volumes less than 350cc  so far             --continue voiding trial, oob when possible.  -continue urecholine 16. HTN: Monitor BP tid--has been variable. Vitals:   06/25/21 1916 06/26/21 0409  BP: (!) 141/93 (!) 143/89  Pulse: (!) 103 87  Resp: 18 18  Temp: 98.6 F (37 C) 98.1 F (36.7 C)  SpO2: 98% 97%   Borderline on 8/10  Continue to monitor 17.  Hypokalemia  Potassium 3.3 on 8/10  Supplemented x2 days on 8/10  Continue to monitor   LOS: 5 days A FACE TO FACE EVALUATION WAS PERFORMED  Jacksen Isip Lorie Phenix 06/26/2021, 12:47 PM

## 2021-06-26 NOTE — Progress Notes (Signed)
Physical Therapy Session Note  Patient Details  Name: Melissa Harrington MRN: [id number] Date of Birth: 1971-09-04  Today's Date: 06/26/2021 PT Individual Time: 1000-1058 PT Individual Time Calculation (min): 58 min   Short Term Goals: Week 1:  PT Short Term Goal 1 (Week 1): Pt will complete supine to sit transfer to EOB with Mod Assist PT Short Term Goal 2 (Week 1): Pt will complete a sit to stand transfer with Mod Assist in setting of weight bearing precautions PT Short Term Goal 3 (Week 1): Pt will tolerate 30 mins of therapy without fatigue  Skilled Therapeutic Interventions/Progress Updates: Pt presents supine in bed and agreeable to therapy.  Pt required Total A for donning TED hose and slipper socks.  Pt able to bring LEs off EOB w/ time  and then mod A to bring UB off bed and to EOB.  Pt encouraged to use L elbow to push to sitting w/ breathing technique encouraged.  Pt required mod A to square up at EOB.  Pt states dizziness and then dissipated after 10 seconds.  Pt again encouraged to maintain breathing.  Pt educated on use and placement of SB.  Pt able to lean to L elbow and partially lift RLE, but PT performed total A to place SB.  6" platform placed for height of bed to improve transfer.  Pt utilized PT UE forearm for WB through L elbow and then performed sequential squat pivot/SB transfer w/ A of 2.  Pt able to maintain NWB RLE.  Pt able to complete scoot into center of seat after PT removed SB.  Pt then scooted self back into TIS once reclined.  PT wheeled to [place].  Pt performed AP, LAQ, hip flexion (AAROM), and isometric add 3 x 10-15.  Pt returned to room and reclined in TIS w/ seat belt on, all needs in reach, w/ friend remaining in room.     Therapy Documentation Precautions:  Precautions Precautions: Posterior Hip Precaution Comments: vaginal bleeding from foley trauma Restrictions Weight Bearing Restrictions: Yes RUE Weight Bearing: Weight bearing as tolerated LUE Weight  Bearing: Weight bear through elbow only RLE Weight Bearing: Non weight bearing LLE Weight Bearing: Weight bearing as tolerated (for transfers only) General:   Vital Signs:  Pain:2/10 mostly RLE, pain meds given earlier. Pain Assessment Pain Scale: 0-10 Pain Score: 2  Faces Pain Scale: Hurts a little bit Pain Location: Leg Pain Orientation: Right Mobility:       Therapy/Group: Individual Therapy  Ladoris Gene 06/26/2021, 10:59 AM

## 2021-06-27 NOTE — Progress Notes (Addendum)
Physical Therapy Session Note  Patient Details  Name: Melissa Harrington MRN: [id number] Date of Birth: [birth year]  Today's Date: 06/27/2021 PT Individual Time: [id number] and 1130-1200 PT Individual Time Calculation (min): 55 min  and 30 min  Short Term Goals: Week 1:  PT Short Term Goal 1 (Week 1): Pt will complete supine to sit transfer to EOB with Mod Assist PT Short Term Goal 2 (Week 1): Pt will complete a sit to stand transfer with Mod Assist in setting of weight bearing precautions PT Short Term Goal 3 (Week 1): Pt will tolerate 30 mins of therapy without fatigue Week 2:     Skilled Therapeutic Interventions/Progress Updates:   Pain:  Pt reports r sided low back pain in sitting.  Pt transferred to bed for rest break.  Treatment to tolerance.  Rest breaks and repositioning as needed.  Pt initially oob in wc, requested midday break , reports being up since early am.  Agreeable to treatment session w/focus on transfer training, ROM. Daughter, Barnetta Chapel, in room and participates in session.  Pt performed wc to bed via sliding board transfer w/following assist: Total assist for board placement, pt able to lean to L using L elbow only for placement of board by therapist.   5in stool under L foot due to height of wc Daughter elevating RLE to prevent wbing,  Pt boosts using RUE/LLE Min tactile cues for wtshifting w/task. Total assist for removal of board  In sitting: LAQs AROM x 12 each, maximizes AROM flexion and extension  Sit to supine w/max assist to manage Lees  In supine: Clamshells  AAROM hip abd/add Hip ER w/return to neutral (knees extended)  During therex, pt educated re: acetabulum - basic anatomy/location of theses fxs, importance of wbing precautions for healing.  Pt left supine w/rails up x 4, alarm set, bed in lowest position, and needs in reach.   PM SESSION PAIN Continues to c/o low back pain but states rest in bed did ease.  Adustements made to seating to  address.  Will continue efforts to improve comfort when OOB.  Pt supine to sit w/min assist, additional time, cues for wbing precautions. Daughters both in room and observed/educated regarding sequencing/set up/encouraged pt w/transfer to wc. sliding board transfer w/max assist for board palcement/pt leans to R, use of step stool, cga, cues for wt shifting and sequencing. Pt transported to gym.  Attempted to tilt solid seat back but this was not possible adjustment.  Decided to trial Ulice Dash 2 cushion vs roho for improved pelvic alignment, ease of transfers. Pt transferred wc to mat sliding board transfer as above to R side, cga, cues.  In sitting: RUE diagonal w/RUE L hip to R 90/90 x 10 reps Seated recline to upright for abdominal strengthening L forward reach to exension/retreaction x 10 Shoulder shrugs Shoulder circles  Therapist obtained Jay 2. Mat to wc as above, slight uphill surface for advanced training, min assist required, therapist prevented backslinding w/transfer.  At end of session, pt transported to room.  Handed off to daughters who were trained w/locking/tilting wc safely.   AGITATED BEHAVIOR SCALE: 14    Therapy Documentation Precautions:  Precautions Precautions: Posterior Hip Precaution Comments: vaginal bleeding from foley trauma Restrictions Weight Bearing Restrictions: Yes RUE Weight Bearing: Weight bearing as tolerated LUE Weight Bearing: Weight bear through elbow only RLE Weight Bearing: Non weight bearing LLE Weight Bearing: Weight bearing as tolerated    Therapy/Group: Individual Therapy Callie Fielding, PT   Jerrilyn Cairo  06/27/2021, 2:19 PM

## 2021-06-27 NOTE — Progress Notes (Signed)
Occupational Therapy Session Note  Patient Details  Name: NYILAH DASILVA MRN: [id number] Date of Birth: [birth year]  Today's Date: 06/27/2021 OT Individual Time: 1430-1530 OT Individual Time Calculation (min): 60 min    Short Term Goals: Week 1:  OT Short Term Goal 1 (Week 1): Pt will complete BSC transfer with +2 assist OT Short Term Goal 2 (Week 1): Pt will participate in 1 OT session while OOB for ~45 minutes to increase OOB tolerance OT Short Term Goal 3 (Week 1): Pt will complete 1 grooming task with Mod A using adaptive strategies  Skilled Therapeutic Interventions/Progress Updates:    Patient seated in w/c, daughters present for session.  Patient requests adl tasks and return to bed at close of session due to length of time OOB today.  Oral care completed with set up.  UB bathing with assist for right arm/hand.  She is able to doff and donn [place] shirt with CS.  SB transfer w/c to drop arm commode with min A of one.   Pants down via lateral leans with max A.  Continent of bowel and bladder.  Mod A for hygiene - she is able to reach peri area but dependent for buttocks.  SB transfer commode to bed using pad under buttocks with mod A of one and min A of second person to hold board/commode.  Sit to supine mod/max A for bilateral legs.  Lower body bathing bed level with max A - she is able to reach upper legs but requires assist for lower legs and buttocks.  Rolling right min A.  Max/dep to place clean brief.  She requested to leave pants off due to time of day.  She remained in bed at close of session.  Bed alarm set and callbell in reach.    Therapy Documentation Precautions:  Precautions Precautions: Posterior Hip Precaution Comments: vaginal bleeding from foley trauma Restrictions Weight Bearing Restrictions: Yes RUE Weight Bearing: Weight bearing as tolerated LUE Weight Bearing: Weight bear through elbow only RLE Weight Bearing: Non weight bearing LLE Weight Bearing: Weight bearing as  tolerated (with transfers only)  Therapy/Group: Individual Therapy  Carlos Levering 06/27/2021, 7:43 AM

## 2021-06-27 NOTE — Progress Notes (Signed)
PROGRESS NOTE   Subjective/Complaints:  Discussed Xray results in general, await ortho f/u.  No pain c/os this am , working wit SLP  ROS: Denies CP, SOB, N/V/D  Objective:   DG Forearm Left  Result Date: 06/26/2021 CLINICAL DATA:  Fracture. EXAM: LEFT FOREARM - 2 VIEW COMPARISON:  June 04, 2021. FINDINGS: The left forearm has been splinted and immobilized. Status post surgical internal fixation of old distal left radial and ulnar fractures. Stable line mint of fracture components is noted. No acute abnormality is noted. IMPRESSION: Postsurgical changes as described above. Electronically Signed   By: Marijo Conception M.D.   On: 06/26/2021 11:28   DG Wrist Complete Left  Result Date: 06/26/2021 CLINICAL DATA:  Fracture. EXAM: LEFT WRIST - COMPLETE 3+ VIEW COMPARISON:  June 04, 2021. FINDINGS: The left wrist is splinted and immobilized. Status post surgical internal fixation of old distal left radial and ulnar fractures. Stable alignment of the fracture components is noted. No acute abnormality is noted. IMPRESSION: Stable postsurgical changes as described above. Electronically Signed   By: Marijo Conception M.D.   On: 06/26/2021 11:24   DG Knee 1-2 Views Right  Result Date: 06/26/2021 CLINICAL DATA:  Fracture. EXAM: RIGHT KNEE - 1-2 VIEW COMPARISON:  May 27, 2021. FINDINGS: Status post surgical internal fixation of comminuted distal right humeral shaft fracture. Stable alignment of fracture components is noted. IMPRESSION: Stable postsurgical changes as described above. Electronically Signed   By: Marijo Conception M.D.   On: 06/26/2021 11:26   DG Pelvis Comp Min 3V  Result Date: 06/26/2021 CLINICAL DATA:  Pelvic fracture. EXAM: JUDET PELVIS - 3+ VIEW COMPARISON:  June 04, 2021. FINDINGS: Status post surgical internal fixation of right acetabular fracture. Status post surgical internal fixation of proximal left femoral fracture. No acute  fracture or dislocation is noted. Sacroiliac joints are unremarkable. IMPRESSION: Postsurgical changes as described above. No acute abnormality is noted. Electronically Signed   By: Marijo Conception M.D.   On: 06/26/2021 11:23   DG Humerus Left  Result Date: 06/26/2021 CLINICAL DATA:  Fracture. EXAM: LEFT HUMERUS - 2+ VIEW COMPARISON:  June 04, 2021. FINDINGS: Status post surgical internal fixation of mid humeral shaft fracture. Persistent fracture line is noted, although there does appear to be increased callus formation suggesting healing. IMPRESSION: Status post surgical internal fixation of healing mid humeral shaft fracture. Electronically Signed   By: Marijo Conception M.D.   On: 06/26/2021 11:27   DG FEMUR MIN 2 VIEWS LEFT  Result Date: 06/26/2021 CLINICAL DATA:  Fracture. EXAM: LEFT FEMUR 2 VIEWS COMPARISON:  May 27, 2021. FINDINGS: Status post intramedullary rod fixation of comminuted and displaced fracture involving the left femoral shaft. Stable alignment of fracture components is noted. Also noted is nondisplaced fracture involving the subtrochanteric region of the proximal left femur which is unchanged. IMPRESSION: Stable postsurgical and posttraumatic findings as described above. Electronically Signed   By: Marijo Conception M.D.   On: 06/26/2021 11:35   DG FEMUR, MIN 2 VIEWS RIGHT  Result Date: 06/26/2021 CLINICAL DATA:  Fracture. EXAM: RIGHT FEMUR 2 VIEWS COMPARISON:  May 27, 2021.  May 25, 2021.  FINDINGS: Status post surgical internal fixation of distal right femoral shaft fracture. Stable alignment of fracture components is noted. Intra-articular extension of the fracture is noted to the intercondylar notch of the distal femur which appears to been present on prior exam. IMPRESSION: Stable postsurgical and posttraumatic findings as described above. Electronically Signed   By: Marijo Conception M.D.   On: 06/26/2021 11:33   No results for input(s): WBC, HGB, HCT, PLT in the last 72  hours.  Recent Labs    06/26/21 0506  NA 135  K 3.3*  CL 102  CO2 26  GLUCOSE 119*  BUN 5*  CREATININE 0.54  CALCIUM 9.3     Intake/Output Summary (Last 24 hours) at 06/27/2021 0907 Last data filed at 06/26/2021 1822 Gross per 24 hour  Intake 474 ml  Output --  Net 474 ml      Pressure Injury 06/10/21 Head Lower;Posterior Stage 1 -  Intact skin with non-blanchable redness of a localized area usually over a bony prominence. posterior head; difficult to assess d/t matted hair; approximately 3cm x 3cm? (Active)  06/10/21 0830  Location: Head  Location Orientation: Lower;Posterior  Staging: Stage 1 -  Intact skin with non-blanchable redness of a localized area usually over a bony prominence.  Wound Description (Comments): posterior head; difficult to assess d/t matted hair; approximately 3cm x 3cm?  Present on Admission: No    Physical Exam: Vital Signs Blood pressure 138/89, pulse 94, temperature 98.2 F (36.8 C), temperature source Oral, resp. rate 18, height '5\' 8"'$  (1.727 m), weight 82.7 kg, SpO2 98 %.  General: No acute distress Mood and affect are appropriate Heart: Regular rate and rhythm no rubs murmurs or extra sounds Lungs: Clear to auscultation, breathing unlabored, no rales or wheezes Abdomen: Positive bowel sounds, soft nontender to palpation, nondistended Extremities: No clubbing, cyanosis, or edema Skin: No evidence of breakdown, no evidence of rash   Motor- 5/5 in RUE,  LUE: Shoulder abduction 2+/5, distally limited by splint  RLE 3- Hip and knee ext 4/5 ADF/PF LLE 3+/5 hip and knee ext 4/5 ADF/PF  Assessment/Plan: 1. Functional deficits which require 3+ hours per day of interdisciplinary therapy in a comprehensive inpatient rehab setting. Physiatrist is providing close team supervision and 24 hour management of active medical problems listed below. Physiatrist and rehab team continue to assess barriers to discharge/monitor patient progress toward  functional and medical goals  Care Tool:  Bathing    Body parts bathed by patient: Left arm, Chest, Abdomen, Right upper leg, Left upper leg, Face   Body parts bathed by helper: Right arm, Front perineal area, Buttocks, Right lower leg, Left lower leg     Bathing assist Assist Level: 2 Helpers     Upper Body Dressing/Undressing Upper body dressing   What is the patient wearing?: Pull over shirt    Upper body assist Assist Level: Moderate Assistance - Patient 50 - 74%    Lower Body Dressing/Undressing Lower body dressing      What is the patient wearing?: Incontinence brief     Lower body assist Assist for lower body dressing: Maximal Assistance - Patient 25 - 49%     Toileting Toileting    Toileting assist Assist for toileting: Total Assistance - Patient < 25%     Transfers Chair/bed transfer  Transfers assist  Chair/bed transfer activity did not occur: Safety/medical concerns  Chair/bed transfer assist level: 2 Helpers     Locomotion Ambulation   Ambulation assist  Ambulation activity did not occur: Safety/medical concerns (NWB RLE)          Walk 10 feet activity   Assist  Walk 10 feet activity did not occur: Safety/medical concerns (NWB RLE)        Walk 50 feet activity   Assist Walk 50 feet with 2 turns activity did not occur: Safety/medical concerns (NWB RLE)         Walk 150 feet activity   Assist Walk 150 feet activity did not occur: Safety/medical concerns (NWB RLE)         Walk 10 feet on uneven surface  activity   Assist Walk 10 feet on uneven surfaces activity did not occur: Safety/medical concerns (NWB RLE)         Wheelchair     Assist Will patient use wheelchair at discharge?: Yes Type of Wheelchair: Manual Wheelchair activity did not occur: Safety/medical concerns         Wheelchair 50 feet with 2 turns activity    Assist    Wheelchair 50 feet with 2 turns activity did not occur:  Safety/medical concerns       Wheelchair 150 feet activity     Assist  Wheelchair 150 feet activity did not occur: Safety/medical concerns       Blood pressure 138/89, pulse 94, temperature 98.2 F (36.8 C), temperature source Oral, resp. rate 18, height '5\' 8"'$  (1.727 m), weight 82.7 kg, SpO2 98 %. Medical Problem List and Plan: 1.  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  Antithrombotics: -DVT/anticoagulation:  Pharmaceutical: Other (comment) Eliquis             -antiplatelet therapy:  N/A 3. Pain Management: Oxycodone  prn  -pt prefers not to use oxydodone  - scheduled tramadol has been effective so far  -encouraged used of kpad -scheduled robaxin qid, ice Controlled with meds on 8/10 4. Mood/sleep:  LCSW to follow for evaluation and support.  -melatonin/trazodone             -antipsychotic agents:  seroquel 5. Neuropsych: This patient is almost capable of making decisions on her own behalf. 6. Skin/Wound Care: Routine pressure relief measures.              -continue local wound care as needed to multiple op/injury sites Wet to dry Left upper achilles area  7. Fluids/Electrolytes/Nutrition: Monitor I/Os. 8. Left humerus Fx and Left BBFx s/p ORIF: OK to WB on left elbow.some callus formation noted on repeat xrays  --ROM as tolerated.  9. Right acetabular /Femur Fx s/p ORIF: NWB RLE with R-THR precautions 10. Open left femur Fx s/p ORIF, left acetab fx and SPR/IPR fx's: WBAT LLE for transfers only. 11. L-open BBFA Fx s/p ORIF: NWB on left wrist 12. Acute blood loss anemia:   Hemoglobin 10.7 on 8/8  Continue to monitor 13. AKI: Resolved 14. PTSD/Insomnia: added prazosin as well as melatonin to help with sleep.             --monitor for now.seems to be in good spirits, has supportive family             --continue Seroquel for now             -neuropsych f/u while here 15. Urinary retention: Monitor voiding with PVR  checks. Volumes less than 350cc so far             --continue voiding trial, oob when possible.  -  continue urecholine 16. HTN: Monitor BP tid--has been variable. Vitals:   06/26/21 2007 06/27/21 0338  BP: (!) 138/91 138/89  Pulse: 98 94  Resp: 18 18  Temp: 98.3 F (36.8 C) 98.2 F (36.8 C)  SpO2: 97% 98%   Fair control 8/11  Continue to monitor 17.  Hypokalemia  Potassium 3.3 on 8/10  Supplemented x2 days on 8/10, recheck in am   Continue to monitor   LOS: 6 days A FACE TO [place] [facility] Breklyn Fabrizio 06/27/2021, 9:07 AM

## 2021-06-27 NOTE — Progress Notes (Signed)
Physical Therapy Session Note  Patient Details  Name: Melissa Harrington MRN: [id number] Date of Birth: [birth year]  Today's Date: 06/27/2021 PT Individual Time: 1000-1030 PT Individual Time Calculation (min): 30 min   Short Term Goals: Week 1:  PT Short Term Goal 1 (Week 1): Pt will complete supine to sit transfer to EOB with Mod Assist PT Short Term Goal 2 (Week 1): Pt will complete a sit to stand transfer with Mod Assist in setting of weight bearing precautions PT Short Term Goal 3 (Week 1): Pt will tolerate 30 mins of therapy without fatigue  Skilled Therapeutic Interventions/Progress Updates:     Pt supine in bed to start session - awake and agreeable to therapy. Reports 2/10 R hip pain at start. Rest breaks, repositoining, and mobility provided for pain management. Donned knee-high TED's and hospital socks with totalA at bed level (extra time needed due to several stitches along shin). Completes supine<>sit with minA (primarily just for trunk management) with HOB elevated ~40deg. Requires modA for forward scooting at EOB. Required ++ time for acclimating to upright position with reports of 3/10 dizziness. Completed seated ankle pumps for cardiovascular circulation and provided platform to support LE's while short sitting EOB. Completed sliding board transfer with modA from EOB to TIS w/c (Towards her R side and totalA for SB management). Pt reclined in TIS w/c for comfort and safety. Safety belt alarm on and all needs met. Pt made comfortable.   Therapy Documentation Precautions:  Precautions Precautions: Posterior Hip Precaution Comments: vaginal bleeding from foley trauma Restrictions Weight Bearing Restrictions: Yes RUE Weight Bearing: Weight bearing as tolerated LUE Weight Bearing: Weight bear through elbow only RLE Weight Bearing: Non weight bearing LLE Weight Bearing: Weight bearing as tolerated (with transfers only) General:    Therapy/Group: Individual Therapy  Alger Simons 06/27/2021, 7:41 AM

## 2021-06-27 NOTE — Progress Notes (Signed)
Speech Language Pathology TBI Note  Patient Details  Name: Melissa Harrington MRN: [id number] Date of Birth: [birth year]  Today's Date: 06/27/2021 SLP Individual Time: [id number] SLP Individual Time Calculation (min): 45 min  Short Term Goals: Week 1: SLP Short Term Goal 1 (Week 1): Pt will tolerate Dys 3/thin diet with no overt s/s aspiration with supervision level cues for use of strategies SLP Short Term Goal 2 (Week 1): Pt will participate in functional alternating attention tasks with mod A cues SLP Short Term Goal 3 (Week 1): Pt will complete mildly complex problem solving tasks with min A for 90% accuracy SLP Short Term Goal 4 (Week 1): Pt will complete medication and money management tasks in low stim environment with mod A SLP Short Term Goal 5 (Week 1): Pt will recall functional, novel information with 80% accuracy provided mod A cues for use of compensatory strategies  Skilled Therapeutic Interventions: Skilled treatment session focused on cognitive goals. SLP facilitated session by providing a computer task to focus on visual attention, sustained attention and problem solving within a visually distracting environment. Patient was able to identify when she was visually overwhelmed with strategies discussed. Despite this, patient demonstrated appropriate sustained attention and problem solving with a grocery organization task while utilizing online grocery ads. Patient left upright in bed with NT present. Continue with current plan of care.      Pain Pain Assessment Pain Scale: 0-10 Pain Score: 2  Hips and legs. RN aware and medication provided   Agitated Behavior Scale: TBI Observation Details Observation Environment: Patient's room Start of observation period - Date: 06/27/21 Start of observation period - Time: 0900 End of observation period - Date: 06/27/21 End of observation period - Time: 0945 Agitated Behavior Scale (DO NOT LEAVE BLANKS) Short attention span, easy  distractibility, inability to concentrate: Absent Impulsive, impatient, low tolerance for pain or frustration: Absent Uncooperative, resistant to care, demanding: Absent Violent and/or threatening violence toward people or property: Absent Explosive and/or unpredictable anger: Absent Rocking, rubbing, moaning, or other self-stimulating behavior: Absent Pulling at tubes, restraints, etc.: Absent Wandering from treatment areas: Absent Restlessness, pacing, excessive movement: Absent Repetitive behaviors, motor, and/or verbal: Absent Rapid, loud, or excessive talking: Absent Sudden changes of mood: Absent Easily initiated or excessive crying and/or laughter: Absent Self-abusiveness, physical and/or verbal: Absent Agitated behavior scale total score: 14  Therapy/Group: Individual Therapy  Louetta Hollingshead 06/27/2021, 10:13 AM

## 2021-06-27 NOTE — Plan of Care (Signed)
  Problem: Consults Goal: RH GENERAL PATIENT EDUCATION Description: See Patient Education module for education specifics. Outcome: Progressing Goal: Skin Care Protocol Initiated - if Braden Score 18 or less Description: If consults are not indicated, leave blank or document N/A Outcome: Progressing Goal: Nutrition Consult-if indicated Outcome: Progressing   Problem: RH BOWEL ELIMINATION Goal: RH STG MANAGE BOWEL WITH ASSISTANCE Description: STG Manage Bowel with Assistance. min Outcome: Progressing   Problem: RH PAIN MANAGEMENT Goal: RH STG PAIN MANAGED AT OR BELOW PT'S PAIN GOAL Description: Less than 4 Outcome: Progressing   Problem: RH BLADDER ELIMINATION Goal: RH STG MANAGE BLADDER WITH MEDICATION WITH ASSISTANCE Description: STG Manage Bladder With Medication With Mod I Assistance. Outcome: Progressing   Problem: RH SKIN INTEGRITY Goal: RH STG MAINTAIN SKIN INTEGRITY WITH ASSISTANCE Description: STG Maintain Skin Integrity With Mod I Assistance. Outcome: Progressing Goal: RH STG ABLE TO PERFORM INCISION/WOUND CARE W/ASSISTANCE Description: STG Able To Perform Incision/Wound Care With Mod I Assistance. Outcome: Progressing   Problem: RH SAFETY Goal: RH STG DECREASED RISK OF FALL WITH ASSISTANCE Description: STG Decreased Risk of Fall With Mod I Assistance. Outcome: Progressing
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
## 2021-06-28 DIAGNOSIS — S069X1S Unspecified intracranial injury with loss of consciousness of 30 minutes or less, sequela: Secondary | ICD-10-CM

## 2021-06-28 LAB — BASIC METABOLIC PANEL
Anion gap: 9 (ref 5–15)
BUN: <5 mg/dL — ABNORMAL LOW (ref 6–20)
CO2: 24 mmol/L (ref 22–32)
Calcium: 9.5 mg/dL (ref 8.9–10.3)
Chloride: 103 mmol/L (ref 98–111)
Creatinine, Ser: 0.5 mg/dL (ref 0.44–1.00)
GFR, Estimated: >60 mL/min (ref >60)
Glucose, Bld: 125 mg/dL — ABNORMAL HIGH (ref 70–99)
Potassium: 3.7 mmol/L (ref 3.5–5.1)
Sodium: 136 mmol/L (ref 135–145)

## 2021-06-28 MED ORDER — QUETIAPINE FUMARATE 25 MG PO TABS
25.0000 mg | ORAL_TABLET | Freq: Two times a day (BID) | ORAL | Status: DC
  Administered 2021-06-28 – 2021-07-01 (×6): 25 mg via ORAL
  Filled 2021-06-28 (×6): qty 1

## 2021-06-28 NOTE — Progress Notes (Signed)
Speech Language Pathology Weekly Progress and Session Note  Patient Details  Name: Melissa Harrington MRN: [id number] Date of Birth: 09/17/1971  Beginning of progress report period: June 21, 2021 End of progress report period: June 28, 2021  Today's Date: 06/28/2021 SLP Individual Time: 0915-1000 SLP Individual Time Calculation (min): 45 min  Short Term Goals: Week 1: SLP Short Term Goal 1 (Week 1): Pt will tolerate Dys 3/thin diet with no overt s/s aspiration with supervision level cues for use of strategies SLP Short Term Goal 1 - Progress (Week 1): Met SLP Short Term Goal 2 (Week 1): Pt will participate in functional alternating attention tasks with mod A cues SLP Short Term Goal 2 - Progress (Week 1): Met SLP Short Term Goal 3 (Week 1): Pt will complete mildly complex problem solving tasks with min A for 90% accuracy SLP Short Term Goal 3 - Progress (Week 1): Met SLP Short Term Goal 4 (Week 1): Pt will complete medication and money management tasks in low stim environment with mod A SLP Short Term Goal 4 - Progress (Week 1): Met SLP Short Term Goal 5 (Week 1): Pt will recall functional, novel information with 80% accuracy provided mod A cues for use of compensatory strategies SLP Short Term Goal 5 - Progress (Week 1): Met    New Short Term Goals: Week 2: SLP Short Term Goal 1 (Week 2): Patient will demonstrate complex problem solving for functional tasks with Mod I. SLP Short Term Goal 2 (Week 2): Patient will recall new, functional information with Mod I. SLP Short Term Goal 3 (Week 2): Patient will demonstrate diveded attention to functional tasks in a mildly distracting enviornment for ~30 minutes with Mod I.  Weekly Progress Updates: Patient has made excellent gains and has met 5 of 5 STGs this reporting period. Currently, patient demonstrates behaviors consistent with a Rancho Level VIII and requires overall supervision level verbal cues to complete functional and complex tasks  safely in regards to attention, problem solving and recall. Patient is consuming Dys. 3 textures with thin liquids with minimal overt s/s of aspiration with Mod I for use of swallowing compensatory strategies. Patient reports no desire to upgrade to regular textures at this time due to misalignment of jaw and multiple loose teeth resulting in painful mastication. Patient and family education ongoing. Patient would benefit from continued skilled SLP intervention to maximize her cognitive functioning and overall functional independence prior to discharge.      Intensity: Minumum of 1-2 x/day, 30 to 90 minutes Frequency: 3 to 5 out of 7 days Duration/Length of Stay: 2 weeks Treatment/Interventions: Cognitive remediation/compensation;Therapeutic Activities;Therapeutic Exercise;Internal/external aids;Cueing hierarchy;Functional tasks;Patient/family education;Dysphagia/aspiration precaution training;Environmental controls   Daily Session  Skilled Therapeutic Interventions:  Skilled treatment session focused on cognitive goals. SLP facilitated session by education patient on a variety of compensatory strategies to maximize ease of reading while on the computer. Patient was education on maximizing font and utilizing the "focus" and "read aloud" feature to minimize visual distractions. 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 Patient required repetition and written aids to recall steps to navigating the computer. Patient also reported magazine print is too small, therefore, looked up a recipe from a familiar magazine per her request and utilized the read aloud tool. Patient left upright in bed with all needs within reach. Continue with current plan of care.     Pain Mild burning sensation on right big toe. Ice applied  Therapy/Group: Individual Therapy  Aylah Yeary 06/28/2021, 3:35 PM

## 2021-06-28 NOTE — Progress Notes (Signed)
Occupational Therapy TBI Note  Patient Details  Name: Melissa Harrington MRN: [id number] Date of Birth: Feb 07, 1971  Today's Date: 06/28/2021 OT Individual Time: 2585-2778 OT Individual Time Calculation (min): 78 min    Short Term Goals: Week 1:  OT Short Term Goal 1 (Week 1): Pt will complete BSC transfer with +2 assist OT Short Term Goal 2 (Week 1): Pt will participate in 1 OT session while OOB for ~45 minutes to increase OOB tolerance OT Short Term Goal 3 (Week 1): Pt will complete 1 grooming task with Mod A using adaptive strategies  Skilled Therapeutic Interventions/Progress Updates:    Pt in TIS, moderate pain in her R LE but reporting she is premedicated,  agreeable to OT session. First brought pt via TIS outside for sunlight/fresh air. She enjoyed the fresh air and while she sat discussed TBI recovery, d/c home, visitors, coping/grief, family support, and importance of regaining mod independence in IADLs and roles she previously held. Pt very openly discussed loss of husband and coping. Returned inside and pt completed slideboard transfer to the mat with block under L LE, and 2 stacked yoga blocks used for improved LUE elbow weightbearing. Min A for transfer! Sitting EOM pt completed BUE coordination task with unweighted ball- focus on scapular retraction and protraction- 2x10 repetitions. Pt completed several activities focused on lateral leans, L shoulder Wbing through the elbow, and functional reaching to simulate toileting tasks. Pt returned to the TIS w/c with slideboard with only min A with someone supporting yoga blocks under the LUE. Discussed continued visual perception deficits, including diplopia and blurring of vision. Partially occluded glasses and provided pt a notebook for both oculomotor exercises and for journaling. Pt transferred back to supine and was left with all needs met, bed alarm set.   Therapy Documentation Precautions:  Precautions Precautions: Posterior  Hip Precaution Comments: vaginal bleeding from foley trauma Restrictions Weight Bearing Restrictions: Yes RUE Weight Bearing: Weight bearing as tolerated LUE Weight Bearing: Weight bear through elbow only RLE Weight Bearing: Non weight bearing LLE Weight Bearing: Weight bearing as tolerated  Agitated Behavior Scale: TBI Observation Details Observation Environment: CIR Start of observation period - Date: 06/28/21 Start of observation period - Time: 1345 End of observation period - Date: 06/28/21 End of observation period - Time: 1500 Agitated Behavior Scale (DO NOT LEAVE BLANKS) Short attention span, easy distractibility, inability to concentrate: Absent Impulsive, impatient, low tolerance for pain or frustration: Absent Uncooperative, resistant to care, demanding: Absent Violent and/or threatening violence toward people or property: Absent Explosive and/or unpredictable anger: Absent Rocking, rubbing, moaning, or other self-stimulating behavior: Absent Pulling at tubes, restraints, etc.: Absent Wandering from treatment areas: Absent Restlessness, pacing, excessive movement: Absent Repetitive behaviors, motor, and/or verbal: Absent Rapid, loud, or excessive talking: Absent Sudden changes of mood: Absent Easily initiated or excessive crying and/or laughter: Absent Self-abusiveness, physical and/or verbal: Absent Agitated behavior scale total score: 14      Therapy/Group: Individual Therapy  Curtis Sites 06/28/2021, 3:18 PM

## 2021-06-28 NOTE — Plan of Care (Signed)
Behavioral Plan   Rancho Level: VIII  Behavior to decrease/ eliminate:   -pain/discomfort   Changes to environment:   -All needed items within reach of RUE -1 pillow vertically under each leg  -1 pillow under left arm -Needs a pillow between legs when rolling  Interventions:  -Standard fall risk -pain management with rehab team -likes an ice pack on top of right foot to relieve burning sensation   Recommendations for interactions with patient:  -No special recommendations needed    Attendees:   Weston Anna, SLP Laverle Hobby, OT

## 2021-06-28 NOTE — Discharge Instructions (Addendum)
Inpatient Rehab Discharge Instructions  Melissa Harrington Discharge date and time:  07/12/21  Activities/Precautions/ Functional Status: Activity: no lifting, driving, or strenuous exercise till cleared by MD Diet: regular diet Wound Care: keep wound clean and dry   Functional status:  ___ No restrictions     ___ Walk up steps independently _X__ 24/7 supervision/assistance   ___ Walk up steps with assistance ___ Intermittent supervision/assistance  ___ Bathe/dress independently ___ Walk with walker     _X__ Bathe/dress with assistance ___ Walk Independently    ___ Shower independently ___ Walk with assistance    ___ Shower with assistance _X__ No alcohol     ___ Return to work/school ________   COMMUNITY REFERRALS UPON DISCHARGE:    Home Health:   PT     OT      SNA                    Agency:[facility]  Phone:[phone] *Please expect follow-up within 2-3 days to schedule your home visit. If you do not receive follow-up, be sure to contact branch directly.*        Medical Equipment/Items Ordered:wheelchair and drop arm bedside commode                                                 Agency/Supplier:[facility] [phone]  Special Instructions: Can weight bear as tolerated on left leg for transfers only.  NO Weight on right leg Need to wear left hand brace at all times--can remove for range of motion, 3. No weight on left hand--can bear weight on left elbow only.    My questions have been answered and I understand these instructions. I will adhere to these goals and the provided educational materials after my discharge from the hospital.  Patient/Caregiver Signature _______________________________ Date __________  Clinician Signature _______________________________________ Date __________  Please bring this form and your medication list with you to all your follow-up doctor's appointments  Information on my medicine - ELIQUIS (apixaban)  This medication  education was reviewed with me or my healthcare representative as part of my discharge preparation.    Why was Eliquis prescribed for you? Eliquis was prescribed to treat blood clots that may have been found in the veins of your legs (deep vein thrombosis) or in your lungs (pulmonary embolism) and to reduce the risk of them occurring again.  What do You need to know about Eliquis ? The dose is ONE 5 mg tablet taken TWICE daily.  Eliquis may be taken with or without food.   Try to take the dose about the same time in the morning and in the evening. If you have difficulty swallowing the tablet whole please discuss with your pharmacist how to take the medication safely.  Take Eliquis exactly as prescribed and DO NOT stop taking Eliquis without talking to the doctor who prescribed the medication.  Stopping may increase your risk of developing a new blood clot.  Refill your prescription before you run out.  After discharge, you should have regular check-up appointments with your healthcare provider that is prescribing your Eliquis.    What do you do if you miss a dose? If a dose of ELIQUIS is not taken at the scheduled time, take it as soon as possible on the same day and twice-daily administration should be resumed. The dose  should not be doubled to make up for a missed dose.  Important Safety Information A possible side effect of Eliquis is bleeding. You should call your healthcare provider right away if you experience any of the following: Bleeding from an injury or your nose that does not stop. Unusual colored urine (red or dark brown) or unusual colored stools (red or black). Unusual bruising for unknown reasons. A serious fall or if you hit your head (even if there is no bleeding).  Some medicines may interact with Eliquis and might increase your risk of bleeding or clotting while on Eliquis. To help avoid this, consult your healthcare provider or pharmacist prior to using any new  prescription or non-prescription medications, including herbals, vitamins, non-steroidal anti-inflammatory drugs (NSAIDs) and supplements.  This website has more information on Eliquis (apixaban): http://[email or web address].com/eliquis/home

## 2021-06-28 NOTE — Progress Notes (Signed)
PROGRESS NOTE   Subjective/Complaints:  No new issues. Working through pain. Scheduled tramadol has helped her tolerate activities with therapy. Has had tingling, burning right big toe/foot, worst at night  ROS: Patient denies fever, rash, sore throat, blurred vision, nausea, vomiting, diarrhea, cough, shortness of breath or chest pain,   headache, or mood change.   Objective:   No results found. No results for input(s): WBC, HGB, HCT, PLT in the last 72 hours.  Recent Labs    06/26/21 0506 06/28/21 0635  NA 135 136  K 3.3* 3.7  CL 102 103  CO2 26 24  GLUCOSE 119* 125*  BUN 5* <5*  CREATININE 0.54 0.50  CALCIUM 9.3 9.5    Intake/Output Summary (Last 24 hours) at 06/28/2021 1138 Last data filed at 06/27/2021 1730 Gross per 24 hour  Intake 416 ml  Output --  Net 416 ml     Pressure Injury 06/10/21 Head Lower;Posterior Stage 1 -  Intact skin with non-blanchable redness of a localized area usually over a bony prominence. posterior head; difficult to assess d/t matted hair; approximately 3cm x 3cm? (Active)  06/10/21 0830  Location: Head  Location Orientation: Lower;Posterior  Staging: Stage 1 -  Intact skin with non-blanchable redness of a localized area usually over a bony prominence.  Wound Description (Comments): posterior head; difficult to assess d/t matted hair; approximately 3cm x 3cm?  Present on Admission: No    Physical Exam: Vital Signs Blood pressure (!) 150/97, pulse 90, temperature 98.2 F (36.8 C), temperature source Oral, resp. rate 18, height '5\' 8"'$  (1.727 m), weight 82.7 kg, SpO2 97 %.  Constitutional: No distress . Vital signs reviewed. HEENT: NCAT, EOMI, oral membranes moist Neck: supple Cardiovascular: RRR without murmur. No JVD    Respiratory/Chest: CTA Bilaterally without wheezes or rales. Normal effort    GI/Abdomen: BS +, non-tender, non-distended Ext: no clubbing, cyanosis, or  edema Psych: pleasant and cooperative  Skin: multiple lacs, wounds with sutures throughout. Everything dry Motor- 5/5 in RUE,  LUE: Shoulder abduction 2+/5, distally limited by splint  RLE 3- Hip and knee ext 4/5 ADF/PF, ?mild weakness right ADF/EHL, sensory loss on top of foot to medial ankle. LLE 3+/5 hip and knee ext 4/5 ADF/PF  Assessment/Plan: 1. Functional deficits which require 3+ hours per day of interdisciplinary therapy in a comprehensive inpatient rehab setting. Physiatrist is providing close team supervision and 24 hour management of active medical problems listed below. Physiatrist and rehab team continue to assess barriers to discharge/monitor patient progress toward functional and medical goals  Care Tool:  Bathing    Body parts bathed by patient: Left arm, Chest, Abdomen, Right upper leg, Left upper leg, Face, Front perineal area   Body parts bathed by helper: Right arm, Buttocks, Right lower leg, Left lower leg     Bathing assist Assist Level: Moderate Assistance - Patient 50 - 74%     Upper Body Dressing/Undressing Upper body dressing   What is the patient wearing?: Pull over shirt    Upper body assist Assist Level: Supervision/Verbal cueing    Lower Body Dressing/Undressing Lower body dressing      What is the patient wearing?: Incontinence  brief     Lower body assist Assist for lower body dressing: Maximal Assistance - Patient 25 - 49%     Toileting Toileting    Toileting assist Assist for toileting: Maximal Assistance - Patient 25 - 49%     Transfers Chair/bed transfer  Transfers assist  Chair/bed transfer activity did not occur: Safety/medical concerns  Chair/bed transfer assist level: 2 Helpers     Locomotion Ambulation   Ambulation assist   Ambulation activity did not occur: Safety/medical concerns (NWB RLE)          Walk 10 feet activity   Assist  Walk 10 feet activity did not occur: Safety/medical concerns (NWB RLE)         Walk 50 feet activity   Assist Walk 50 feet with 2 turns activity did not occur: Safety/medical concerns (NWB RLE)         Walk 150 feet activity   Assist Walk 150 feet activity did not occur: Safety/medical concerns (NWB RLE)         Walk 10 feet on uneven surface  activity   Assist Walk 10 feet on uneven surfaces activity did not occur: Safety/medical concerns (NWB RLE)         Wheelchair     Assist Will patient use wheelchair at discharge?: Yes Type of Wheelchair: Manual Wheelchair activity did not occur: Safety/medical concerns         Wheelchair 50 feet with 2 turns activity    Assist    Wheelchair 50 feet with 2 turns activity did not occur: Safety/medical concerns       Wheelchair 150 feet activity     Assist  Wheelchair 150 feet activity did not occur: Safety/medical concerns       Blood pressure (!) 150/97, pulse 90, temperature 98.2 F (36.8 C), temperature source Oral, resp. rate 18, height '5\' 8"'$  (1.727 m), weight 82.7 kg, SpO2 97 %. Medical Problem List and Plan: 1.  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  Antithrombotics: -DVT/anticoagulation:  Pharmaceutical: Other (comment) Eliquis             -antiplatelet therapy:  N/A 3. Pain Management: Oxycodone  prn  -pt prefers not to use oxydodone  - scheduled tramadol has been effective so far  -encouraged used of kpad -scheduled robaxin qid, ice Controlled with meds on 8/12  -prefers not to treat dysesthesias right foot. ?partial peroneal nerve injury 4. Mood/sleep:  LCSW to follow for evaluation and support.  -melatonin/trazodone             -antipsychotic agents:  seroquel--reduce to '25mg'$  bid  -appreciate neuropsych assessment 5. Neuropsych: This patient is almost capable of making decisions on her own behalf. 6. Skin/Wound Care: Routine pressure relief measures.               -continue local wound care as needed to multiple op/injury sites Wet to dry Left upper achilles area  7. Fluids/Electrolytes/Nutrition: Monitor I/Os. 8. Left humerus Fx and Left BBFx s/p ORIF: OK to WB on left elbow.some callus formation noted on repeat xrays  --ROM as tolerated.  9. Right acetabular /Femur Fx s/p ORIF: NWB RLE with R-THR precautions 10. Open left femur Fx s/p ORIF, left acetab fx and SPR/IPR fx's: WBAT LLE for transfers only. 11. L-open BBFA Fx s/p ORIF: NWB on left wrist 12. Acute blood loss anemia:   Hemoglobin 10.7 on 8/8  Continue  to monitor 13. AKI: Resolved 14. PTSD/Insomnia: added prazosin as well as melatonin to help with sleep.             --monitor for now.seems to be in good spirits, has supportive family             --continue Seroquel for now             -neuropsych f/u appreciated 15. Urinary retention: voiding, dc urecholine 16. HTN: Monitor BP tid--has been variable. Vitals:   06/27/21 1943 06/28/21 0515  BP: 139/90 (!) 150/97  Pulse: (!) 108 90  Resp: 18 18  Temp: 98.3 F (36.8 C) 98.2 F (36.8 C)  SpO2: 97% 97%   Fair control 8/12  Continue to monitor 17.  Hypokalemia  Potassium 3.7 8/12---continue to supplement daily     LOS: 7 days A FACE TO FACE EVALUATION WAS PERFORMED  Meredith Staggers 06/28/2021, 11:38 AM

## 2021-06-28 NOTE — Progress Notes (Signed)
Physical Therapy Session Note  Patient Details  Name: Melissa Harrington MRN: [id number] Date of Birth: 04-May-1971  Today's Date: 06/28/2021 PT Individual Time: 1100-1200 PT Individual Time Calculation (min): 60 min   Short Term Goals: Week 1:  PT Short Term Goal 1 (Week 1): Pt will complete supine to sit transfer to EOB with Mod Assist PT Short Term Goal 2 (Week 1): Pt will complete a sit to stand transfer with Mod Assist in setting of weight bearing precautions PT Short Term Goal 3 (Week 1): Pt will tolerate 30 mins of therapy without fatigue  Skilled Therapeutic Interventions/Progress Updates: Pt presents supine in bed and agreeable to participate in therapy.  Pt required Total A to don TED hose.  Pt required verbal cues to initiate LEs to EOB and then off, use of L elbow and then required min A A to initiate trunk before completing w/ only CGA.  Pt sat EOB for Max A to don shoe on L foot.  Pt able to lean to L and initiate RLE lifting to place SB.  Pt used platform for LLE to transfer to TIS w/c .  Pt required min to CGA w/ use of PT for L FA to scoot.  Pt given cues for R hand placement to improve hip clearance.  Pt wheeled to gym for time conservation.  Pt transferred via SB w/c > mat table using LLE on floor and CGA.  Pt practiced scooting to R on mat table, attempting to improve hip clearance.  Pt performed seated cross-body reaches, hooking horseshoes over mid-level height hoop and reaching across midline, throwing horseshoes, and catching/throwing light ball w/ LEs on [employer] cushion.  Pt returned to room and reclined slightly in TIS to comfort, seat belt on and family member in room, all needs available.  Pt encouraged to perform pressure relief as needed and pt demos.     Therapy Documentation Precautions:  Precautions Precautions: Posterior Hip Precaution Comments: vaginal bleeding from foley trauma Restrictions Weight Bearing Restrictions: Yes RUE Weight Bearing: Weight bearing as  tolerated LUE Weight Bearing: Weight bear through elbow only RLE Weight Bearing: Non weight bearing LLE Weight Bearing: Weight bearing as tolerated General:   Vital Signs:  Pain:4/10 to R foot Pain Assessment Pain Scale: 0-10 Pain Score: 0-No pain Mobility:       Therapy/Group: Individual Therapy  Ladoris Gene 06/28/2021, 12:10 PM

## 2021-06-28 NOTE — Plan of Care (Signed)
  Problem: Consults Goal: RH GENERAL PATIENT EDUCATION Description: See Patient Education module for education specifics. Outcome: Progressing Goal: Skin Care Protocol Initiated - if Braden Score 18 or less Description: If consults are not indicated, leave blank or document N/A Outcome: Progressing Goal: Nutrition Consult-if indicated Outcome: Progressing   Problem: RH BOWEL ELIMINATION Goal: RH STG MANAGE BOWEL WITH ASSISTANCE Description: STG Manage Bowel with Assistance. min Outcome: Progressing   Problem: RH PAIN MANAGEMENT Goal: RH STG PAIN MANAGED AT OR BELOW PT'S PAIN GOAL Description: Less than 4 Outcome: Progressing   Problem: RH BLADDER ELIMINATION Goal: RH STG MANAGE BLADDER WITH MEDICATION WITH ASSISTANCE Description: STG Manage Bladder With Medication With Mod I Assistance. Outcome: Progressing   Problem: RH SKIN INTEGRITY Goal: RH STG MAINTAIN SKIN INTEGRITY WITH ASSISTANCE Description: STG Maintain Skin Integrity With Mod I Assistance. Outcome: Progressing Goal: RH STG ABLE TO PERFORM INCISION/WOUND CARE W/ASSISTANCE Description: STG Able To Perform Incision/Wound Care With Mod I Assistance. Outcome: Progressing   Problem: RH SAFETY Goal: RH STG DECREASED RISK OF FALL WITH ASSISTANCE Description: STG Decreased Risk of Fall With Mod I Assistance. Outcome: Progressing
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
## 2021-06-29 NOTE — Progress Notes (Signed)
Physical Therapy TBI Note  Patient Details  Name: Melissa Harrington MRN: [id number] Date of Birth: 15-Oct-1971  Today's Date: 06/29/2021 PT Individual Time: 0800-0857 PT Individual Time Calculation (min): 57 min   Short Term Goals: Week 1:  PT Short Term Goal 1 (Week 1): Pt will complete supine to sit transfer to EOB with Mod Assist PT Short Term Goal 2 (Week 1): Pt will complete a sit to stand transfer with Mod Assist in setting of weight bearing precautions PT Short Term Goal 3 (Week 1): Pt will tolerate 30 mins of therapy without fatigue  Skilled Therapeutic Interventions/Progress Updates:    Pt received in bed with HOB/legs elevated. Pt agreeable to therapy and requests assist with donning TEDS/pants. Increased time to donn TEDS RLE 2/2 stitches. Rolling left/right with minA while therapist assisted in donning pants.  Supine> EOB HOB elevated MinA for terminal transitioning of RLE off EOB. CGA provided with trunk transition while patient pushing up with LUE elbow.  SB in place with MaxA for placement. Pt completed all SB transfers minA utilizing yoga blocks for LUE elbow weight bearing during tranfer to the left, requiring small pivots 2/2 decreased distance from hips>yoga blocks. Pt maintains NWB RLE throughout.   EOM exercises bilaterally: LAQ x10  Marches x10 - AAROM RLE  Hip adduction iso with pillow squeeze x10, 3second hold Hip abduction x10  Ankle pumps x20  Manual efflurage massage to bilateral calf and manual calf stretches 8'    Pt sitting in TIS wc, brakes locked, slightly reclined. Call bell within reach. All needs met at this time.  Therapy Documentation Precautions:  Precautions Precautions: Posterior Hip Precaution Comments: vaginal bleeding from foley trauma Restrictions Weight Bearing Restrictions: Yes RUE Weight Bearing: Weight bearing as tolerated LUE Weight Bearing: Weight bear through elbow only RLE Weight Bearing: Non weight bearing LLE Weight Bearing:  Weight bearing as tolerated  Pain: Pain Assessment Pain Scale: 0-10 Pain Score: 2  Faces Pain Scale: Hurts a little bit Agitated Behavior Scale: TBI Observation Details Observation Environment: Patients Room/Therapy gym Start of observation period - Date: 06/29/21 Start of observation period - Time: 0800 End of observation period - Date: 06/29/21 End of observation period - Time: 0857 Agitated Behavior Scale (DO NOT LEAVE BLANKS) Short attention span, easy distractibility, inability to concentrate: Absent Impulsive, impatient, low tolerance for pain or frustration: Absent Uncooperative, resistant to care, demanding: Absent Violent and/or threatening violence toward people or property: Absent Explosive and/or unpredictable anger: Absent Rocking, rubbing, moaning, or other self-stimulating behavior: Absent Pulling at tubes, restraints, etc.: Absent Wandering from treatment areas: Absent Restlessness, pacing, excessive movement: Absent Repetitive behaviors, motor, and/or verbal: Absent Rapid, loud, or excessive talking: Absent Sudden changes of mood: Absent Easily initiated or excessive crying and/or laughter: Absent Self-abusiveness, physical and/or verbal: Absent Agitated behavior scale total score: 14  Therapy/Group: Individual Therapy  Catalena Stanhope, SPT 06/29/2021, 9:03 AM

## 2021-06-29 NOTE — Plan of Care (Signed)
  Problem: Consults Goal: RH GENERAL PATIENT EDUCATION Description: See Patient Education module for education specifics. Outcome: Progressing Goal: Skin Care Protocol Initiated - if Braden Score 18 or less Description: If consults are not indicated, leave blank or document N/A Outcome: Progressing Goal: Nutrition Consult-if indicated Outcome: Progressing   Problem: RH BOWEL ELIMINATION Goal: RH STG MANAGE BOWEL WITH ASSISTANCE Description: STG Manage Bowel with Assistance. min Outcome: Progressing   Problem: RH PAIN MANAGEMENT Goal: RH STG PAIN MANAGED AT OR BELOW PT'S PAIN GOAL Description: Less than 4 Outcome: Progressing   Problem: RH BLADDER ELIMINATION Goal: RH STG MANAGE BLADDER WITH MEDICATION WITH ASSISTANCE Description: STG Manage Bladder With Medication With Mod I Assistance. Outcome: Progressing   Problem: Consults Goal: RH GENERAL PATIENT EDUCATION Description: See Patient Education module for education specifics. Outcome: Progressing Goal: Skin Care Protocol Initiated - if Braden Score 18 or less Description: If consults are not indicated, leave blank or document N/A Outcome: Progressing Goal: Nutrition Consult-if indicated Outcome: Progressing   Problem: RH BOWEL ELIMINATION Goal: RH STG MANAGE BOWEL WITH ASSISTANCE Description: STG Manage Bowel with Assistance. min Outcome: Progressing   Problem: RH PAIN MANAGEMENT Goal: RH STG PAIN MANAGED AT OR BELOW PT'S PAIN GOAL Description: Less than 4 Outcome: Progressing   Problem: RH BLADDER ELIMINATION Goal: RH STG MANAGE BLADDER WITH MEDICATION WITH ASSISTANCE Description: STG Manage Bladder With Medication With Mod I Assistance. Outcome: Progressing   Problem: RH SKIN INTEGRITY Goal: RH STG MAINTAIN SKIN INTEGRITY WITH ASSISTANCE Description: STG Maintain Skin Integrity With Mod I Assistance. Outcome: Progressing Goal: RH STG ABLE TO PERFORM INCISION/WOUND CARE W/ASSISTANCE Description: STG Able To  Perform Incision/Wound Care With Mod I Assistance. Outcome: Progressing   Problem: RH SAFETY Goal: RH STG DECREASED RISK OF FALL WITH ASSISTANCE Description: STG Decreased Risk of Fall With Mod I Assistance. Outcome: Progressing

## 2021-06-29 NOTE — Progress Notes (Signed)
Speech Language Pathology Daily Session Note  Patient Details  Name: Melissa Harrington MRN: [id number] Date of Birth: 04/18/1971  Today's Date: 06/29/2021 SLP Individual Time: [id number] SLP Individual Time Calculation (min): 42 min  Short Term Goals: Week 2: SLP Short Term Goal 1 (Week 2): Patient will demonstrate complex problem solving for functional tasks with Mod I. SLP Short Term Goal 2 (Week 2): Patient will recall new, functional information with Mod I. SLP Short Term Goal 3 (Week 2): Patient will demonstrate diveded attention to functional tasks in a mildly distracting enviornment for ~30 minutes with Mod I.  Skilled Therapeutic Interventions:Skilled ST services focused on cognitive skills. Pt demonstrated general recall of novel computer adaptations learned in yesterday's ST session to compensate for vision deficits. SLP facilitated complex problem solving skills in 4 scheduling tasks pt required supervision A quickly fading to mod I for problem solving and self-correction of errors. Pt expressed continued deficits with memory in retrieval. Pt directed set up for tranfers from [facility] to bed with sideboard mod I.  Pt was left in room with call bell within reach and bed alarm set. SLP recommends to continue skilled services.     Pain Pain Assessment Pain Scale: 0-10 Pain Score: 0-No pain Faces Pain Scale: Hurts a little bit  Therapy/Group: Individual Therapy  Calogero Geisen  [facility] 06/29/2021, 10:37 AM

## 2021-06-30 DIAGNOSIS — D62 Acute posthemorrhagic anemia: Secondary | ICD-10-CM

## 2021-06-30 DIAGNOSIS — S069X1D Unspecified intracranial injury with loss of consciousness of 30 minutes or less, subsequent encounter: Secondary | ICD-10-CM

## 2021-06-30 NOTE — Progress Notes (Signed)
PROGRESS NOTE   Subjective/Complaints: Patient seen sitting up in bed this morning eating breakfast.  She states she slept well overnight.  She denies complaints.  ROS: Denies CP, SOB, N/V/D  Objective:   No results found. No results for input(s): WBC, HGB, HCT, PLT in the last 72 hours.  Recent Labs    06/28/21 0635  NA 136  K 3.7  CL 103  CO2 24  GLUCOSE 125*  BUN <5*  CREATININE 0.50  CALCIUM 9.5     Intake/Output Summary (Last 24 hours) at 06/30/2021 1922 Last data filed at 06/30/2021 1813 Gross per 24 hour  Intake 600 ml  Output --  Net 600 ml      Pressure Injury 06/10/21 Head Lower;Posterior Stage 1 -  Intact skin with non-blanchable redness of a localized area usually over a bony prominence. posterior head; difficult to assess d/t matted hair; approximately 3cm x 3cm? (Active)  06/10/21 0830  Location: Head  Location Orientation: Lower;Posterior  Staging: Stage 1 -  Intact skin with non-blanchable redness of a localized area usually over a bony prominence.  Wound Description (Comments): posterior head; difficult to assess d/t matted hair; approximately 3cm x 3cm?  Present on Admission: No    Physical Exam: Vital Signs Blood pressure (!) 139/92, pulse 88, temperature 98.3 F (36.8 C), temperature source Oral, resp. rate 18, height '5\' 8"'$  (1.727 m), weight 81.1 kg, SpO2 99 %. Constitutional: No distress . Vital signs reviewed. HENT: Normocephalic.  Atraumatic. Eyes: EOMI. No discharge. Cardiovascular: No JVD.  RRR. Respiratory: Normal effort.  No stridor.  Bilateral clear to auscultation. GI: Non-distended.  BS +. Skin: Warm and dry.  Scattered lacerations, wounds with sutures. Psych: Normal mood.  Normal behavior. Musc: No edema in extremities.  No tenderness in extremities. Neuro: Alert Motor: 5/5 in RUE,  LUE: Shoulder abduction 2+/5, distally limited by splint, unchanged RLE 3- Hip and knee  ext 4/5 ADF/PF, ?mild weakness right ADF/EHL, sensory loss on top of foot to medial ankle. LLE 3+/5 hip and knee ext 4/5 ADF/PF  Assessment/Plan: 1. Functional deficits which require 3+ hours per day of interdisciplinary therapy in a comprehensive inpatient rehab setting. Physiatrist is providing close team supervision and 24 hour management of active medical problems listed below. Physiatrist and rehab team continue to assess barriers to discharge/monitor patient progress toward functional and medical goals  Care Tool:  Bathing    Body parts bathed by patient: Left arm, Chest, Abdomen, Right upper leg, Left upper leg, Face, Front perineal area   Body parts bathed by helper: Right arm, Buttocks, Right lower leg, Left lower leg     Bathing assist Assist Level: Moderate Assistance - Patient 50 - 74%     Upper Body Dressing/Undressing Upper body dressing   What is the patient wearing?: Pull over shirt    Upper body assist Assist Level: Supervision/Verbal cueing    Lower Body Dressing/Undressing Lower body dressing      What is the patient wearing?: Incontinence brief     Lower body assist Assist for lower body dressing: Maximal Assistance - Patient 25 - 49%     Toileting Toileting    Toileting assist Assist  for toileting: Maximal Assistance - Patient 25 - 49%     Transfers Chair/bed transfer  Transfers assist  Chair/bed transfer activity did not occur: Safety/medical concerns  Chair/bed transfer assist level: Contact Guard/Touching assist     Locomotion Ambulation   Ambulation assist   Ambulation activity did not occur: Safety/medical concerns          Walk 10 feet activity   Assist  Walk 10 feet activity did not occur: Safety/medical concerns (NWB RLE)        Walk 50 feet activity   Assist Walk 50 feet with 2 turns activity did not occur: Safety/medical concerns         Walk 150 feet activity   Assist Walk 150 feet activity did not  occur: Safety/medical concerns         Walk 10 feet on uneven surface  activity   Assist Walk 10 feet on uneven surfaces activity did not occur: Safety/medical concerns         Wheelchair     Assist Will patient use wheelchair at discharge?: Yes Type of Wheelchair: Manual Wheelchair activity did not occur: Safety/medical concerns         Wheelchair 50 feet with 2 turns activity    Assist    Wheelchair 50 feet with 2 turns activity did not occur: Safety/medical concerns       Wheelchair 150 feet activity     Assist  Wheelchair 150 feet activity did not occur: Safety/medical concerns       Blood pressure (!) 139/92, pulse 88, temperature 98.3 F (36.8 C), temperature source Oral, resp. rate 18, height '5\' 8"'$  (1.727 m), weight 81.1 kg, SpO2 99 %. Medical Problem List and Plan: 1.  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  Antithrombotics: -DVT/anticoagulation:  Pharmaceutical: Other (comment) Eliquis             -antiplatelet therapy:  N/A 3. Pain Management: Oxycodone  prn  -pt prefers not to use oxydodone  - scheduled tramadol has been effective so far  -encouraged used of kpad -scheduled robaxin qid, ice Controlled with meds on 8/14  -prefers not to treat dysesthesias right foot. ?partial peroneal nerve injury 4. Mood/sleep:  LCSW to follow for evaluation and support.  -melatonin/trazodone             -antipsychotic agents:  seroquel--reduce to '25mg'$  bid  -appreciate neuropsych assessment 5. Neuropsych: This patient is almost capable of making decisions on her own behalf. 6. Skin/Wound Care: Routine pressure relief measures.              -continue local wound care as needed to multiple op/injury sites Wet to dry Left upper achilles area  7. Fluids/Electrolytes/Nutrition: Monitor I/Os. 8. Left humerus Fx and Left BBFx s/p ORIF: OK to WB on left elbow. Some callus formation noted on repeat xrays  --ROM as  tolerated.  9. Right acetabular /Femur Fx s/p ORIF: NWB RLE with R-THR precautions 10. Open left femur Fx s/p ORIF, left acetab fx and SPR/IPR fx's: WBAT LLE for transfers only. 11. L-open BBFA Fx s/p ORIF: NWB on left wrist 12. Acute blood loss anemia:   Hemoglobin 10.7 on 8/8, labs ordered for tomorrow  Continue to monitor 13. AKI: Resolved 14. PTSD/Insomnia: added prazosin as well as melatonin to help with sleep.             --monitor for now.seems to be in good spirits, has supportive family             --  continue Seroquel for now             -neuropsych f/u appreciated 15. Urinary retention: voiding, dc urecholine 16. HTN: Monitor BP tid--has been variable. Vitals:   06/30/21 0437 06/30/21 1404  BP: (!) 145/95 (!) 139/92  Pulse: 85 88  Resp: 17 18  Temp: 98.3 F (36.8 C) 98.3 F (36.8 C)  SpO2: 96% 99%   Mildly elevated on 8/14, will consider medication adjustments if persistent  Continue to monitor 17.  Hypokalemia  Potassium 3.7 on 8/12---continue to supplement daily     LOS: 9 days A FACE TO FACE EVALUATION WAS PERFORMED  Jex Strausbaugh Lorie Phenix 06/30/2021, 7:22 PM

## 2021-06-30 NOTE — Plan of Care (Signed)
  Problem: Consults Goal: RH GENERAL PATIENT EDUCATION Description: See Patient Education module for education specifics. Outcome: Progressing Goal: Skin Care Protocol Initiated - if Braden Score 18 or less Description: If consults are not indicated, leave blank or document N/A Outcome: Progressing Goal: Nutrition Consult-if indicated Outcome: Progressing   Problem: RH BOWEL ELIMINATION Goal: RH STG MANAGE BOWEL WITH ASSISTANCE Description: STG Manage Bowel with Assistance. min Outcome: Progressing   Problem: RH PAIN MANAGEMENT Goal: RH STG PAIN MANAGED AT OR BELOW PT'S PAIN GOAL Description: Less than 4 Outcome: Progressing   Problem: RH BLADDER ELIMINATION Goal: RH STG MANAGE BLADDER WITH MEDICATION WITH ASSISTANCE Description: STG Manage Bladder With Medication With Mod I Assistance. Outcome: Progressing   Problem: RH SKIN INTEGRITY Goal: RH STG MAINTAIN SKIN INTEGRITY WITH ASSISTANCE Description: STG Maintain Skin Integrity With Mod I Assistance. Outcome: Progressing Goal: RH STG ABLE TO PERFORM INCISION/WOUND CARE W/ASSISTANCE Description: STG Able To Perform Incision/Wound Care With Mod I Assistance. Outcome: Progressing   Problem: RH SAFETY Goal: RH STG DECREASED RISK OF FALL WITH ASSISTANCE Description: STG Decreased Risk of Fall With Mod I Assistance. Outcome: Progressing

## 2021-07-01 LAB — CBC WITH DIFFERENTIAL/PLATELET
Abs Immature Granulocytes: 0.02 10*3/uL (ref 0.00–0.07)
Basophils Absolute: 0 10*3/uL (ref 0.0–0.1)
Basophils Relative: 0 %
Eosinophils Absolute: 0.1 10*3/uL (ref 0.0–0.5)
Eosinophils Relative: 3 %
HCT: 34.2 % — ABNORMAL LOW (ref 36.0–46.0)
Hemoglobin: 11.1 g/dL — ABNORMAL LOW (ref 12.0–15.0)
Immature Granulocytes: 1 %
Lymphocytes Relative: 22 %
Lymphs Abs: 0.9 10*3/uL (ref 0.7–4.0)
MCH: 29.8 pg (ref 26.0–34.0)
MCHC: 32.5 g/dL (ref 30.0–36.0)
MCV: 91.7 fL (ref 80.0–100.0)
Monocytes Absolute: 0.5 10*3/uL (ref 0.1–1.0)
Monocytes Relative: 12 %
Neutro Abs: 2.6 10*3/uL (ref 1.7–7.7)
Neutrophils Relative %: 62 %
Platelets: 235 10*3/uL (ref 150–400)
RBC: 3.73 MIL/uL — ABNORMAL LOW (ref 3.87–5.11)
RDW: 15 % (ref 11.5–15.5)
WBC: 4.2 10*3/uL (ref 4.0–10.5)
nRBC: 0 % (ref 0.0–0.2)

## 2021-07-01 MED ORDER — QUETIAPINE FUMARATE 25 MG PO TABS
25.0000 mg | ORAL_TABLET | Freq: Every day | ORAL | Status: DC
  Administered 2021-07-02 – 2021-07-04 (×3): 25 mg via ORAL
  Filled 2021-07-01 (×4): qty 1

## 2021-07-01 NOTE — Progress Notes (Signed)
Occupational Therapy Weekly Progress Note  Patient Details  Name: Melissa Harrington MRN: [id number] Date of Birth: Nov 20, 1970  Beginning of progress report period: June 22, 2021 End of progress report period: July 01, 2021  Today's Date: 07/01/2021 OT Individual Time: 8016-5537 OT Individual Time Calculation (min): 75 min    Patient has met 3 of 3 short term goals. Melissa Harrington has made excellent progress her first week in rehab. She is able to complete UB ADLs with min A and mod-max A with LB with good carryover of adaptive dressing techniques. Her slideboard transfers have improved significantly to min A overall. Cognitively she continues to clear nicely with no concerning behaviors or confusion impacting sessions. She is highly motivated and supported by family.   Patient continues to demonstrate the following deficits: muscle weakness, decreased cardiorespiratoy endurance, decreased motor planning, and decreased sitting balance, decreased standing balance, decreased postural control, and decreased balance strategies and therefore will continue to benefit from skilled OT intervention to enhance overall performance with BADL.  Patient progressing toward long term goals..  Continue plan of care.  OT Short Term Goals Week 1:  OT Short Term Goal 1 (Week 1): Pt will complete BSC transfer with +2 assist OT Short Term Goal 1 - Progress (Week 1): Met OT Short Term Goal 2 (Week 1): Pt will participate in 1 OT session while OOB for ~45 minutes to increase OOB tolerance OT Short Term Goal 2 - Progress (Week 1): Met OT Short Term Goal 3 (Week 1): Pt will complete 1 grooming task with Mod A using adaptive strategies OT Short Term Goal 3 - Progress (Week 1): Met Week 2:  OT Short Term Goal 1 (Week 2): Pt will don LB clothing using LRAD with mod A OT Short Term Goal 2 (Week 2): Pt will transfer to [facility] with CGA OT Short Term Goal 3 (Week 2): Pt will complete toileting tasks with mod A  Skilled Therapeutic  Interventions/Progress Updates:    Pt supine with moderate pain in her R hip, requesting to shower now that her stitches are all out. LUE was occluded for the shower with a bag. Pt completed bed mobility to EOB with (S) using bed rail. Pt completed slideboard transfer from bed to w/c with mod A to get off of crowded sheets/chuck pad but only min A once she was established on the board. Total A for placement but improvement in lateral lean. Discussed d/c planning and date. Pt completed slideboard transfer to the TTB with min A. Min A to doff LB clothing distally d/t hip precautions. Pt completed UB bathing with set up assist, min A for LB to reach distally- recommend use of a LH sponge. Pt transferred back to w/c with min A. UB dressing with supervision. Pt able to use reacher to thread pants over BLE, mod A to pull up in the chair with pt boosting up using arm rests (LUE through elbow). Hair care with set up assist. Pt left sitting up with all needs met.  ABS 14   Therapy Documentation Precautions:  Precautions Precautions: Posterior Hip Precaution Comments: vaginal bleeding from foley trauma Restrictions Weight Bearing Restrictions: Yes RUE Weight Bearing: Weight bearing as tolerated LUE Weight Bearing: Weight bear through elbow only RLE Weight Bearing: Non weight bearing LLE Weight Bearing: Weight bearing as tolerated  Therapy/Group: Individual Therapy  Curtis Sites 07/01/2021, 6:11 AM

## 2021-07-01 NOTE — Progress Notes (Signed)
Physical Therapy TBI Note  Patient Details  Name: Melissa Harrington MRN: [id number] Date of Birth: 02/13/1971  Today's Date: 07/01/2021 PT Individual Time: 1330-1430 PT Individual Time Calculation (min): 60 min   Short Term Goals: Week 1:  PT Short Term Goal 1 (Week 1): Pt will complete supine to sit transfer to EOB with Mod Assist PT Short Term Goal 2 (Week 1): Pt will complete a sit to stand transfer with Mod Assist in setting of weight bearing precautions PT Short Term Goal 3 (Week 1): Pt will tolerate 30 mins of therapy without fatigue  Skilled Therapeutic Interventions/Progress Updates:    Pt received seated in bed, agreeable to PT session. No complaints of pain. Supine to sit with CGA. Slide board transfer to the R into w/c with CGA, pt able to direct therapist in how to assist her with transfer with use of yoga blocks under LLE. Dependent transport to/from therapy gym via w/c. Slide board transfer to/from mat table to the L with min A needed to stabilize yoga blocks during transfer, increased time and assist needed when transferring this direction. Setup L PFRW for patient to attempt standing. Education with patient regarding WBing status in LLE and that sit to stand and stand pivot with RW considered a transfer so patient allowed to perform Yoe in these instances. Pt agreeable to attempt standing this date. Sit to stand x 2 reps to L PFRW with assist x 2 for safety, fair ability to maintain NWBing with RLE and pt unable to push up from [place] with LUE due to shoulder abduction restrictions, able to utilize bracing of LUE forearm on therapist to stand. Pt unable to stand in full upright position due to LLE weakness. Seated BLE strengthening therex: marches, LAQ, ankle pumps x 10 reps each. Pt returned to bed at end of session, min A for SB transfer back to bed. Sit to supine mod A needed for BLE management. Pt left seated in bed with needs in reach, bed alarm in place at end of session.  Therapy  Documentation Precautions:  Precautions Precautions: Posterior Hip Precaution Comments: R posterior hip precautions, LUE WBing through elbow only, LLE WBAT for transfers only Restrictions Weight Bearing Restrictions: Yes RUE Weight Bearing: Weight bearing as tolerated LUE Weight Bearing: Weight bear through elbow only RLE Weight Bearing: Non weight bearing LLE Weight Bearing: Weight bearing as tolerated Agitated Behavior Scale: TBI Observation Details Observation Environment: patient's room, therapy gym Start of observation period - Date: 07/01/21 Start of observation period - Time: 1330 End of observation period - Date: 07/01/21 End of observation period - Time: 1430 Agitated Behavior Scale (DO NOT LEAVE BLANKS) Short attention span, easy distractibility, inability to concentrate: Absent Impulsive, impatient, low tolerance for pain or frustration: Absent Uncooperative, resistant to care, demanding: Absent Violent and/or threatening violence toward people or property: Absent Explosive and/or unpredictable anger: Absent Rocking, rubbing, moaning, or other self-stimulating behavior: Absent Pulling at tubes, restraints, etc.: Absent Wandering from treatment areas: Absent Restlessness, pacing, excessive movement: Absent Repetitive behaviors, motor, and/or verbal: Absent Rapid, loud, or excessive talking: Absent Sudden changes of mood: Absent Easily initiated or excessive crying and/or laughter: Absent Self-abusiveness, physical and/or verbal: Absent Agitated behavior scale total score: 14      Therapy/Group: Individual Therapy  Excell Seltzer, PT, DPT, CSRS  07/01/2021, 5:16 PM

## 2021-07-01 NOTE — Progress Notes (Signed)
Speech Language Pathology Discharge Summary  Patient Details  Name: Melissa Harrington MRN: [id number] Date of Birth: 07/10/1971  Today's Date: 07/01/2021 SLP Individual Time: 0298-4730 SLP Individual Time Calculation (min): 40 min   Skilled Therapeutic Interventions:  Skilled treatment session focused on cognitive goals. SLP facilitated session by providing overall Mod I for anticipatory awareness regarding discharge planning. Patient reports she feels that she is at her cognitive baseline and is more focused on the emotional adjustment that she knows will happen once she is at home. SLP provided emotional support. SLP also discussed a variety of activities/tasks that patient can participate in at home to stay cognitively engaged. Patient verbalized understanding and agreement. Patient left upright in wheelchair with all needs within reach.   Patient has met 4 of 4 long term goals.  Patient to discharge at overall Modified Independent level.   Reasons goals not met: N/A   Clinical Impression/Discharge Summary: Patient has made functional gains and has met 4 of 4 LTGs this admission. Currently, patient demonstrates behaviors consistent with a [place] Level VIII and is overall Mod I to complete functional and mildly complex tasks safely in regards to problem solving, recall, and attention. Patient is also consuming Dys. 3 textures with thin liquids per her preference with minimal overt s/s of aspiration and overall Mod I for use of swallowing compensatory strategies. Education is complete and patient has met all LTGs this admission, therefore, she will be discharged from skilled SLP intervention. She verbalized understanding and agreement.    Care Partner:  Caregiver Able to Provide Assistance: Yes  Type of Caregiver Assistance: Physical  Recommendation:  None      Equipment: N/A   Reasons for discharge: Treatment goals met   Patient/Family Agrees with Progress Made and Goals Achieved: Yes     Derionna Salvador 07/01/2021, 12:05 PM

## 2021-07-01 NOTE — Progress Notes (Signed)
PROGRESS NOTE   Subjective/Complaints: Up in bed. Very pleased to have had a shower! All sutures out--took most of weekend. Having some pain but it's generally tolerable, tingling in right foot.   ROS: Patient denies fever, rash, sore throat, blurred vision, nausea, vomiting, diarrhea, cough, shortness of breath or chest pain,   headache, or mood change.   Objective:   No results found. Recent Labs    07/01/21 0627  WBC 4.2  HGB 11.1*  HCT 34.2*  PLT 235    No results for input(s): NA, K, CL, CO2, GLUCOSE, BUN, CREATININE, CALCIUM in the last 72 hours.  Intake/Output Summary (Last 24 hours) at 07/01/2021 1114 Last data filed at 07/01/2021 0730 Gross per 24 hour  Intake 600 ml  Output --  Net 600 ml     Pressure Injury 06/10/21 Head Lower;Posterior Stage 1 -  Intact skin with non-blanchable redness of a localized area usually over a bony prominence. posterior head; difficult to assess d/t matted hair; approximately 3cm x 3cm? (Active)  06/10/21 0830  Location: Head  Location Orientation: Lower;Posterior  Staging: Stage 1 -  Intact skin with non-blanchable redness of a localized area usually over a bony prominence.  Wound Description (Comments): posterior head; difficult to assess d/t matted hair; approximately 3cm x 3cm?  Present on Admission: No    Physical Exam: Vital Signs Blood pressure (!) 144/91, pulse 91, temperature 98.1 F (36.7 C), temperature source Oral, resp. rate 17, height '5\' 8"'$  (1.727 m), weight 81.1 kg, SpO2 96 %. Constitutional: No distress . Vital signs reviewed. HEENT: NCAT, EOMI, oral membranes moist Neck: supple Cardiovascular: RRR without murmur. No JVD    Respiratory/Chest: CTA Bilaterally without wheezes or rales. Normal effort    GI/Abdomen: BS +, non-tender, non-distended Ext: no clubbing, cyanosis, or edema Psych: pleasant and cooperative  Skin: Warm and dry.  Scattered lacerations,  wounds. All sutures out Psych: Normal mood.  Normal behavior. Musc: No edema in extremities.  No tenderness in extremities. Neuro: Alert Motor: 5/5 in RUE,  LUE: Shoulder abduction 2+/5, distally limited by splint, unchanged, wrapped appropriately RLE 3- Hip and knee ext 4/5 ADF/PF, ?mild weakness right ADF/EHL, sensory loss on top of foot to medial ankle. LLE 3+/5 hip and knee ext 4/5 ADF/PF  Assessment/Plan: 1. Functional deficits which require 3+ hours per day of interdisciplinary therapy in a comprehensive inpatient rehab setting. Physiatrist is providing close team supervision and 24 hour management of active medical problems listed below. Physiatrist and rehab team continue to assess barriers to discharge/monitor patient progress toward functional and medical goals  Care Tool:  Bathing    Body parts bathed by patient: Left arm, Chest, Abdomen, Right upper leg, Left upper leg, Face, Front perineal area   Body parts bathed by helper: Right arm, Buttocks, Right lower leg, Left lower leg     Bathing assist Assist Level: Moderate Assistance - Patient 50 - 74%     Upper Body Dressing/Undressing Upper body dressing   What is the patient wearing?: Pull over shirt    Upper body assist Assist Level: Supervision/Verbal cueing    Lower Body Dressing/Undressing Lower body dressing      What  is the patient wearing?: Incontinence brief     Lower body assist Assist for lower body dressing: Maximal Assistance - Patient 25 - 49%     Toileting Toileting    Toileting assist Assist for toileting: Maximal Assistance - Patient 25 - 49%     Transfers Chair/bed transfer  Transfers assist  Chair/bed transfer activity did not occur: Safety/medical concerns  Chair/bed transfer assist level: Contact Guard/Touching assist     Locomotion Ambulation   Ambulation assist   Ambulation activity did not occur: Safety/medical concerns          Walk 10 feet activity   Assist   Walk 10 feet activity did not occur: Safety/medical concerns (NWB RLE)        Walk 50 feet activity   Assist Walk 50 feet with 2 turns activity did not occur: Safety/medical concerns         Walk 150 feet activity   Assist Walk 150 feet activity did not occur: Safety/medical concerns         Walk 10 feet on uneven surface  activity   Assist Walk 10 feet on uneven surfaces activity did not occur: Safety/medical concerns         Wheelchair     Assist Will patient use wheelchair at discharge?: Yes Type of Wheelchair: Manual Wheelchair activity did not occur: Safety/medical concerns         Wheelchair 50 feet with 2 turns activity    Assist    Wheelchair 50 feet with 2 turns activity did not occur: Safety/medical concerns       Wheelchair 150 feet activity     Assist  Wheelchair 150 feet activity did not occur: Safety/medical concerns       Blood pressure (!) 144/91, pulse 91, temperature 98.1 F (36.7 C), temperature source Oral, resp. rate 17, height '5\' 8"'$  (1.727 m), weight 81.1 kg, SpO2 96 %. Medical Problem List and Plan: 1.  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  Antithrombotics: -DVT/anticoagulation:  Pharmaceutical: Other (comment) Eliquis             -antiplatelet therapy:  N/A 3. Pain Management: Oxycodone  prn  -pt prefers not to use oxydodone  - scheduled tramadol has been effective so far  -encouraged used of kpad -scheduled robaxin qid, ice Controlled with meds on 8/15  -prefers not to treat dysesthesias right foot. ?partial peroneal nerve injury 4. Mood/sleep:  LCSW to follow for evaluation and support.  -melatonin/trazodone             -antipsychotic agents:  seroquel--reduced to '25mg'$  bid--wean to daily  -appreciate neuropsych assessment 5. Neuropsych: This patient is almost capable of making decisions on her own behalf. 6. Skin/Wound Care: Routine pressure relief  measures.              -continue local wound care as needed to multiple op/injury sites   7. Fluids/Electrolytes/Nutrition: Monitor I/Os. 8. Left humerus Fx and Left BBFx s/p ORIF: OK to WB on left elbow. Some callus formation noted on repeat xrays  --ROM as tolerated.  9. Right acetabular /Femur Fx s/p ORIF: NWB RLE with R-THR precautions 10. Open left femur Fx s/p ORIF, left acetab fx and SPR/IPR fx's: WBAT LLE for transfers only. 11. L-open BBFA Fx s/p ORIF: NWB on left wrist 12. Acute blood loss anemia:   Hemoglobin 10.7 on 8/8--> 11.1 8/15  Continue to monitor 13. AKI: Resolved 14. PTSD/Insomnia: added prazosin  as well as melatonin to help with sleep.             --monitor for now.seems to be in good spirits, has supportive family             --continue Seroquel for now             -neuropsych f/u appreciated 15. Urinary retention: voiding, dc'ed urecholine 16. HTN: Monitor BP tid--has been variable. Vitals:   06/30/21 2009 07/01/21 0413  BP: (!) 148/92 (!) 144/91  Pulse: (!) 106 91  Resp: 17 17  Temp: 98.3 F (36.8 C) 98.1 F (36.7 C)  SpO2: 96% 96%   Mildly elevated again 8/15   -observe for nw, pain component    17.  Hypokalemia  Potassium 3.7 on 8/12---continue to supplement daily     LOS: 10 days A FACE TO FACE EVALUATION WAS PERFORMED  Melissa Harrington 07/01/2021, 11:14 AM

## 2021-07-01 NOTE — Progress Notes (Signed)
Physical Therapy Weekly Progress Note  Patient Details  Name: Melissa Harrington MRN: [id number] Date of Birth: [birth year]  Beginning of progress report period: June 22, 2021 End of progress report period: July 01, 2021  Today's Date: 07/01/2021  Patient has met 2 of 3 short term goals.  Pt is making good progress towards therapy goals. Pt is currently mod A for bed mobility, CGA to min A for transfers with slide board, and +2 to stand to [place].   Patient continues to demonstrate the following deficits muscle weakness and muscle joint tightness, decreased cardiorespiratoy endurance, and decreased standing balance, decreased postural control, decreased balance strategies, and difficulty maintaining precautions and therefore will continue to benefit from skilled PT intervention to increase functional independence with mobility.  Patient progressing toward long term goals..  Continue plan of care.  PT Short Term Goals Week 1:  PT Short Term Goal 1 (Week 1): Pt will complete supine to sit transfer to EOB with Mod Assist PT Short Term Goal 1 - Progress (Week 1): Met PT Short Term Goal 2 (Week 1): Pt will complete a sit to stand transfer with Mod Assist in setting of weight bearing precautions PT Short Term Goal 2 - Progress (Week 1): Not met PT Short Term Goal 3 (Week 1): Pt will tolerate 30 mins of therapy without fatigue PT Short Term Goal 3 - Progress (Week 1): Met Week 2:  PT Short Term Goal 1 (Week 2): Pt will perform least restrictive transfer with CGA consistently PT Short Term Goal 2 (Week 2): Pt will be able to perform sit to stand with LRAD and assist x 1 PT Short Term Goal 3 (Week 2): Pt will tolerate sitting OOB x 1 hour   Therapy Documentation Precautions:  Precautions Precautions: Posterior Hip Precaution Comments: vaginal bleeding from foley trauma Restrictions Weight Bearing Restrictions: Yes RUE Weight Bearing: Weight bearing as tolerated LUE Weight Bearing: Weight bear  through elbow only RLE Weight Bearing: Non weight bearing LLE Weight Bearing: Weight bearing as tolerated   Therapy/Group: Individual Therapy  Excell Seltzer, PT, DPT, CSRS 07/01/2021, 8:12 AM

## 2021-07-02 MED ORDER — PRAZOSIN HCL 2 MG PO CAPS
2.0000 mg | ORAL_CAPSULE | Freq: Every day | ORAL | Status: DC
  Administered 2021-07-02 – 2021-07-11 (×10): 2 mg via ORAL
  Filled 2021-07-02 (×10): qty 1

## 2021-07-02 NOTE — Progress Notes (Signed)
Occupational Therapy Session Note  Patient Details  Name: Melissa Harrington MRN: [id number] Date of Birth: Mar 09, 1971  Today's Date: 07/02/2021 OT Individual Time: 1440-1540 OT Individual Time Calculation (min): 60 min    Short Term Goals: Week 2:  OT Short Term Goal 1 (Week 2): Pt will don LB clothing using LRAD with mod A OT Short Term Goal 2 (Week 2): Pt will transfer to [facility] with CGA OT Short Term Goal 3 (Week 2): Pt will complete toileting tasks with mod A  Skilled Therapeutic Interventions/Progress Updates:  Pt greeted supine in bed agreeable to OT intervention. Pt completed bed mobility with CGA and SB transfer to R side with CGA with pt directing level of care with set-up of yoga blocks underneath pts L elbow. Pt transported to therapy gym with total A, remainder of session to focus on transfer training and light therex stretching as pt reports tightness in R ankle and heel cord. Pt completed additionally SB transfer to mat table going towards pts L side with CGA. Pt scooted back onto mat table into long sitting with cues for sequencing and positioning. Pt completed heel cord stretch with theraband positioned around pts R foot, OTA to provide light stretch into dorsiflexion. Pt additionally completed quad sets with RLE with towel underneath pts R knee. Pt reports tightness in low back pt completed trunk lateral flexion to R<>L. Pt completed additional SB transfer back to w/c with CGA. Pt transported to room in similar fashion where left supine in bed with bed alarm activated and all needs within reach.   Therapy Documentation Precautions:  Precautions Precautions: Posterior Hip Precaution Comments: R posterior hip precautions, LUE WBing through elbow only, LLE WBAT for transfers only Restrictions Weight Bearing Restrictions: Yes RUE Weight Bearing: Weight bearing as tolerated LUE Weight Bearing: Weight bear through elbow only RLE Weight Bearing: Non weight bearing LLE Weight Bearing:  Weight bearing as tolerated  Pain: Pt reports mild tightness in RLE and low back, provided light stretches as pain mgmt strategy.    Therapy/Group: Individual Therapy  Corinne Ports [facility] 07/02/2021, 4:01 PM

## 2021-07-02 NOTE — Progress Notes (Signed)
PROGRESS NOTE   Subjective/Complaints: Up going to therapy. No new complaints. Working through pain. Allowed to wb thru LLE with transfers only. Still tingling in right foot  ROS: Patient denies fever, rash, sore throat, blurred vision, nausea, vomiting, diarrhea, cough, shortness of breath or chest pain,  headache, or mood change.   Objective:   No results found. Recent Labs    07/01/21 0627  WBC 4.2  HGB 11.1*  HCT 34.2*  PLT 235    No results for input(s): NA, K, CL, CO2, GLUCOSE, BUN, CREATININE, CALCIUM in the last 72 hours.  Intake/Output Summary (Last 24 hours) at 07/02/2021 1306 Last data filed at 07/02/2021 0730 Gross per 24 hour  Intake 120 ml  Output --  Net 120 ml     Pressure Injury 06/10/21 Head Lower;Posterior Stage 1 -  Intact skin with non-blanchable redness of a localized area usually over a bony prominence. posterior head; difficult to assess d/t matted hair; approximately 3cm x 3cm? (Active)  06/10/21 0830  Location: Head  Location Orientation: Lower;Posterior  Staging: Stage 1 -  Intact skin with non-blanchable redness of a localized area usually over a bony prominence.  Wound Description (Comments): posterior head; difficult to assess d/t matted hair; approximately 3cm x 3cm?  Present on Admission: No    Physical Exam: Vital Signs Blood pressure (!) 150/95, pulse 88, temperature 97.8 F (36.6 C), temperature source Oral, resp. rate 14, height '5\' 8"'$  (1.727 m), weight 81.1 kg, SpO2 97 %. Constitutional: No distress . Vital signs reviewed. HEENT: NCAT, EOMI, oral membranes moist Neck: supple Cardiovascular: RRR without murmur. No JVD    Respiratory/Chest: CTA Bilaterally without wheezes or rales. Normal effort    GI/Abdomen: BS +, non-tender, non-distended Ext: no clubbing, cyanosis, or edema Psych: pleasant and cooperative  Skin: Warm and dry.  Scattered lacerations, wounds head to toe. All  sutures out Psych: Normal mood.  Normal behavior. Musc: No edema in extremities.  No tenderness in extremities. Neuro: Alert Motor: 5/5 in RUE,  LUE: Shoulder abduction 2+/5, distally limited by splint, unchanged, wrapped appropriately RLE 3- Hip and knee ext 4/5 ADF/PF, ?mild weakness right ADF/EHL, sensory loss on top of foot to medial ankle. LLE 3+/5 hip and knee ext 4/5 ADF/PF--stable motor exam  Assessment/Plan: 1. Functional deficits which require 3+ hours per day of interdisciplinary therapy in a comprehensive inpatient rehab setting. Physiatrist is providing close team supervision and 24 hour management of active medical problems listed below. Physiatrist and rehab team continue to assess barriers to discharge/monitor patient progress toward functional and medical goals  Care Tool:  Bathing    Body parts bathed by patient: Left arm, Chest, Abdomen, Right upper leg, Left upper leg, Face, Front perineal area   Body parts bathed by helper: Right arm, Buttocks, Right lower leg, Left lower leg     Bathing assist Assist Level: Moderate Assistance - Patient 50 - 74%     Upper Body Dressing/Undressing Upper body dressing   What is the patient wearing?: Pull over shirt    Upper body assist Assist Level: Supervision/Verbal cueing    Lower Body Dressing/Undressing Lower body dressing      What is  the patient wearing?: Incontinence brief     Lower body assist Assist for lower body dressing: Maximal Assistance - Patient 25 - 49%     Toileting Toileting    Toileting assist Assist for toileting: Maximal Assistance - Patient 25 - 49%     Transfers Chair/bed transfer  Transfers assist  Chair/bed transfer activity did not occur: Safety/medical concerns  Chair/bed transfer assist level: Supervision/Verbal cueing Chair/bed transfer assistive device: Sliding board, Armrests   Locomotion Ambulation   Ambulation assist   Ambulation activity did not occur:  Safety/medical concerns          Walk 10 feet activity   Assist  Walk 10 feet activity did not occur: Safety/medical concerns (NWB RLE)        Walk 50 feet activity   Assist Walk 50 feet with 2 turns activity did not occur: Safety/medical concerns         Walk 150 feet activity   Assist Walk 150 feet activity did not occur: Safety/medical concerns         Walk 10 feet on uneven surface  activity   Assist Walk 10 feet on uneven surfaces activity did not occur: Safety/medical concerns         Wheelchair     Assist Will patient use wheelchair at discharge?: Yes Type of Wheelchair: Manual Wheelchair activity did not occur: Safety/medical concerns  Wheelchair assist level: Supervision/Verbal cueing Max wheelchair distance: 25 ft    Wheelchair 50 feet with 2 turns activity    Assist    Wheelchair 50 feet with 2 turns activity did not occur: Safety/medical concerns       Wheelchair 150 feet activity     Assist  Wheelchair 150 feet activity did not occur: Safety/medical concerns       Blood pressure (!) 150/95, pulse 88, temperature 97.8 F (36.6 C), temperature source Oral, resp. rate 14, height '5\' 8"'$  (1.727 m), weight 81.1 kg, SpO2 97 %. Medical Problem List and Plan: 1.  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  Antithrombotics: -DVT/anticoagulation:  Pharmaceutical: Other (comment) Eliquis             -antiplatelet therapy:  N/A 3. Pain Management: Oxycodone  prn  -pt prefers not to use oxydodone  - scheduled tramadol has been effective so far  -encouraged used of kpad -scheduled robaxin qid, ice Controlled with meds on 8/15-16  -prefers not to treat dysesthesias right foot. ?partial peroneal nerve injury 4. Mood/sleep:  LCSW to follow for evaluation and support.  -melatonin/trazodone             -antipsychotic agents:  seroquel--reduced to '25mg'$  bid--wean to  daily  -appreciate neuropsych assessment 5. Neuropsych: This patient is almost capable of making decisions on her own behalf. 6. Skin/Wound Care: Routine pressure relief measures.              -continue local wound care as needed to multiple op/injury sites   7. Fluids/Electrolytes/Nutrition: Monitor I/Os. 8. Left humerus Fx and Left BBFx s/p ORIF: OK to WB on left elbow. Some callus formation noted on repeat xrays  --ROM as tolerated.  9. Right acetabular /Femur Fx s/p ORIF: NWB RLE with R-THR precautions 10. Open left femur Fx s/p ORIF, left acetab fx and SPR/IPR fx's: WBAT LLE for transfers only. 11. L-open BBFA Fx s/p ORIF: NWB on left wrist 12. Acute blood loss anemia:   Hemoglobin 10.7 on 8/8--> 11.1  8/15  Continue to monitor 13. AKI: Resolved 14. PTSD/Insomnia: added prazosin as well as melatonin to help with sleep.             --monitor for now.seems to be in good spirits, has supportive family             --weaning seroquel---use at bedtime only             -neuropsych f/u appreciated 15. Urinary retention: voiding, dc'ed urecholine 16. HTN: Monitor BP tid--  Vitals:   07/01/21 1946 07/02/21 0429  BP: (!) 141/89 (!) 150/95  Pulse: (!) 102 88  Resp: 16 14  Temp: 98.3 F (36.8 C) 97.8 F (36.6 C)  SpO2: 97% 97%   Mildly elevated again 8/15, 8/16--increase prazosin to '2mg'$  qhs       17.  Hypokalemia  Potassium 3.7 on 8/12---continue to supplement daily     LOS: 11 days A FACE TO FACE EVALUATION WAS PERFORMED  Meredith Staggers 07/02/2021, 1:06 PM

## 2021-07-02 NOTE — Patient Care Conference (Signed)
Inpatient RehabilitationTeam Conference and Plan of Care Update Date: 07/02/2021   Time: 10:06 AM    Patient Name: Melissa Harrington      Medical Record Number: [id number]  Date of Birth: 1971/02/13 Sex: Female         Room/Bed: 4W11C/4W11C-01 Payor Info: Payor: Theme park manager / Plan: [place] / Product Type: *No Product type* /    Admit Date/Time:  06/21/2021  6:26 PM  Primary Diagnosis:  TBI (traumatic brain injury) [facility])  Hospital Problems: Principal Problem:   TBI (traumatic brain injury) ([place]) Active Problems:   Critical polytrauma   Hypokalemia   Elevated blood pressure reading   PTSD (post-traumatic stress disorder)   Postoperative pain   Acute blood loss anemia    Expected Discharge Date: Expected Discharge Date: 07/12/21  Team Members Present: Physician leading conference: Dr. Alger Simons Social Worker Present: Loralee Pacas, [place] Nurse Present: Dorthula Nettles, RN PT Present: Apolinar Junes, PT OT Present: Laverle Hobby, OT SLP Present: Weston Anna, SLP PPS Coordinator present : Gunnar Fusi, SLP     Current Status/Progress Goal Weekly Team Focus  Bowel/Bladder   Pt continet of B/B LBM 07/01/2021  Regular BMs every 3 days or less  Assess B/B every shift   Swallow/Nutrition/ Hydration   Dys, 3 textures with thin liquids (per her preference), Mod I  Mod I  Goal met, D/C from SLP   ADL's   Min A slideboard transfers, min A UB, mod A LB ADLs, mod A toileting tasks  min A overall  ADls, family education, precaution adherence, global strengthening/conditioning   Mobility   Min A for bed mobility, CGA for transfers with slide board, and +2 to stand to [place]  Transfers, activity tolerance, ROM/strengthening, d/c planning, patient/caregiver education   Communication             Safety/Cognition/ Behavioral Observations  Mod I  Supervision A-Mod I  Goal Met, D/C from SLP   Pain   Pt's pain reported as a 3/10 with scheduled  tylenol and tramadol  Continued pain rating of <3/10  Assess pain every shift and PRN   Skin   Healing stage 1 to back of head, healing incisions and healing abrasians to lefs  no new breakdown or S/Sx of infection  Assess skin every shift and PRN     Discharge Planning:  Pt is a recent widow. Pt will have support from her two eldest daughters, and friend [name] that is flying in from [place] to live with them. There will be support from [employer].   Team Discussion: All sutures out, healing well. Continent B/B, pain controlled. Refuses betadine to scalp, MD says to discontinue. Min assist/contact guard slide board, min assist upper body, mod assist lower body. Contact guard/supervision slide board transfers. Practicing WC today. Min assist goals for PT and OT. Patient has been discharged from SLP.  Patient on target to meet rehab goals: yes  *See Care Plan and progress notes for long and short-term goals.   Revisions to Treatment Plan:  Not at this time.  Teaching Needs: Family education, medication management, pain management, skin/wound care, transfer training, slide board training, gait training, weight bearing precautions, WC training, balance training, endurance training, safety awareness.  Current Barriers to Discharge: Decreased caregiver support, Medical stability, Home enviroment access/layout, Wound care, Lack of/limited family support, Weight, Weight bearing restrictions, and Medication compliance  Possible Resolutions to Barriers: Continue current medications, provide emotional support.     Medical Summary Current  Status: reasonable pain control. sutures out. mood stabile, pt working through adjustment. wb increased for transfers only  Barriers to Discharge: Medical stability   Possible Resolutions to [employer] Focus: daily assessment of labs, pt data, pain control   Continued Need for Acute Rehabilitation Level of Care: The patient requires daily medical management by a  physician with specialized training in physical medicine and rehabilitation for the following reasons: Direction of a multidisciplinary physical rehabilitation program to maximize functional independence : Yes Medical management of patient stability for increased activity during participation in an intensive rehabilitation regime.: Yes Analysis of laboratory values and/or radiology reports with any subsequent need for medication adjustment and/or medical intervention. : Yes   I attest that I was present, lead the team conference, and concur with the assessment and plan of the team.   Cristi Loron 07/02/2021, 1:02 PM

## 2021-07-02 NOTE — Progress Notes (Signed)
Occupational Therapy TBI Note  Patient Details  Name: Melissa Harrington MRN: [id number] Date of Birth: Dec 18, 1970  Today's Date: 07/02/2021 OT Individual Time: 1100-1200 OT Individual Time Calculation (min): 60 min    Short Term Goals: Week 2:  OT Short Term Goal 1 (Week 2): Pt will don LB clothing using LRAD with mod A OT Short Term Goal 2 (Week 2): Pt will transfer to [facility] with CGA OT Short Term Goal 3 (Week 2): Pt will complete toileting tasks with mod A  Skilled Therapeutic Interventions/Progress Updates:    Pt received sitting in the w/c with 3/10 pain at rest, it increases with standing activities in her RLE despite adhering to NWB status. Pt completed oral care and grooming tasks at the sink with set up assist. Pt requested to don bra and she required mod A to do so. Pt was taken via w/c to the therapy gym. Pt used slideboard to transfer to the mat with CGA to support equipment. Worked on standing from the mat for hopeful carryover to toileting tasks in standing. Mod A each stand with second person support weightbearing through the L elbow. Pt able to stand from 45 sec-1 min with min A, however poor compliance to RLE NWB- pt aware. Used therapists foot under the RLE to monitor weightbearing. Pt returned to the w/c and to her room. She was left sitting up with all needs met.   Therapy Documentation Precautions:  Precautions Precautions: Posterior Hip Precaution Comments: R posterior hip precautions, LUE WBing through elbow only, LLE WBAT for transfers only Restrictions Weight Bearing Restrictions: Yes RUE Weight Bearing: Weight bearing as tolerated LUE Weight Bearing: Weight bear through elbow only RLE Weight Bearing: Non weight bearing LLE Weight Bearing: Weight bearing as tolerated Agitated Behavior Scale: TBI Observation Details Observation Environment: CIR Start of observation period - Date: 07/02/21 Start of observation period - Time: 1100 End of observation period - Date:  07/02/21 End of observation period - Time: 1200 Agitated Behavior Scale (DO NOT LEAVE BLANKS) Short attention span, easy distractibility, inability to concentrate: Absent Impulsive, impatient, low tolerance for pain or frustration: Absent Uncooperative, resistant to care, demanding: Absent Violent and/or threatening violence toward people or property: Absent Explosive and/or unpredictable anger: Absent Rocking, rubbing, moaning, or other self-stimulating behavior: Absent Pulling at tubes, restraints, etc.: Absent Wandering from treatment areas: Absent Restlessness, pacing, excessive movement: Absent Repetitive behaviors, motor, and/or verbal: Absent Rapid, loud, or excessive talking: Absent Sudden changes of mood: Absent Easily initiated or excessive crying and/or laughter: Absent Self-abusiveness, physical and/or verbal: Absent Agitated behavior scale total score: 14   Therapy/Group: Individual Therapy  Curtis Sites 07/02/2021, 12:26 PM

## 2021-07-02 NOTE — Progress Notes (Signed)
Patient ID: Melissa Harrington, female   DOB: [birth year], 50 y.o.   MRN: [id number]   SW met with pt and pt friend in room to provide updates from team conference, and d/c date 8/26. SW discussed with pt when her friend [name] would be coming. She reported she was arriving this weekend sometime. States that they are also working on building the ramp at home. She will confirm with SW family edu day once she discusses with family.  SW spoke with pt dtr [name] [phone]) to provide above updates. SW informed if HH is needed will f/u. SW encouraged her to call if any questions/concerns.   Loralee Pacas, MSW, [place] Office: [phone] Cell: [phone] Fax: [phone]

## 2021-07-02 NOTE — Progress Notes (Signed)
Physical Therapy Session Note  Patient Details  Name: Melissa Harrington MRN: [id number] Date of Birth: 1971/02/09  Today's Date: 07/02/2021 PT Individual Time: 1001-1030 PT Individual Time Calculation (min): 29 min   Short Term Goals: Week 2:  PT Short Term Goal 1 (Week 2): Pt will perform least restrictive transfer with CGA consistently PT Short Term Goal 2 (Week 2): Pt will be able to perform sit to stand with LRAD and assist x 1 PT Short Term Goal 3 (Week 2): Pt will tolerate sitting OOB x 1 hour  Skilled Therapeutic Interventions/Progress Updates:     Pt received supine in bed and agrees to therapy. Reports some pain in R leg primarily. Number not provided. Reports she had taken pain meds recently. PT provides repositioning and rest breaks to manage pain. Pt performs supine to sit with HOB elevated and with use of bed rails, but no physical assistance required. PT dons L TED hose and shoe prior to mobility. Pt uses slideboard to transfer to the R from bed to [facility], going slightly uphill and requiring CGA/minA, with cues for body mechanics and positioning. WC transport to gym for time management. Pt performs slideboard transfer from [facility] to mat table with CGA. Pt attempts sit to stand from mat table to [place] but is unable to achieve on first attempt, saying that she requires more assistance through L elbow. With PT supporting L elbow to allow pt to push through L upper extremity, pt performs sit to stand with modA overall, and remains standing for ~30 seconds. Pt forward flexed and bearing down through [place], requiring multimodal cues for pursed lip breathing, hip and trunk extension. Follow seated rest break, pt performs transfer 2nd rep with modA and able to maintain standing balance for 1 minute with minA at hips for stability due to slight posterior bias. Improved body mechanics and posture and 2nd bout of standing. Pt performs slideboard transfer back to [facility] with CGA. Left seated with alarm intact and  all needs within reach.  Therapy Documentation Precautions:  Precautions Precautions: Posterior Hip Precaution Comments: R posterior hip precautions, LUE WBing through elbow only, LLE WBAT for transfers only Restrictions Weight Bearing Restrictions: Yes RUE Weight Bearing: Weight bearing as tolerated LUE Weight Bearing: Weight bear through elbow only RLE Weight Bearing: Non weight bearing LLE Weight Bearing: Weight bearing as tolerated   Therapy/Group: Individual Therapy  Breck Coons, PT, DPT 07/02/2021, 11:45 AM

## 2021-07-02 NOTE — Progress Notes (Signed)
Physical Therapy TBI Note  Patient Details  Name: Melissa Harrington MRN: [id number] Date of Birth: [birth year]  Today's Date: 07/02/2021 PT Individual Time: 0801-0906 PT Individual Time Calculation (min): 65 min   Short Term Goals: Week 2:  PT Short Term Goal 1 (Week 2): Pt will perform least restrictive transfer with CGA consistently PT Short Term Goal 2 (Week 2): Pt will be able to perform sit to stand with LRAD and assist x 1 PT Short Term Goal 3 (Week 2): Pt will tolerate sitting OOB x 1 hour  Skilled Therapeutic Interventions/Progress Updates:     Patient in bed with NT in the room assisting patient with donning pants upon PT arrival. Patient alert and agreeable to PT session. Patient denied pain during session, reported "generally sore."  Discussed patient's POC, PT goals, d/c planning, home set-up, precautions, and mental health. Patient reports a great support system from a friend and her daughters. Reports her friend will be here later this week and available for education prior to d/c next week. Stated that a ramp is bing put in and that she will be going home in an SUV. Confirmed with Dr. Naaman Plummer during rounding that patient is cleared to progress to sit to stand and stand pivot transfers maintaining weight bearing precautions.   Therapeutic Activity: Bed Mobility: Patient performed supine to/from sit with CGA with use of hospital bed features to come to sitting. Provided verbal cues for reducing use of hospital features at tolerated to progress to home set-up. Transfers: Patient performed a slide board transfer bed<>w/c, w/c<>mat table with set-up and supervision and total-max A (able to assist with placement on R) for board placement. Provided cues for hand placement, board placement, and head-hips relationship for proper technique and decreased assist with transfers.   Wheelchair Mobility:  Retrieved a manual w/c to switch patient from TIS w/c for increased independence with w/c  mobility. Patient propelled wheelchair 25 feet x2 with supervision using R upper and L lower extremities. Provided verbal cues for propulsion and turning technique. Adjusted B ELRs and educated on manual w/c parts management, patient able to lock-unlock breaks with R hand and move both arm rests. Unable to don/doff ELRs at this time.  Patient in bed at end of session with breaks locked, bed alarm set, and all needs within reach.   Therapy Documentation Precautions:  Precautions Precautions: Posterior Hip Precaution Comments: R posterior hip precautions, LUE WBing through elbow only, LLE WBAT for transfers only Restrictions Weight Bearing Restrictions: Yes RUE Weight Bearing: Weight bearing as tolerated LUE Weight Bearing: Weight bear through elbow only RLE Weight Bearing: Non weight bearing LLE Weight Bearing: Weight bearing as tolerated  Agitated Behavior Scale: TBI Observation Details Observation Environment: CIR Start of observation period - Date: 07/02/21 Start of observation period - Time: 1100 End of observation period - Date: 07/02/21 End of observation period - Time: 1200 Agitated Behavior Scale (DO NOT LEAVE BLANKS) Short attention span, easy distractibility, inability to concentrate: Absent Impulsive, impatient, low tolerance for pain or frustration: Absent Uncooperative, resistant to care, demanding: Absent Violent and/or threatening violence toward people or property: Absent Explosive and/or unpredictable anger: Absent Rocking, rubbing, moaning, or other self-stimulating behavior: Absent Pulling at tubes, restraints, etc.: Absent Wandering from treatment areas: Absent Restlessness, pacing, excessive movement: Absent Repetitive behaviors, motor, and/or verbal: Absent Rapid, loud, or excessive talking: Absent Sudden changes of mood: Absent Easily initiated or excessive crying and/or laughter: Absent Self-abusiveness, physical and/or verbal: Absent Agitated behavior  scale total score:  14    Therapy/Group: Individual Therapy  Andres Escandon L Sophiarose Eades PT, DPT  07/02/2021, 12:28 PM

## 2021-07-03 NOTE — Progress Notes (Signed)
Physical Therapy TBI Note  Patient Details  Name: Melissa Harrington MRN: [id number] Date of Birth: 05-27-1971  Today's Date: 07/03/2021 PT Individual Time: 1345-1455 PT Individual Time Calculation (min): 70 min   Short Term Goals: Week 2:  PT Short Term Goal 1 (Week 2): Pt will perform least restrictive transfer with CGA consistently PT Short Term Goal 2 (Week 2): Pt will be able to perform sit to stand with LRAD and assist x 1 PT Short Term Goal 3 (Week 2): Pt will tolerate sitting OOB x 1 hour  Skilled Therapeutic Interventions/Progress Updates:     Patient in bed with her daughter present upon PT arrival. Patient alert and agreeable to PT session. Patient denied pain during session. Patient's daughter participated in observation and family education throughout session to determine household set-up, car height, and educated on assisting patient with ROM stretching and exercise.   Therapeutic Activity: Bed Mobility: Patient performed supine to/from sit with CGA-min A for lower extremity management in a flat bed without use of bed rails and on a mat table. Utilized yoga blocks to push-up with L elbow to maintain precautions while simulating home set-up. Provided verbal cues for rolling to side-lying and bringing knees to chest to bring legs off/on the bed. Transfers: Patient performed sit to/from stand x2 in the // bars with mod A +2, patient supported at her L elbow to push up and maintain balance in standing. Stood for 20-30 sec each trial. Provided verbal cues for erect posture, gluteal activation, NWB on R (PT's foot under patient's to gage if weight bearing throughout), and diaphragmatic breathing for pain/anxiety management while standing. Patient unable to straighten her L knee in standing and increased difficulty maintaining non-weight bearing on second trial.  Patient performed slide board transfers w/c<>mat table with supervision and set-up assist for w/c and board placement. Provided min  cues for hand placement, board placement, and head-hips relationship for proper technique and decreased assist with transfers.   Therapeutic Exercise: Patient performed the following exercises with verbal and tactile cues for proper technique. Lying flat in supine for >15 min for improved hip, knee, and L elbow extension: -placed towel roll under L forearm and moved it out to increase elbow extension with gravity to patient tolerance, attained ~160 deg extension limited by pain -R knee extension with prolonged stretch with towel roll under ankle and supporting back of knee to calm hamstring spasms, added DF after 2 min, lacking at least 5 degrees knee extension and DF ROM -L knee extension as above, able to get to full extension with prolonged stretch -R hip/knee flexion stretch 2x10 sec PROM, limited to grossly 60 deg hip and knee flexion in supine -L hip knee flexion stretch 2x10 with minimal limitation with PROM  Patient in w/c with her daughter and LPN in the room at end of session with breaks locked and all needs within reach.   Therapy Documentation Precautions:  Precautions Precautions: Posterior Hip Precaution Comments: R posterior hip precautions, LUE WBing through elbow only, LLE WBAT for transfers only Restrictions Weight Bearing Restrictions: Yes RUE Weight Bearing: Weight bearing as tolerated LUE Weight Bearing: Weight bear through elbow only RLE Weight Bearing: Non weight bearing LLE Weight Bearing: Weight bearing as tolerated  Agitated Behavior Scale: TBI Observation Details Observation Environment: CIR Start of observation period - Date: 07/03/21 Start of observation period - Time: [id number] End of observation period - Date: 07/03/21 End of observation period - Time: 1455 Agitated Behavior Scale (DO NOT LEAVE BLANKS)  Short attention span, easy distractibility, inability to concentrate: Absent Impulsive, impatient, low tolerance for pain or frustration:  Absent Uncooperative, resistant to care, demanding: Absent Violent and/or threatening violence toward people or property: Absent Explosive and/or unpredictable anger: Absent Rocking, rubbing, moaning, or other self-stimulating behavior: Absent Pulling at tubes, restraints, etc.: Absent Wandering from treatment areas: Absent Restlessness, pacing, excessive movement: Absent Repetitive behaviors, motor, and/or verbal: Absent Rapid, loud, or excessive talking: Absent Sudden changes of mood: Absent Easily initiated or excessive crying and/or laughter: Absent Self-abusiveness, physical and/or verbal: Absent Agitated behavior scale total score: 14     Therapy/Group: Individual Therapy  Stylianos Stradling L Dravin Lance PT, DPT  07/03/2021, 5:10 PM

## 2021-07-03 NOTE — Progress Notes (Signed)
Occupational Therapy TBI Note  Patient Details  Name: Melissa Harrington MRN: [id number] Date of Birth: 04-18-1971  Today's Date: 07/03/2021 OT Individual Time: 7048-8891 OT Individual Time Calculation (min): 45 min    Short Term Goals: Week 2:  OT Short Term Goal 1 (Week 2): Pt will don LB clothing using LRAD with mod A OT Short Term Goal 2 (Week 2): Pt will transfer to [facility] with CGA OT Short Term Goal 3 (Week 2): Pt will complete toileting tasks with mod A  Skilled Therapeutic Interventions/Progress Updates:    Pt supine calling out for bathroom assist. Pt completed bed mobility with superivison using bed features. CGA slideboard transfer to the bariatric BSC. Pt required mod A for toileting tasks- to doff brief and to manage slideboard removal. Pt able to void urine and complete peri hygiene. Pt completed transfer back to bed with CGA. She was able to use the reacher to thread pants over BLE, mod A to pull up over hips. Pt completed slideboard transfer to the w/c with min A. Pt completed grooming tasks at the sink with set up assist. UB ADLs with min A to pull down shirt in the back. Pt was left sitting up in w/c with all needs met.   Therapy Documentation Precautions:  Precautions Precautions: Posterior Hip Precaution Comments: R posterior hip precautions, LUE WBing through elbow only, LLE WBAT for transfers only Restrictions Weight Bearing Restrictions: Yes RUE Weight Bearing: Weight bearing as tolerated LUE Weight Bearing: Weight bear through elbow only RLE Weight Bearing: Non weight bearing LLE Weight Bearing: Weight bearing as tolerated Agitated Behavior Scale: TBI Observation Details Observation Environment: pt room Start of observation period - Date: 07/03/21 Start of observation period - Time: 0730 End of observation period - Date: 07/03/21 End of observation period - Time: 0815 Agitated Behavior Scale (DO NOT LEAVE BLANKS) Short attention span, easy distractibility,  inability to concentrate: Absent Impulsive, impatient, low tolerance for pain or frustration: Absent Uncooperative, resistant to care, demanding: Absent Violent and/or threatening violence toward people or property: Absent Explosive and/or unpredictable anger: Absent Rocking, rubbing, moaning, or other self-stimulating behavior: Absent Pulling at tubes, restraints, etc.: Absent Wandering from treatment areas: Absent Restlessness, pacing, excessive movement: Absent Repetitive behaviors, motor, and/or verbal: Absent Rapid, loud, or excessive talking: Absent Sudden changes of mood: Absent Easily initiated or excessive crying and/or laughter: Absent Self-abusiveness, physical and/or verbal: Absent Agitated behavior scale total score: 14    Therapy/Group: Individual Therapy  Curtis Sites 07/03/2021, 9:36 AM

## 2021-07-03 NOTE — Progress Notes (Signed)
Pt refused betadine to scab on posterior head. Pt educated on wound care. Encouraged use of donut for pressure relief.  Sheela Stack, LPN

## 2021-07-03 NOTE — Progress Notes (Signed)
Occupational Therapy TBI Note  Patient Details  Name: Melissa Harrington MRN: [id number] Date of Birth: 09/11/1971  Today's Date: 07/03/2021 OT Individual Time: 1000-1100 OT Individual Time Calculation (min): 60 min    Short Term Goals: Week 1:  OT Short Term Goal 1 (Week 1): Pt will complete BSC transfer with +2 assist OT Short Term Goal 1 - Progress (Week 1): Met OT Short Term Goal 2 (Week 1): Pt will participate in 1 OT session while OOB for ~45 minutes to increase OOB tolerance OT Short Term Goal 2 - Progress (Week 1): Met OT Short Term Goal 3 (Week 1): Pt will complete 1 grooming task with Mod A using adaptive strategies OT Short Term Goal 3 - Progress (Week 1): Met  Skilled Therapeutic Interventions/Progress Updates:     Pt received in bed with 3 out of 10 pain in R knee. Deep breathing techniques and body scan/mindfulness provided for pain relief  Therapeutic activity Pt participates in adaptive yoga sequence per pt request along with guided mountain meditation at end of session for mindfulness and coping.   Seated poses included: Cervial stretches Trunk stretches Hamstring stretches Reciprocal scooting UE stretches Core activation exercises with block  Cat/cow  Pt left at end of session in bed with exit alarm on, call light in reach and all needs met  Session 2 40 min 1520-1600  time  Pt received in w/c with daughter present with no pain reported. Pt requesting to go outside  Therapeutic activity While in outside courtyard for mood support, OT Facilitated discussion/education on return to work, CLOF, progress, current plan of care, home modifications and education about community resources for adaptive yoga/fitness as well as education on Love Your Brain Mindset program as a free program to complete once precautions are lifted. Pt given handout with yoga pposes from earlier session as well as print out on Love your brain. Returned to room in w/c  Pt left at end of  session in w/c with exit alarm on, call light in reach and all needs met   Therapy Documentation Precautions:  Precautions Precautions: Posterior Hip Precaution Comments: R posterior hip precautions, LUE WBing through elbow only, LLE WBAT for transfers only Restrictions Weight Bearing Restrictions: Yes RUE Weight Bearing: Weight bearing as tolerated LUE Weight Bearing: Weight bear through elbow only RLE Weight Bearing: Non weight bearing LLE Weight Bearing: Weight bearing as tolerated General:   Vital Signs:  Pain: Pain Assessment Pain Scale: 0-10 Pain Score: 0-No pain Agitated Behavior Scale: TBI  Observation Details Observation Environment: CIR Start of observation period - Date: 07/03/21 Start of observation period - Time: 1000 End of observation period - Date: 07/03/21 End of observation period - Time: 1100 Agitated Behavior Scale (DO NOT LEAVE BLANKS) Short attention span, easy distractibility, inability to concentrate: Absent Impulsive, impatient, low tolerance for pain or frustration: Absent Uncooperative, resistant to care, demanding: Absent Violent and/or threatening violence toward people or property: Absent Explosive and/or unpredictable anger: Absent Rocking, rubbing, moaning, or other self-stimulating behavior: Absent Pulling at tubes, restraints, etc.: Absent Wandering from treatment areas: Absent Restlessness, pacing, excessive movement: Absent Repetitive behaviors, motor, and/or verbal: Absent Rapid, loud, or excessive talking: Absent Sudden changes of mood: Absent Easily initiated or excessive crying and/or laughter: Absent Self-abusiveness, physical and/or verbal: Absent Agitated behavior scale total score: 14  ADL: ADL Eating: Not assessed Grooming: Maximal assistance Where Assessed-Grooming: Edge of bed Upper Body Bathing: Moderate assistance Where Assessed-Upper Body Bathing: Edge of bed Lower Body Bathing: Dependent,   Other (comment) (2  helpers) Where Assessed-Lower Body Bathing: Edge of bed, Bed level Upper Body Dressing: Moderate assistance Where Assessed-Upper Body Dressing: Edge of bed Lower Body Dressing: Dependent, Other (Comment) (2 helpers) Where Assessed-Lower Body Dressing: Bed level, Edge of bed Toileting: Not assessed, Dependent, Other (Comment) (2 helpers) Where Assessed-Toileting: Bed level Toilet Transfer: Not assessed Tub/Shower Transfer: Not assessed Vision   Perception    Praxis   Exercises:   Other Treatments:     Therapy/Group: Individual Therapy  Tonny Branch 07/03/2021, 11:18 AM

## 2021-07-03 NOTE — Progress Notes (Signed)
PROGRESS NOTE   Subjective/Complaints: Pain improving. Using less tylenol despite moving more with therapies. Mood fairly positive.   ROS: Patient denies fever, rash, sore throat, blurred vision, nausea, vomiting, diarrhea, cough, shortness of breath or chest pain,  headache, or mood change.   Objective:   No results found. Recent Labs    07/01/21 0627  WBC 4.2  HGB 11.1*  HCT 34.2*  PLT 235    No results for input(s): NA, K, CL, CO2, GLUCOSE, BUN, CREATININE, CALCIUM in the last 72 hours.  Intake/Output Summary (Last 24 hours) at 07/03/2021 0830 Last data filed at 07/02/2021 1802 Gross per 24 hour  Intake 120 ml  Output --  Net 120 ml     Pressure Injury 06/10/21 Head Lower;Posterior Stage 1 -  Intact skin with non-blanchable redness of a localized area usually over a bony prominence. posterior head; difficult to assess d/t matted hair; approximately 3cm x 3cm? (Active)  06/10/21 0830  Location: Head  Location Orientation: Lower;Posterior  Staging: Stage 1 -  Intact skin with non-blanchable redness of a localized area usually over a bony prominence.  Wound Description (Comments): posterior head; difficult to assess d/t matted hair; approximately 3cm x 3cm?  Present on Admission: No    Physical Exam: Vital Signs Blood pressure (!) 144/92, pulse 90, temperature 98 F (36.7 C), temperature source Oral, resp. rate 14, height '5\' 8"'$  (1.727 m), weight 81.1 kg, SpO2 96 %. Constitutional: No distress . Vital signs reviewed. HEENT: NCAT, EOMI, oral membranes moist Neck: supple Cardiovascular: RRR without murmur. No JVD    Respiratory/Chest: CTA Bilaterally without wheezes or rales. Normal effort    GI/Abdomen: BS +, non-tender, non-distended Ext: no clubbing, cyanosis, or edema Psych: pleasant and cooperative  Skin: Warm and dry.  Scattered lacerations, wounds head to toe. All sutures out. Areas on scalp are dry/eschar  along crown and occiput Psych: Normal mood.  Normal behavior. Musc: No edema in extremities.  No tenderness in extremities. Neuro: Alert Motor: 5/5 in RUE,  LUE: Shoulder abduction 2+/5, distally limited by splint, unchanged, wrapped appropriately RLE 3- Hip and knee ext 4/5 ADF/PF, ?mild weakness right ADF/EHL, sensory loss on top of foot to medial ankle. LLE 3+/5 hip and knee ext 4/5 ADF/PF--stable exam  Assessment/Plan: 1. Functional deficits which require 3+ hours per day of interdisciplinary therapy in a comprehensive inpatient rehab setting. Physiatrist is providing close team supervision and 24 hour management of active medical problems listed below. Physiatrist and rehab team continue to assess barriers to discharge/monitor patient progress toward functional and medical goals  Care Tool:  Bathing    Body parts bathed by patient: Left arm, Chest, Abdomen, Right upper leg, Left upper leg, Face, Front perineal area   Body parts bathed by helper: Right arm, Buttocks, Right lower leg, Left lower leg     Bathing assist Assist Level: Moderate Assistance - Patient 50 - 74%     Upper Body Dressing/Undressing Upper body dressing   What is the patient wearing?: Pull over shirt    Upper body assist Assist Level: Supervision/Verbal cueing    Lower Body Dressing/Undressing Lower body dressing      What is the  patient wearing?: Incontinence brief     Lower body assist Assist for lower body dressing: Maximal Assistance - Patient 25 - 49%     Toileting Toileting    Toileting assist Assist for toileting: Maximal Assistance - Patient 25 - 49%     Transfers Chair/bed transfer  Transfers assist  Chair/bed transfer activity did not occur: Safety/medical concerns  Chair/bed transfer assist level: Contact Guard/Touching assist Chair/bed transfer assistive device: Sliding board, Armrests   Locomotion Ambulation   Ambulation assist   Ambulation activity did not occur:  Safety/medical concerns          Walk 10 feet activity   Assist  Walk 10 feet activity did not occur: Safety/medical concerns        Walk 50 feet activity   Assist Walk 50 feet with 2 turns activity did not occur: Safety/medical concerns         Walk 150 feet activity   Assist Walk 150 feet activity did not occur: Safety/medical concerns         Walk 10 feet on uneven surface  activity   Assist Walk 10 feet on uneven surfaces activity did not occur: Safety/medical concerns         Wheelchair     Assist Will patient use wheelchair at discharge?: Yes Type of Wheelchair: Manual Wheelchair activity did not occur: Safety/medical concerns  Wheelchair assist level: Supervision/Verbal cueing Max wheelchair distance: 25 ft    Wheelchair 50 feet with 2 turns activity    Assist    Wheelchair 50 feet with 2 turns activity did not occur: Safety/medical concerns       Wheelchair 150 feet activity     Assist  Wheelchair 150 feet activity did not occur: Safety/medical concerns       Blood pressure (!) 144/92, pulse 90, temperature 98 F (36.7 C), temperature source Oral, resp. rate 14, height '5\' 8"'$  (1.727 m), weight 81.1 kg, SpO2 96 %. Medical Problem List and Plan: 1.  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 Making progress toward goals 2.  Antithrombotics: -DVT/anticoagulation:  Pharmaceutical: Other (comment) Eliquis             -antiplatelet therapy:  N/A 3. Pain Management: Oxycodone  prn  -pt prefers not to use oxydodone  - scheduled tramadol has been effective so far  -encouraged used of kpad -scheduled robaxin qid, ice Controlled with meds on 8/15-16  -prefers not to treat dysesthesias right foot. ?partial peroneal nerve injury 4. Mood/sleep:  LCSW to follow for evaluation and support.  -melatonin/trazodone             -antipsychotic agents:  seroquel--reduced to '25mg'$  bid--wean  to daily  -appreciate neuropsych assessment 5. Neuropsych: This patient is almost capable of making decisions on her own behalf. 6. Skin/Wound Care: Routine pressure relief measures.              -continue local wound care as needed to multiple op/injury sites -not recommending any treatments to scalp wounds  7. Fluids/Electrolytes/Nutrition: Monitor I/Os. 8. Left humerus Fx and Left BBFx s/p ORIF: OK to WB on left elbow. Some callus formation noted on repeat xrays  --ROM as tolerated.  9. Right acetabular /Femur Fx s/p ORIF: NWB RLE with R-THR precautions 10. Open left femur Fx s/p ORIF, left acetab fx and SPR/IPR fx's: WBAT LLE for transfers only. 11. L-open BBFA Fx s/p ORIF: NWB on left wrist 12. Acute blood loss anemia:  Hemoglobin 10.7 on 8/8--> 11.1 8/15  Continue to monitor 13. AKI: Resolved 14. PTSD/Insomnia: added prazosin as well as melatonin to help with sleep.             --monitor for now.seems to be in good spirits, has supportive family             --weaning seroquel---use at bedtime only             -neuropsych f/u appreciated 15. Urinary retention: voiding, dc'ed urecholine 16. HTN: Monitor BP tid--  Vitals:   07/02/21 2139 07/03/21 0500  BP: 130/83 (!) 144/92  Pulse: 93 90  Resp: 18 14  Temp: 97.7 F (36.5 C) 98 F (36.7 C)  SpO2: 99% 96%   Mildly elevated again 8/15, 8/16--increased prazosin to '2mg'$  qhs  observe     17.  Hypokalemia  Potassium 3.7 on 8/12---continue to supplement daily     LOS: 12 days A FACE TO FACE EVALUATION WAS PERFORMED  Meredith Staggers 07/03/2021, 8:30 AM
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
## 2021-07-04 NOTE — Progress Notes (Signed)
Recreational Therapy Session Note  Patient Details  Name: Melissa Harrington MRN: [id number] Date of Birth: Jan 18, 1971 Today's Date: 07/04/2021  Pain:c/o of soreness from stretching & yoga yesterday Skilled Therapeutic Interventions/Progress Updates: Session focused on discharge planning, slide board transfers including car transfers in preparation for discharge and follow up appointments.  LRT discussed the incorporation of deep breathing to reduce anxiety before and during movement.  Pt stated understanding and utilized this strategy during this session with min cues.  Therapy/Group: Co-Treatment   Ruchama Kubicek 07/04/2021, 12:46 PM

## 2021-07-04 NOTE — Progress Notes (Signed)
PROGRESS NOTE   Subjective/Complaints: No new complaints. Pain controlled. Enjoyed yoga yesterday.   ROS: Patient denies fever, rash, sore throat, blurred vision, nausea, vomiting, diarrhea, cough, shortness of breath or chest pain, headache, or mood change.   Objective:   No results found. No results for input(s): WBC, HGB, HCT, PLT in the last 72 hours.   No results for input(s): NA, K, CL, CO2, GLUCOSE, BUN, CREATININE, CALCIUM in the last 72 hours.  Intake/Output Summary (Last 24 hours) at 07/04/2021 1217 Last data filed at 07/04/2021 0900 Gross per 24 hour  Intake 880 ml  Output --  Net 880 ml     Pressure Injury 06/10/21 Head Lower;Posterior Stage 1 -  Intact skin with non-blanchable redness of a localized area usually over a bony prominence. posterior head; difficult to assess d/t matted hair; approximately 3cm x 3cm? (Active)  06/10/21 0830  Location: Head  Location Orientation: Lower;Posterior  Staging: Stage 1 -  Intact skin with non-blanchable redness of a localized area usually over a bony prominence.  Wound Description (Comments): posterior head; difficult to assess d/t matted hair; approximately 3cm x 3cm?  Present on Admission: No    Physical Exam: Vital Signs Blood pressure (!) 152/92, pulse 86, temperature 98.7 F (37.1 C), temperature source Oral, resp. rate 16, height '5\' 8"'$  (1.727 m), weight 81.1 kg, SpO2 96 %. Constitutional: No distress . Vital signs reviewed. HEENT: NCAT, EOMI, oral membranes moist Neck: supple Cardiovascular: RRR without murmur. No JVD    Respiratory/Chest: CTA Bilaterally without wheezes or rales. Normal effort    GI/Abdomen: BS +, non-tender, non-distended Ext: no clubbing, cyanosis, or edema Psych: pleasant and cooperative  Skin: Warm and dry.  Scattered lacerations, wounds head to toe. All sutures out. Areas on scalp are dry/eschar along crown and occiput--stable to  improved Psych: Normal mood.  Normal behavior. Musc: No edema in extremities.  Left heel cord tight. Neuro: Alert Motor: 5/5 in RUE,  LUE: Shoulder abduction 2+/5, distally limited by splint, unchanged, wrapped appropriately RLE 3- Hip and knee ext 4/5 ADF/PF, ?mild weakness right ADF/EHL, sensory loss on top of foot to medial ankle. LLE 3+/5 hip and knee ext 4/5 ADF/PF--no motor changes  Assessment/Plan: 1. Functional deficits which require 3+ hours per day of interdisciplinary therapy in a comprehensive inpatient rehab setting. Physiatrist is providing close team supervision and 24 hour management of active medical problems listed below. Physiatrist and rehab team continue to assess barriers to discharge/monitor patient progress toward functional and medical goals  Care Tool:  Bathing    Body parts bathed by patient: Left arm, Chest, Abdomen, Right upper leg, Left upper leg, Face, Front perineal area   Body parts bathed by helper: Right arm, Buttocks, Right lower leg, Left lower leg     Bathing assist Assist Level: Moderate Assistance - Patient 50 - 74%     Upper Body Dressing/Undressing Upper body dressing   What is the patient wearing?: Pull over shirt    Upper body assist Assist Level: Supervision/Verbal cueing    Lower Body Dressing/Undressing Lower body dressing      What is the patient wearing?: Incontinence brief     Lower body  assist Assist for lower body dressing: Maximal Assistance - Patient 25 - 49%     Toileting Toileting    Toileting assist Assist for toileting: Maximal Assistance - Patient 25 - 49%     Transfers Chair/bed transfer  Transfers assist  Chair/bed transfer activity did not occur: Safety/medical concerns  Chair/bed transfer assist level: Contact Guard/Touching assist Chair/bed transfer assistive device: Sliding board, Armrests   Locomotion Ambulation   Ambulation assist   Ambulation activity did not occur: Safety/medical  concerns          Walk 10 feet activity   Assist  Walk 10 feet activity did not occur: Safety/medical concerns        Walk 50 feet activity   Assist Walk 50 feet with 2 turns activity did not occur: Safety/medical concerns         Walk 150 feet activity   Assist Walk 150 feet activity did not occur: Safety/medical concerns         Walk 10 feet on uneven surface  activity   Assist Walk 10 feet on uneven surfaces activity did not occur: Safety/medical concerns         Wheelchair     Assist Will patient use wheelchair at discharge?: Yes Type of Wheelchair: Manual Wheelchair activity did not occur: Safety/medical concerns  Wheelchair assist level: Supervision/Verbal cueing Max wheelchair distance: 25 ft    Wheelchair 50 feet with 2 turns activity    Assist    Wheelchair 50 feet with 2 turns activity did not occur: Safety/medical concerns       Wheelchair 150 feet activity     Assist  Wheelchair 150 feet activity did not occur: Safety/medical concerns       Blood pressure (!) 152/92, pulse 86, temperature 98.7 F (37.1 C), temperature source Oral, resp. rate 16, height '5\' 8"'$  (1.727 m), weight 81.1 kg, SpO2 96 %. Medical Problem List and Plan: 1.  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 Making progress toward goals PT requests tension PRAFO for LLE--will order 2.  Antithrombotics: -DVT/anticoagulation:  Pharmaceutical: Other (comment) Eliquis             -antiplatelet therapy:  N/A 3. Pain Management: Oxycodone  prn  -pt prefers not to use oxydodone  - scheduled tramadol has been effective so far  -encouraged used of kpad -scheduled robaxin qid, ice Controlled with meds on 8/15-16  -prefers not to treat dysesthesias right foot. ?partial peroneal nerve injury 4. Mood/sleep:  LCSW to follow for evaluation and support.  -melatonin/trazodone             -antipsychotic agents:   seroquel--reduced to '25mg'$  bid--wean to daily  -appreciate neuropsych assessment 5. Neuropsych: This patient is almost capable of making decisions on her own behalf. 6. Skin/Wound Care: Routine pressure relief measures.              -continue local wound care as needed to multiple op/injury sites -not recommending any treatments to scalp wounds ---keep dry 7. Fluids/Electrolytes/Nutrition: Monitor I/Os. 8. Left humerus Fx and Left BBFx s/p ORIF: OK to WB on left elbow. Some callus formation noted on repeat xrays  --ROM as tolerated.  9. Right acetabular /Femur Fx s/p ORIF: NWB RLE with R-THR precautions 10. Open left femur Fx s/p ORIF, left acetab fx and SPR/IPR fx's: WBAT LLE for transfers only. 11. L-open BBFA Fx s/p ORIF: NWB on left wrist 12. Acute blood loss anemia:   Hemoglobin 10.7  on 8/8--> 11.1 8/15  Continue to monitor 13. AKI: Resolved 14. PTSD/Insomnia: added prazosin as well as melatonin to help with sleep.             --monitor for now.seems to be in good spirits, has supportive family             --weaning seroquel---use at bedtime only             -neuropsych f/u appreciated 15. Urinary retention: voiding, dc'ed urecholine 16. HTN: Monitor BP tid--  Vitals:   07/03/21 1930 07/04/21 0416  BP: (!) 138/92 (!) 152/92  Pulse: 96 86  Resp: 16 16  Temp: 98.7 F (37.1 C) 98.7 F (37.1 C)  SpO2: 100% 96%   Mildly elevated again 8/15, 8/16--increased prazosin to '2mg'$  qhs with not much change.     17.  Hypokalemia  Potassium 3.7 on 8/12---continue to supplement daily     LOS: 13 days A FACE TO FACE EVALUATION WAS PERFORMED  Meredith Staggers 07/04/2021, 12:17 PM

## 2021-07-04 NOTE — Progress Notes (Signed)
Occupational Therapy TBI Note  Patient Details  Name: Melissa Harrington MRN: [id number] Date of Birth: 06/26/1971  Today's Date: 07/04/2021 OT Individual Time: 0800-0900 OT Individual Time Calculation (min): 60 min    Short Term Goals: Week 1:  OT Short Term Goal 1 (Week 1): Pt will complete BSC transfer with +2 assist OT Short Term Goal 1 - Progress (Week 1): Met OT Short Term Goal 2 (Week 1): Pt will participate in 1 OT session while OOB for ~45 minutes to increase OOB tolerance OT Short Term Goal 2 - Progress (Week 1): Met OT Short Term Goal 3 (Week 1): Pt will complete 1 grooming task with Mod A using adaptive strategies OT Short Term Goal 3 - Progress (Week 1): Met  Skilled Therapeutic Interventions/Progress Updates:     Pt received in bed with 4 out of 10 pain in R knee. Rest and repositioning provided for pain relief  ADL:  Pt completes bathing with LHSS to wash BLE and lateral leans to wash buttocks at EOB. Pt washes UB at sink and grooms with set up. Pt completes UB dressing with set up Pt completes LB dressing with MIN A to assist 50% advancing pants past hips. Reacher used with set up to thread BLE Pt completes footwear with total A to don teds and non skid socks Pt completes toilet transfer with CGA from w/c to bariatric DAC with SB. Pt requires A for CM both directions but completes hygeine with set up. Imrpved lateral leans noted this date.   Pt left at end of session in w/c with exit alarm on, call light in reach and all needs met   Therapy Documentation Precautions:  Precautions Precautions: Posterior Hip Precaution Comments: R posterior hip precautions, LUE WBing through elbow only, LLE WBAT for transfers only Restrictions Weight Bearing Restrictions: Yes RUE Weight Bearing: Weight bearing as tolerated LUE Weight Bearing: Weight bear through elbow only RLE Weight Bearing: Non weight bearing LLE Weight Bearing: Weight bearing as tolerated General:   Vital  Signs: Therapy Vitals Temp: 98.7 F (37.1 C) Temp Source: Oral Pulse Rate: 99 Resp: 18 BP: 133/85 Patient Position (if appropriate): Lying Oxygen Therapy SpO2: 97 % O2 Device: Room Air Pain:   Agitated Behavior Scale: TBI  Observation Details Observation Environment: CIR Start of observation period - Date: 07/04/21 Start of observation period - Time: 0800 End of observation period - Date: 07/04/21 End of observation period - Time: 0900 Agitated Behavior Scale (DO NOT LEAVE BLANKS) Short attention span, easy distractibility, inability to concentrate: Absent Impulsive, impatient, low tolerance for pain or frustration: Absent Uncooperative, resistant to care, demanding: Absent Violent and/or threatening violence toward people or property: Absent Explosive and/or unpredictable anger: Absent Rocking, rubbing, moaning, or other self-stimulating behavior: Absent Pulling at tubes, restraints, etc.: Absent Wandering from treatment areas: Absent Restlessness, pacing, excessive movement: Absent Repetitive behaviors, motor, and/or verbal: Absent Rapid, loud, or excessive talking: Absent Sudden changes of mood: Absent Easily initiated or excessive crying and/or laughter: Absent Self-abusiveness, physical and/or verbal: Absent Agitated behavior scale total score: 14  ADL: ADL Eating: Not assessed Grooming: Maximal assistance Where Assessed-Grooming: Edge of bed Upper Body Bathing: Moderate assistance Where Assessed-Upper Body Bathing: Edge of bed Lower Body Bathing: Dependent, Other (comment) (2 helpers) Where Assessed-Lower Body Bathing: Edge of bed, Bed level Upper Body Dressing: Moderate assistance Where Assessed-Upper Body Dressing: Edge of bed Lower Body Dressing: Dependent, Other (Comment) (2 helpers) Where Assessed-Lower Body Dressing: Bed level, Edge of bed Toileting: Not assessed,  Dependent, Other (Comment) (2 helpers) Where Assessed-Toileting: Bed level Toilet  Transfer: Not assessed Tub/Shower Transfer: Not assessed Vision   Perception    Praxis   Exercises:   Other Treatments:     Therapy/Group: Individual Therapy  Tonny Branch 07/04/2021, 4:10 PM

## 2021-07-05 ENCOUNTER — Inpatient Hospital Stay (HOSPITAL_COMMUNITY): Payer: 59

## 2021-07-05 MED ORDER — METOPROLOL TARTRATE 25 MG PO TABS
25.0000 mg | ORAL_TABLET | Freq: Two times a day (BID) | ORAL | Status: DC
  Administered 2021-07-05 – 2021-07-09 (×8): 25 mg via ORAL
  Filled 2021-07-05 (×8): qty 1

## 2021-07-05 MED ORDER — TRAZODONE HCL 50 MG PO TABS
50.0000 mg | ORAL_TABLET | Freq: Every day | ORAL | Status: DC
  Administered 2021-07-05 – 2021-07-11 (×7): 50 mg via ORAL
  Filled 2021-07-05 (×7): qty 1
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
## 2021-07-05 NOTE — Progress Notes (Signed)
Occupational Therapy TBI Note  Patient Details  Name: Melissa Harrington MRN: [id number] Date of Birth: 1970-12-31  Today's Date: 07/05/2021 OT Individual Time: 0800-0900 OT Individual Time Calculation (min): 60 min    Short Term Goals: Week 1:  OT Short Term Goal 1 (Week 1): Pt will complete BSC transfer with +2 assist OT Short Term Goal 1 - Progress (Week 1): Met OT Short Term Goal 2 (Week 1): Pt will participate in 1 OT session while OOB for ~45 minutes to increase OOB tolerance OT Short Term Goal 2 - Progress (Week 1): Met OT Short Term Goal 3 (Week 1): Pt will complete 1 grooming task with Mod A using adaptive strategies OT Short Term Goal 3 - Progress (Week 1): Met Week 2:  OT Short Term Goal 1 (Week 2): Pt will don LB clothing using LRAD with mod A OT Short Term Goal 2 (Week 2): Pt will transfer to [facility] with CGA OT Short Term Goal 3 (Week 2): Pt will complete toileting tasks with mod A  Skilled Therapeutic Interventions/Progress Updates:    Pt received in bed with 4 out of 10 pain in Rknee. Gentle movmeent and repositioning provided for pain relief  ADL:  Pt completes bathing with set p for UB bathing Pt completes UB dressing with set up Pt completes LB dressing with MOD A for advancing pants past hips at bed level. Pt uses reacher to thread BLE and reacher loses grip and smacks pt on bridge of nose. Pt laughing and reports being "ok" Pt completes footwear with total A for teds and non skid socks Pt completes set up transfer for SB use to w/c with pt able to place board! VC for checking placement  Therapeutic activity Seated in w/c OT assessed eye alignment with penlight and R eye demo exotropia and slightly upward. Pt uses brock strink to work on Astronomer. Pt demo extra eye shifts with saccades/moving targets. 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  Pt  left at end of session in bed with exit alarm on, call light in reach and all needs met   Therapy Documentation Precautions:  Precautions Precautions: Posterior Hip Precaution Comments: R posterior hip precautions, LUE WBing through elbow only, LLE WBAT for transfers only Restrictions Weight Bearing Restrictions: Yes RUE Weight Bearing: Weight bearing as tolerated LUE Weight Bearing: Weight bear through elbow only RLE Weight Bearing: Non weight bearing LLE Weight Bearing: Weight bearing as tolerated General:   Vital Signs: Therapy Vitals Temp: 98 F (36.7 C) Pulse Rate: 92 Resp: 18 BP: 138/88 Patient Position (if appropriate): Lying Oxygen Therapy SpO2: 98 % O2 Device: Room Air Pain:   Agitated Behavior Scale: TBI  Observation Details Observation Environment: pt room Start of observation period - Date: 07/05/21 Start of observation period - Time: 0800 End of observation period - Date: 07/05/21 End of observation period - Time: 0900 Agitated Behavior Scale (DO NOT LEAVE BLANKS) Short attention span, easy distractibility, inability to concentrate: Absent Impulsive, impatient, low tolerance for pain or frustration: Absent Uncooperative, resistant to care, demanding: Absent Violent and/or threatening violence toward people or property: Absent Explosive and/or unpredictable anger: Absent Rocking, rubbing, moaning, or other self-stimulating behavior: Absent Pulling at tubes, restraints, etc.: Absent Wandering from treatment areas: Absent Restlessness, pacing, excessive movement: Absent Repetitive behaviors, motor, and/or verbal: Absent Rapid, loud, or excessive talking: Absent Sudden changes of mood: Absent Easily initiated  or excessive crying and/or laughter: Absent Self-abusiveness, physical and/or verbal: Absent Agitated behavior scale total score: 14  ADL: ADL Eating: Not assessed Grooming: Maximal assistance Where Assessed-Grooming: Edge of bed Upper Body  Bathing: Moderate assistance Where Assessed-Upper Body Bathing: Edge of bed Lower Body Bathing: Dependent, Other (comment) (2 helpers) Where Assessed-Lower Body Bathing: Edge of bed, Bed level Upper Body Dressing: Moderate assistance Where Assessed-Upper Body Dressing: Edge of bed Lower Body Dressing: Dependent, Other (Comment) (2 helpers) Where Assessed-Lower Body Dressing: Bed level, Edge of bed Toileting: Not assessed, Dependent, Other (Comment) (2 helpers) Where Assessed-Toileting: Bed level Toilet Transfer: Not assessed Tub/Shower Transfer: Not assessed Vision   Perception    Praxis   Exercises:   Other Treatments:     Therapy/Group: Individual Therapy  Tonny Branch 07/05/2021, 4:23 PM

## 2021-07-05 NOTE — Progress Notes (Signed)
Occupational Therapy TBI Note  Patient Details  Name: Melissa Harrington MRN: [id number] Date of Birth: 12/04/1970  Today's Date: 07/05/2021 OT Individual Time: [id number] OT Individual Time Calculation (min): 27 min    Short Term Goals: Week 2:  OT Short Term Goal 1 (Week 2): Pt will don LB clothing using LRAD with mod A OT Short Term Goal 2 (Week 2): Pt will transfer to [facility] with CGA OT Short Term Goal 3 (Week 2): Pt will complete toileting tasks with mod A  Skilled Therapeutic Interventions/Progress Updates:    Pt in wheelchair to start session with her daughter present.  She was able to state the month, year, and day of the week.  She was also able to state 1/3 THR precautions as well without cueing with min subtle cueing.  She agreed to work on toilet transfers to drop arm commode with use of the sliding board.  Pt reports that she will likely not be able to get the wider drop arm that is in her room and asks questions about the size of a standard drop arm.  Therapist gives her simulation of size, but does not have one available to show her currently.  Had her completed sliding board transfer to the drop arm with min assist to the right after placement of the board.  She then completes transfer to the left at min assist as well with cueing for slight forward lean to help the LLE assist while maintaining hip precautions.  Therapist has to assist with holding the board during transfer secondary to it wanting to slide because of the slick surfaces.  Discussed possible options of using dycem in the future to see if this will help with keeping the board from moving.  Also discussed setup of bathroom at home with toilet and walk-in shower.  Educated pt and daughter on need for a tub bench and provided picture reference to order if they chose as she will not be able to step over the edge of the walk-in that is being put in currently.  Finished session with pt wanting to stay up in the wheelchair with her  daughter present.  Safety belt not noted in the room or alarm pad.  Discussed with NT that she did not have an alarm currently and that daughter is supervising.    Therapy Documentation Precautions:  Precautions Precautions: Posterior Hip Precaution Booklet Issued: No Precaution Comments: R posterior hip precautions, LUE WBing through elbow only, LLE WBAT for transfers only Restrictions Weight Bearing Restrictions: Yes RUE Weight Bearing: Weight bearing as tolerated LUE Weight Bearing: Weight bearing as tolerated RLE Weight Bearing: Non weight bearing LLE Weight Bearing: Weight bearing as tolerated  Pain: Pain Assessment Pain Scale: Faces Faces Pain Scale: Hurts a little bit Pain Type: Surgical pain Pain Location: Hip Pain Descriptors / Indicators: Discomfort Pain Onset: With Activity Pain Intervention(s): Repositioned;Emotional support Agitated Behavior Scale: TBI Observation Details Observation Environment: CIR Start of observation period - Date: 07/05/21 Start of observation period - Time: 1530 End of observation period - Date: 07/05/21 End of observation period - Time: 1530 Agitated Behavior Scale (DO NOT LEAVE BLANKS) Short attention span, easy distractibility, inability to concentrate: Absent Impulsive, impatient, low tolerance for pain or frustration: Absent Uncooperative, resistant to care, demanding: Absent Violent and/or threatening violence toward people or property: Absent Explosive and/or unpredictable anger: Absent Rocking, rubbing, moaning, or other self-stimulating behavior: Absent Pulling at tubes, restraints, etc.: Absent Wandering from treatment areas: Absent Restlessness, pacing, excessive movement: Absent  Repetitive behaviors, motor, and/or verbal: Absent Rapid, loud, or excessive talking: Absent Sudden changes of mood: Absent Easily initiated or excessive crying and/or laughter: Absent Self-abusiveness, physical and/or verbal: Absent Agitated  behavior scale total score: 14  ADL: See Care Tool Section for some details of mobility and selfcare   Therapy/Group: Individual Therapy  Kristin Barcus OTR/L 07/05/2021, 5:24 PM

## 2021-07-05 NOTE — Progress Notes (Addendum)
PROGRESS NOTE   Subjective/Complaints: A little sore from therapy yesterday. Bumped her head with reacher when it slipped from pants leg. Got a laugh out of it  ROS: Patient denies fever, rash, sore throat, blurred vision, nausea, vomiting, diarrhea, cough, shortness of breath or chest pain,  headache, or mood change.   Objective:   No results found. No results for input(s): WBC, HGB, HCT, PLT in the last 72 hours.   No results for input(s): NA, K, CL, CO2, GLUCOSE, BUN, CREATININE, CALCIUM in the last 72 hours.  Intake/Output Summary (Last 24 hours) at 07/05/2021 1020 Last data filed at 07/05/2021 0837 Gross per 24 hour  Intake 880 ml  Output --  Net 880 ml     Pressure Injury 06/10/21 Head Lower;Posterior Stage 1 -  Intact skin with non-blanchable redness of a localized area usually over a bony prominence. posterior head; difficult to assess d/t matted hair; approximately 3cm x 3cm? (Active)  06/10/21 0830  Location: Head  Location Orientation: Lower;Posterior  Staging: Stage 1 -  Intact skin with non-blanchable redness of a localized area usually over a bony prominence.  Wound Description (Comments): posterior head; difficult to assess d/t matted hair; approximately 3cm x 3cm?  Present on Admission: No    Physical Exam: Vital Signs Blood pressure (!) 150/86, pulse 96, temperature 98.3 F (36.8 C), temperature source Oral, resp. rate 18, height '5\' 8"'$  (1.727 m), weight 81.1 kg, SpO2 98 %. Constitutional: No distress . Vital signs reviewed. HEENT: NCAT, EOMI, oral membranes moist Neck: supple Cardiovascular: RRR without murmur. No JVD    Respiratory/Chest: CTA Bilaterally without wheezes or rales. Normal effort    GI/Abdomen: BS +, non-tender, non-distended Ext: no clubbing, cyanosis, or edema Psych: pleasant and cooperative  Skin: Warm and dry.  Scattered lacerations, wounds head to toe. All sutures out. Areas on  scalp are dry/eschar along crown and occiput--continue to improve Psych: Normal mood.  Normal behavior. Musc: No edema in extremities.  Left heel cord a little tight. Neuro: Alert Motor: 5/5 in RUE,  LUE: Shoulder abduction 2+/5, distally limited by splint, unchanged, wrapped appropriately RLE 3- Hip and knee ext 4/5 ADF/PF, ?mild weakness right ADF/EHL, sensory loss on top of foot to medial ankle. LLE 3+/5 hip and knee ext 4/5 ADF/PF--stable  Assessment/Plan: 1. Functional deficits which require 3+ hours per day of interdisciplinary therapy in a comprehensive inpatient rehab setting. Physiatrist is providing close team supervision and 24 hour management of active medical problems listed below. Physiatrist and rehab team continue to assess barriers to discharge/monitor patient progress toward functional and medical goals  Care Tool:  Bathing    Body parts bathed by patient: Left arm, Chest, Abdomen, Right upper leg, Left upper leg, Face, Front perineal area   Body parts bathed by helper: Right arm, Buttocks, Right lower leg, Left lower leg     Bathing assist Assist Level: Moderate Assistance - Patient 50 - 74%     Upper Body Dressing/Undressing Upper body dressing   What is the patient wearing?: Pull over shirt    Upper body assist Assist Level: Supervision/Verbal cueing    Lower Body Dressing/Undressing Lower body dressing  What is the patient wearing?: Incontinence brief     Lower body assist Assist for lower body dressing: Maximal Assistance - Patient 25 - 49%     Toileting Toileting    Toileting assist Assist for toileting: Maximal Assistance - Patient 25 - 49%     Transfers Chair/bed transfer  Transfers assist  Chair/bed transfer activity did not occur: Safety/medical concerns  Chair/bed transfer assist level: Supervision/Verbal cueing Chair/bed transfer assistive device: Sliding board, Armrests   Locomotion Ambulation   Ambulation assist    Ambulation activity did not occur: Safety/medical concerns          Walk 10 feet activity   Assist  Walk 10 feet activity did not occur: Safety/medical concerns        Walk 50 feet activity   Assist Walk 50 feet with 2 turns activity did not occur: Safety/medical concerns         Walk 150 feet activity   Assist Walk 150 feet activity did not occur: Safety/medical concerns         Walk 10 feet on uneven surface  activity   Assist Walk 10 feet on uneven surfaces activity did not occur: Safety/medical concerns         Wheelchair     Assist Will patient use wheelchair at discharge?: Yes Type of Wheelchair: Manual Wheelchair activity did not occur: Safety/medical concerns  Wheelchair assist level: Supervision/Verbal cueing Max wheelchair distance: 25 ft    Wheelchair 50 feet with 2 turns activity    Assist    Wheelchair 50 feet with 2 turns activity did not occur: Safety/medical concerns       Wheelchair 150 feet activity     Assist  Wheelchair 150 feet activity did not occur: Safety/medical concerns       Blood pressure (!) 150/86, pulse 96, temperature 98.3 F (36.8 C), temperature source Oral, resp. rate 18, height '5\' 8"'$  (1.727 m), weight 81.1 kg, SpO2 98 %. Medical Problem List and Plan: 1.  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 Making progress toward goals Night time tension PRAFO for LLE has been ordered 2.  Antithrombotics: -DVT/anticoagulation:  Pharmaceutical: Other (comment) Eliquis             -antiplatelet therapy:  N/A 3. Pain Management: Oxycodone  prn  -pt prefers not to use oxydodone  - scheduled tramadol has been effective so far  -encouraged used of kpad -scheduled robaxin qid, ice Controlled with meds on 8/19  -prefers not to treat dysesthesias right foot. ?partial peroneal nerve injury at knee 4. Mood/sleep:  LCSW to follow for evaluation and  support.  -melatonin/trazodone             -antipsychotic agents:  dc seroquel, use trazodone '50mg'$  qhs for sleep instead  -appreciate neuropsych assessment 5. Neuropsych: This patient is almost capable of making decisions on her own behalf. 6. Skin/Wound Care: Routine pressure relief measures.              -continue local wound care as needed to multiple op/injury sites -not recommending any treatments to scalp wounds ---keep dry only 7. Fluids/Electrolytes/Nutrition: Monitor I/Os. 8. Left humerus Fx and Left BBFx s/p ORIF: OK to WB on left elbow. Some callus formation noted on repeat xrays  --ROM as tolerated.  9. Right acetabular /Femur Fx s/p ORIF: NWB RLE with R-THR precautions 10. Open left femur Fx s/p ORIF, left acetab fx and SPR/IPR fx's: WBAT LLE for transfers  only. 11. L-open BBFA Fx s/p ORIF: NWB on left wrist 12. Acute blood loss anemia:   Hemoglobin 10.7 on 8/8--> 11.1 8/15  Continue to monitor 13. AKI: Resolved 14. PTSD/Insomnia: added prazosin as well as melatonin to help with sleep.             --monitor for now.seems to be in good spirits, has supportive family             --weaning seroquel---use at bedtime only             -neuropsych f/u appreciated 15. Urinary retention: voiding, dc'ed urecholine 16. HTN: Monitor BP tid--  Vitals:   07/04/21 1956 07/05/21 0504  BP: (!) 145/91 (!) 150/86  Pulse: (!) 107 96  Resp: 20 18  Temp: 98.1 F (36.7 C) 98.3 F (36.8 C)  SpO2: 98% 98%   Mildly elevated again 8/15-8/19--increased prazosin to '2mg'$  qhs with some improvement   8/19-increase metoprolol to '25mg'$  bid     17.  Hypokalemia  Potassium 3.7 on 8/12---continue to supplement daily     LOS: 14 days A FACE TO FACE EVALUATION WAS PERFORMED  Meredith Staggers 07/05/2021, 10:20 AM

## 2021-07-05 NOTE — Progress Notes (Signed)
Physical Therapy Session Note  Patient Details  Name: Melissa Harrington MRN: [id number] Date of Birth: 10/01/1971  Today's Date: 07/05/2021 PT Individual Time: 0900-1000 and 1500-1530 PT Individual Time Calculation (min): 60 min and 30 min.  Short Term Goals: Week 2:  PT Short Term Goal 1 (Week 2): Pt will perform least restrictive transfer with CGA consistently PT Short Term Goal 2 (Week 2): Pt will be able to perform sit to stand with LRAD and assist x 1 PT Short Term Goal 3 (Week 2): Pt will tolerate sitting OOB x 1 hour  Skilled Therapeutic Interventions/Progress Updates:   First session:  Pt presents sitting in w/c and agreeable to therapy, handed off from OT.  Pt wheeled to main gym for time conservation.  Pt required mod A for placement of SB and lifting RLE, as pt does assist to place SB.  Pt transferred to mat table to R w/ supervision , maintaining RLE off ground, steadying of w/c.  Pt performed calf/toe raises 3 x 15, LAQ, and abd/add at EOM 3 x 10, w/ cueing for increased knee flexion.  Pt transferred sit to supine w/ set-up (handing yoga blocks) and increased time to bring LEs onto mat table.  Pt remained in supine w/ rolled towel under B knees, gradually unrolling to improve knee ext.  Pt states pain in R LB and knee, but improved w/ time.  Pt performed QS 3 x 10, Left HS w/ AAROM and then decreasing assist.  Pt tolerated LUE on rolled towel to increase elbow extension.  Ortho in during rx to place [facility] boot, but removed for continuation of therapy.  Pt transferred sup to sit w/ CGA after min A to roll to L for transfer 2/2 lack of side rail.  Pt required assist to place yoga blocks for LUE participation.  Pt transferred w/ SB to L w/ supervision after Total A placing SB 2/2 unable to use LUE.  Pt transferred w/ supervision, cues for use of LLE.  Pt returned to room and remained sitting in w/c w/ all needs in reach.  Second session:Pt presents supine in bed and agreeable to therapy.  Pt  bed flattened to simulate bed at home.  Pt requires manual A to flex R knee to semi-hooklying position to assist w/ roll to Left.  Pt education given for UE and LE rolling at same time w/ increased "throwing"of RUE to initiate and then min A to achieve L sidelying.  Pt then requires supervision/set-up w/ yoga blocks to transfer to sitting.  Pt requires min A to flex R hip and pt able to place SB .  Pt transfers bed > w/c w/ supervision, steadying of w/c.  Pt requires verbal cues for forward lean and WB through LLE to improve clearance.  Pt performed w/c > bed w/ supervision, w/ total A for SB placement, but bed left on a upward bias to elicit improved clearance to transfer.  Pt returned to w/c w/ supervision.  Pt able to remove SB w/ CGA and cueing to lift LLE.  Pt remained in w/c and wheeled to gym for next treatment w/ OT.  Dtr present during session and ed continued for safe return to home.     Therapy Documentation Precautions:  Precautions Precautions: Posterior Hip Precaution Comments: R posterior hip precautions, LUE WBing through elbow only, LLE WBAT for transfers only Restrictions Weight Bearing Restrictions: Yes RUE Weight Bearing: Weight bearing as tolerated LUE Weight Bearing: Weight bear through elbow only RLE Weight  Bearing: Non weight bearing LLE Weight Bearing: Weight bearing as tolerated General:   Vital Signs:  Pain:pt states achiness grossly whole body at initiation w/ pain meds given, but increased to 4/10 w/ activity. Pain Assessment Pain Scale: 0-10 Pain Score: 3  Mobility:   :      Therapy/Group: Individual Therapy  Ladoris Gene 07/05/2021, 10:57 AM

## 2021-07-06 NOTE — Progress Notes (Signed)
Occupational Therapy TBI Note  Patient Details  Name: Melissa Harrington MRN: [id number] Date of Birth: October 05, 1971   Today's Date: 07/06/2021 OT Individual Time: 1000-1115 OT Individual Time Calculation (min): 75 min   Short Term Goals: Week 2:  OT Short Term Goal 1 (Week 2): Pt will don LB clothing using LRAD with mod A OT Short Term Goal 2 (Week 2): Pt will transfer to [facility] with CGA OT Short Term Goal 3 (Week 2): Pt will complete toileting tasks with mod A  Skilled Therapeutic Interventions/Progress Updates:     Pt received in bed with 5 out of 10 pain in R knee. Repositioning and shower (heat) provided for pain relief  ADL:  Pt completes bathing with supervision using lateral leans and LHSS to wash BLE, back, and RUE. L arm occluded to keep water off cast Pt completes UB dressing with set up Pt completes LB dressing with A for advancing pants past hips using lateral leans onto TTB and stool with yoga blocks on it from w/c. Pt states, "lord I am just wearing dresses at home. Pt completes footwear with total A for time management Pt completes toileting with supervision with no brief/pants on d/t only wearing Tshirt at night Pt completes toileting transfer with MIN A for lateral scoot across SB with pillow case d/t no LB clothing on Pt completes shower/Tub transfer with MIN A to TTB and MOD A for 1 posterior LOB sliding to w/c with SB and pillow case. VC for anterior weight shift throughout Pt completes grooming and hair drying at sink with se tup  Pt left at end of session in w/c with exit alarm on, call light in reach and all needs met   Therapy Documentation Precautions:  Precautions Precautions: Posterior Hip Precaution Booklet Issued: No Precaution Comments: R posterior hip precautions, LUE WBing through elbow only, LLE WBAT for transfers only Restrictions Weight Bearing Restrictions: Yes RUE Weight Bearing: Weight bearing as tolerated LUE Weight Bearing: Weight bearing as  tolerated RLE Weight Bearing: Non weight bearing LLE Weight Bearing: Weight bearing as tolerated General:   Vital Signs: Therapy Vitals Pulse Rate: 88 BP: (!) 152/90 Pain:   Agitated Behavior Scale: TBI  Observation Details Observation Environment: pt room Start of observation period - Date: 07/06/21 Start of observation period - Time: 1000 End of observation period - Date: 07/06/21 End of observation period - Time: 1115 Agitated Behavior Scale (DO NOT LEAVE BLANKS) Short attention span, easy distractibility, inability to concentrate: Absent Impulsive, impatient, low tolerance for pain or frustration: Absent Uncooperative, resistant to care, demanding: Absent Violent and/or threatening violence toward people or property: Absent Explosive and/or unpredictable anger: Absent Rocking, rubbing, moaning, or other self-stimulating behavior: Absent Pulling at tubes, restraints, etc.: Absent Wandering from treatment areas: Absent Restlessness, pacing, excessive movement: Absent Repetitive behaviors, motor, and/or verbal: Absent Rapid, loud, or excessive talking: Absent Sudden changes of mood: Absent Easily initiated or excessive crying and/or laughter: Absent Self-abusiveness, physical and/or verbal: Absent Agitated behavior scale total score: 14  ADL: ADL Eating: Not assessed Grooming: Maximal assistance Where Assessed-Grooming: Edge of bed Upper Body Bathing: Moderate assistance Where Assessed-Upper Body Bathing: Edge of bed Lower Body Bathing: Dependent, Other (comment) (2 helpers) Where Assessed-Lower Body Bathing: Edge of bed, Bed level Upper Body Dressing: Moderate assistance Where Assessed-Upper Body Dressing: Edge of bed Lower Body Dressing: Dependent, Other (Comment) (2 helpers) Where Assessed-Lower Body Dressing: Bed level, Edge of bed Toileting: Not assessed, Dependent, Other (Comment) (2 helpers) Where Assessed-Toileting: Bed level  Toilet Transfer: Not  assessed Tub/Shower Transfer: Not assessed Vision   Perception    Praxis   Exercises:   Other Treatments:     Therapy/Group: Individual Therapy  Tonny Branch 07/06/2021, 12:11 PM

## 2021-07-06 NOTE — Progress Notes (Signed)
Orthopedic Tech Progress Note Patient Details:  Melissa Harrington 18-Jul-1971 [id number]  Ortho Devices Type of Ortho Device: Velcro wrist forearm splint Ortho Device/Splint Location: LUE Ortho Device/Splint Interventions: Application, Adjustment, Ordered   Post Interventions Patient Tolerated: Well Instructions Provided: Adjustment of device  Melissa Harrington 07/06/2021, 3:52 PM
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
## 2021-07-07 NOTE — Progress Notes (Signed)
Occupational Therapy Session Note  Patient Details  Name: Melissa Harrington MRN: [id number] Date of Birth: [birth year]  Today's Date: 07/08/2021 OT Individual Time: 1001-1057 and 1330-1413 OT Individual Time Calculation (min): 56 min and 43 min  Skilled Therapeutic Interventions/Progress Updates:    Pt greeted in the w/c with pain manageable for tx without additional interventions. Her ADL needs were met, just finished PT. Pt requested to go outdoors during session. She was escorted to an outdoor setting to address psychosocial wellbeing. OT guided her through UB stretches targeting cervical/thoracic/lumbar spine and shoulders while adhering to her Rt hip precautions. All stretches completed in pain-free range. Education emphasis was placed on diaphragmatic breathing and body awareness in regards to repetitions/amplitude of stretch. Discussed incorporating stretching into her daily routine at home to prevent UB injury/strain during her healing process. Per pt, primary OT has given her a yoga-based stretching handout already. She was then returned to the room via w/c and completed slideboard<bed with Min A using yoga block for the Lt UE, pt able to position yoga appropriately block throughout transfer. She remained in bed at close of session, all needs within reach and bed alarm set.    2nd Session 1:1 tx (43 min) Pt greeted in bed, agreeable to earlier than scheduled tx and requesting to practice slideboard transfers to drop arm BSC and also BSC over toilet. Note that pt needed to urgently void bladder upon arrival, CGA-Min A for slideboard<BSC and Mod A to lower clothing on the Lt side using lateral lean technique. Pt able to complete hygiene herself and then required Mod A to elevate pants again. OT recommended to pt wearing dresses at home to increase ease of toileting. Pt in agreement. She then transferred to the w/c and we discussed her bathroom layout at home. We positioned the w/c in the manner she  will need to at home, also placed drop arm BSC over toilet. CGA-Min A for this transfer as well, worked on pt directing her care including setup of w/c and where caregivers should stand/assist. Teach back utilized for carryover. Pt reiterated that she will have 24/7 supervision assist split between friends + family. She remained sitting up in the w/c at close of session, all needs within reach. Tx focus placed on d/c planning + functional transfers. No c/o pain today.  Therapy Documentation Precautions:  Precautions Precautions: Posterior Hip Precaution Booklet Issued: No Precaution Comments: R posterior hip precautions, LUE WBing through elbow only, LLE WBAT for transfers only Restrictions Weight Bearing Restrictions: Yes RUE Weight Bearing: Weight bearing as tolerated LUE Weight Bearing: Weight bearing as tolerated RLE Weight Bearing: Non weight bearing LLE Weight Bearing: Weight bearing as tolerated   ADL: ADL Eating: Not assessed Grooming: Maximal assistance Where Assessed-Grooming: Edge of bed Upper Body Bathing: Moderate assistance Where Assessed-Upper Body Bathing: Edge of bed Lower Body Bathing: Dependent, Other (comment) (2 helpers) Where Assessed-Lower Body Bathing: Edge of bed, Bed level Upper Body Dressing: Moderate assistance Where Assessed-Upper Body Dressing: Edge of bed Lower Body Dressing: Dependent, Other (Comment) (2 helpers) Where Assessed-Lower Body Dressing: Bed level, Edge of bed Toileting: Not assessed, Dependent, Other (Comment) (2 helpers) Where Assessed-Toileting: Bed level Toilet Transfer: Not assessed Tub/Shower Transfer: Not assessed      Therapy/Group: Individual Therapy  Ashly Goethe A Kaedance Magos 07/08/2021, 12:12 PM

## 2021-07-07 NOTE — Plan of Care (Signed)
  Problem: Consults Goal: RH GENERAL PATIENT EDUCATION Description: See Patient Education module for education specifics. Outcome: Progressing Goal: Skin Care Protocol Initiated - if Braden Score 18 or less Description: If consults are not indicated, leave blank or document N/A Outcome: Progressing Goal: Nutrition Consult-if indicated Outcome: Progressing   Problem: RH BOWEL ELIMINATION Goal: RH STG MANAGE BOWEL WITH ASSISTANCE Description: STG Manage Bowel with Assistance. min Outcome: Progressing   Problem: RH PAIN MANAGEMENT Goal: RH STG PAIN MANAGED AT OR BELOW PT'S PAIN GOAL Description: Less than 4 Outcome: Progressing   Problem: RH BLADDER ELIMINATION Goal: RH STG MANAGE BLADDER WITH MEDICATION WITH ASSISTANCE Description: STG Manage Bladder With Medication With Mod I Assistance. Outcome: Progressing   Problem: RH SKIN INTEGRITY Goal: RH STG MAINTAIN SKIN INTEGRITY WITH ASSISTANCE Description: STG Maintain Skin Integrity With Mod I Assistance. Outcome: Progressing Goal: RH STG ABLE TO PERFORM INCISION/WOUND CARE W/ASSISTANCE Description: STG Able To Perform Incision/Wound Care With Mod I Assistance. Outcome: Progressing   Problem: RH SAFETY Goal: RH STG DECREASED RISK OF FALL WITH ASSISTANCE Description: STG Decreased Risk of Fall With Mod I Assistance. Outcome: Progressing

## 2021-07-07 NOTE — Progress Notes (Signed)
All sutures removed from patient's left arm. No separation along the suture line noted. Patient tolerated well.
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
## 2021-07-08 NOTE — Progress Notes (Signed)
Physical Therapy Session Note  Patient Details  Name: Melissa Harrington MRN: [id number] Date of Birth: 11/24/1970  Today's Date: 07/08/2021 PT Individual Time: 0903-1000 PT Individual Time Calculation (min): 57 min   Short Term Goals: Week 1:  PT Short Term Goal 1 (Week 1): Pt will complete supine to sit transfer to EOB with Mod Assist PT Short Term Goal 1 - Progress (Week 1): Met PT Short Term Goal 2 (Week 1): Pt will complete a sit to stand transfer with Mod Assist in setting of weight bearing precautions PT Short Term Goal 2 - Progress (Week 1): Not met PT Short Term Goal 3 (Week 1): Pt will tolerate 30 mins of therapy without fatigue PT Short Term Goal 3 - Progress (Week 1): Met Week 2:  PT Short Term Goal 1 (Week 2): Pt will perform least restrictive transfer with CGA consistently PT Short Term Goal 2 (Week 2): Pt will be able to perform sit to stand with LRAD and assist x 1 PT Short Term Goal 3 (Week 2): Pt will tolerate sitting OOB x 1 hour  Skilled Therapeutic Interventions/Progress Updates:  Pt received sitting EOB w/NT in room, denied pain but reported significant hip flexor and quad tightness 2/2 no therapy all weekend. Emphasis of session on stretching and increased comfort. Pt performed lateral board transfer to R side w/set-up assist and supervision w/use of yoga blocks. Pt able to place board herself to transfer to R side. Pt transported to day gym w/total A for time management and performed lateral board transfer to L side w/set-up assist and stabilization of WC, therapist placed board. At [facility], performed 20 marches on LLE and 2x10 LAQ on BLEs to promote hamstring flexibility and quad strength. Pt scooted posteriorly on mat w/supervision, using yoga block for LUE. Sit <>supine w/min A for trunk control and pt laid supine for 10 minutes w/towel rolls under B knees. Towels reduced every 2 minutes as pt able to tolerate more stretch in hamstrings and hip flexors until lying flat on mat.  Supine <>sit w/min A for trunk stabilization and scoot to EOM w/supervision and yoga block under LUE. Pt performed lateral board transfer to R w/set-up assist and stabilization of WC. Pt transported back to room w/total A and was left sitting in [facility] as handoff to OT w/all needs in reach. Pt denied symptoms of orthostasis throughout session.   Therapy Documentation Precautions:  Precautions Precautions: Posterior Hip Precaution Booklet Issued: No Precaution Comments: R posterior hip precautions, LUE WBing through elbow only, LLE WBAT for transfers only Restrictions Weight Bearing Restrictions: Yes RUE Weight Bearing: Weight bearing as tolerated LUE Weight Bearing: Weight bearing as tolerated RLE Weight Bearing: Non weight bearing LLE Weight Bearing: Weight bearing as tolerated   Therapy/Group: Individual Therapy Cruzita Lederer Suheyb Raucci, PT, DPT  07/08/2021, 7:44 AM

## 2021-07-08 NOTE — Progress Notes (Signed)
Occupational Therapy Session Note  Patient Details  Name: Melissa Harrington MRN: [id number] Date of Birth: 1971/08/05  Today's Date: 07/08/2021 OT Individual Time: [id number] OT Individual Time Calculation (min): 43 min    Short Term Goals: Week 2:  OT Short Term Goal 1 (Week 2): Pt will don LB clothing using LRAD with mod A OT Short Term Goal 2 (Week 2): Pt will transfer to [facility] with CGA OT Short Term Goal 3 (Week 2): Pt will complete toileting tasks with mod A  Skilled Therapeutic Interventions/Progress Updates:    Patient seated in w/c, alert and ready for therapy session.  She notes mild fatigue and soreness but indicates that pain is under control.  Completed visual motor exercises - smooth pursuits with marsden ball OS/OD/OU (smooth movement in all directions, needed to move ball to distance of 6 feet away with OU due to convergence difficulty, c/o fatigue and mild strain), accomodation with good timing and accuracy (she notes strain and difficulty after approx 6 reps), brock string exercises with beads at approx 6', 8' and 10'  (able to see "X" pattern, fatigue with repetition)  reviewed reading strategies and exercises that she can complete on her own.  SB transfer w/c to bed with set up of w/c, board placement and CS.    Sit to supine with CS and elevated HOB.  She remained in bed at close of session, bed alarm set and call bell in hand.    Therapy Documentation Precautions:  Precautions Precautions: Posterior Hip Precaution Booklet Issued: No Precaution Comments: R posterior hip precautions, LUE WBing through elbow only, LLE WBAT for transfers only Restrictions Weight Bearing Restrictions: Yes RUE Weight Bearing: Weight bearing as tolerated LUE Weight Bearing: Weight bearing as tolerated RLE Weight Bearing: Non weight bearing LLE Weight Bearing: Weight bearing as tolerated   Therapy/Group: Individual Therapy  Carlos Levering 07/08/2021, 7:44 AM

## 2021-07-08 NOTE — Progress Notes (Signed)
Patient ID: BARRI PIETZ, female   DOB: Apr 12, 1971, 50 y.o.   MRN: [id number]  SW received phone call from pt who informed family will be here for family edu tomorrow at 1pm with her daughters and friend Ruby. Also requested a letter for her dtr to give to college admissions office and financial aide office to support the reason she is taking the semester off. SW requested letter from physician.   SW provided letter to patient.   Loralee Pacas, MSW, [place] Office: [phone] Cell: [phone] Fax: [phone]

## 2021-07-09 MED ORDER — METOPROLOL TARTRATE 50 MG PO TABS
50.0000 mg | ORAL_TABLET | Freq: Two times a day (BID) | ORAL | Status: DC
  Administered 2021-07-09 – 2021-07-12 (×6): 50 mg via ORAL
  Filled 2021-07-09 (×6): qty 1

## 2021-07-09 NOTE — Patient Care Conference (Signed)
Inpatient RehabilitationTeam Conference and Plan of Care Update Date: 07/09/2021   Time: 10:08 AM    Patient Name: Melissa Harrington      Medical Record Number: [id number]  Date of Birth: 1971-11-09 Sex: Female         Room/Bed: 4W11C/4W11C-01 Payor Info: Payor: Theme park manager / Plan: [place] / Product Type: *No Product type* /    Admit Date/Time:  06/21/2021  6:26 PM  Primary Diagnosis:  TBI (traumatic brain injury) [facility])  Hospital Problems: Principal Problem:   TBI (traumatic brain injury) ([place]) Active Problems:   Critical polytrauma   Hypokalemia   Elevated blood pressure reading   PTSD (post-traumatic stress disorder)   Postoperative pain   Acute blood loss anemia    Expected Discharge Date: Expected Discharge Date: 07/12/21  Team Members Present: Physician leading conference: Dr. Alger Simons Social Worker Present: Loralee Pacas, [place] Nurse Present: Dorthula Nettles, RN PT Present: Apolinar Junes, PT OT Present: Willeen Cass, OT PPS Coordinator present : Ileana Ladd, PT     Current Status/Progress Goal Weekly Team Focus  Bowel/Bladder   Pt cont of B/B. last BM-8/22  Regular BMs every 3 days or less  Assess B? q shift and PRN   Swallow/Nutrition/ Hydration             ADL's             Mobility   Min A for bed mobility, supervision for slide boad transfers, +2 sit to stand with poor compliance to R NWB (have limited standing due to this with focus on lower extremity strengthening), supervision w/c propulsion with RUE, LLE  Removed standing goals due to limited progress, upgraded bed mobiltiy and transfer goals to supervision, added w/c goal  Functional mobility, activity tolerance, ROM/strengthening, d/c planning, patient/caregiver education   Communication             Safety/Cognition/ Behavioral Observations            Pain   Pt reports pain 2 of 10, controlled by Tylenol, Tamadol and Robaxin  Continued pain rating of <3/10   Assess pain q shift and PRN   Skin   Healing St1 to back of head, healing incisions and abrasions  no new breakdown or S/Sx of infection  Assess skin q shift and PRN     Discharge Planning:  Pt is a recent widow. Pt will have support from her two eldest daughters, and friend [name] that is flying in from [place] to live with them. There will be support from [employer]. FAM edu 8/23 1pm-3:30pm with friend [name] and daughters.   Team Discussion: Scalp wounds OTA, continue to work on ROM, emotionally doing well. Continent B/B, pain is controlled with scheduled medications. Min assist with slide board transfers, mod/max assist with lower body dressing. Having difficulty with fatigue. Having hard time with weight bearing precautions. Min assist for couch transfer, doing a car transfer today. Can only read a few sentences, visual impairment.  Patient on target to meet rehab goals: yes  *See Care Plan and progress notes for long and short-term goals.   Revisions to Treatment Plan:  Not at this time. Teaching Needs: Family education, medication management, pain management, skin/wound care, weight bearing precautions, transfer training, slide board training, balance training, endurance training, safety awareness.   Current Barriers to Discharge: Decreased caregiver support, Medical stability, Home enviroment access/layout, Wound care, Lack of/limited family support, Weight bearing restrictions, and Medication compliance  Possible Resolutions to Barriers: Continue current  medications, provide emotional support.     Medical Summary Current Status: pain under control. wounds healing/dry. right knee lacking flex/ext. wbat LLE. mood stable  Barriers to Discharge: Medical stability   Possible Resolutions to Barriers/Weekly Focus: finalize dc planning, pain mgt   Continued Need for Acute Rehabilitation Level of Care: The patient requires daily medical management by a physician with specialized training in physical  medicine and rehabilitation for the following reasons: Direction of a multidisciplinary physical rehabilitation program to maximize functional independence : Yes Medical management of patient stability for increased activity during participation in an intensive rehabilitation regime.: Yes Analysis of laboratory values and/or radiology reports with any subsequent need for medication adjustment and/or medical intervention. : Yes   I attest that I was present, lead the team conference, and concur with the assessment and plan of the team.   Dorthula Nettles G 07/09/2021, 1:01 PM

## 2021-07-09 NOTE — Progress Notes (Signed)
Occupational Therapy Session Note  Patient Details  Name: Melissa Harrington MRN: [id number] Date of Birth: [birth year]  Today's Date: 07/09/2021 OT Individual Time: 1430-1525 OT Individual Time Calculation (min): 55 min    Short Term Goals: Week 2:  OT Short Term Goal 1 (Week 2): Pt will don LB clothing using LRAD with mod A OT Short Term Goal 2 (Week 2): Pt will transfer to [facility] with CGA OT Short Term Goal 3 (Week 2): Pt will complete toileting tasks with mod A  Skilled Therapeutic Interventions/Progress Updates:    Patient seated in w/c, daughters and friend, Melissa Harrington, present for family education session this afternoon.  Patient and family state that they want to practice functional transfer to/from shower bench and drop arm commode.  Discussed options for placement of equipment and home set up in general to allow for ease of movement.  Family demonstrates good understanding of w/c set up and patient needs.  Patient demonstrated SB transfer to/from tub bench in tub/shower - she requires assist to set up w/c and place board otherwise CS/CGA.  Provided dycem and educated regarding use on hard/slippery surfaces.  To/from drop arm commode with w/c set up and board placement patient CGA for SB transfer.  CM on drop arm commode with max A, she is able to complete hygiene mod I.  Discussed clothing options and pros/cons for optimal movement and safety.  Patient and family expressed that they are pleased with progress and feeling more comfortable with transition plan to home later this week.  Patient remained in w/c at close of session all needs within reach.    Therapy Documentation Precautions:  Precautions Precautions: Posterior Hip Precaution Booklet Issued: No Precaution Comments: R posterior hip precautions, LUE WBing through elbow only, LLE WBAT for transfers only Restrictions Weight Bearing Restrictions: Yes RUE Weight Bearing: Weight bearing as tolerated LUE Weight Bearing: Weight bearing as  tolerated RLE Weight Bearing: Non weight bearing LLE Weight Bearing: Weight bearing as tolerated   Therapy/Group: Individual Therapy  Carlos Levering 07/09/2021, 7:49 AM

## 2021-07-09 NOTE — Progress Notes (Signed)
Patient ID: Melissa Harrington, female   DOB: 01-18-1971, 50 y.o.   MRN: [id number]  SW met with pt, pt dtrs- [name] and [name], and friend [name] to provide updates from team conference on gains made, and continued support required for patient. Pt aware there will be follow-up once there is more information.   Loralee Pacas, MSW, [place] Office: [phone] Cell: [phone] Fax: [phone]

## 2021-07-09 NOTE — Progress Notes (Signed)
Physical Therapy Weekly Progress Note  Patient Details  Name: Melissa Harrington MRN: [id number] Date of Birth: 1971-05-07  Beginning of progress report period: July 01, 2021 End of progress report period: July 09, 2021  Today's Date: 07/09/2021   Patient has met 2 of 3 short term goals.  Patient has made excellent progress this week. She is currently performing bed mobility and slide board transfers with supervision-set-up assist, w/c propulsion >50 feet with supervision using L LE and R UE, and sit-to-from stand with min A +2 with improved compliance with weight bearing precautions using L PFRW.  Patient continues to demonstrate the following deficits muscle weakness and muscle joint tightness, decreased cardiorespiratoy endurance, and decreased standing balance, decreased postural control, and decreased balance strategies and therefore will continue to benefit from skilled PT intervention to increase functional independence with mobility.  Patient progressing toward long term goals..  Continue plan of care.  PT Short Term Goals Week 2:  PT Short Term Goal 1 (Week 2): Pt will perform least restrictive transfer with CGA consistently PT Short Term Goal 1 - Progress (Week 2): Met PT Short Term Goal 2 (Week 2): Pt will be able to perform sit to stand with LRAD and assist x 1 PT Short Term Goal 2 - Progress (Week 2): Progressing toward goal PT Short Term Goal 3 (Week 2): Pt will tolerate sitting OOB x 1 hour PT Short Term Goal 3 - Progress (Week 2): Met Week 3:  PT Short Term Goal 1 (Week 3): STG=LTG due to ELOS.  Therapy Documentation Precautions:  Precautions Precautions: Posterior Hip Precaution Booklet Issued: No Precaution Comments: R posterior hip precautions, LUE WBing through elbow only, LLE WBAT for transfers only Restrictions Weight Bearing Restrictions: Yes RUE Weight Bearing: Weight bearing as tolerated LUE Weight Bearing: Weight bearing as tolerated RLE Weight Bearing:  Non weight bearing LLE Weight Bearing: Weight bearing as tolerated   Daksha Koone L Shye Doty PT, DPT  07/09/2021, 5:08 PM

## 2021-07-09 NOTE — Progress Notes (Signed)
Physical Therapy TBI Note  Patient Details  Name: Melissa Harrington MRN: [id number] Date of Birth: Sep 22, 1971  Today's Date: 07/09/2021 PT Individual Time: 1300-1415 and 1300-1415 PT Individual Time Calculation (min): 75 min and 75 min  Short Term Goals: Week 3:  PT Short Term Goal 1 (Week 3): STG=LTG due to ELOS.  Skilled Therapeutic Interventions/Progress Updates:     Session 1: Patient sitting edge of bed upon PT arrival. Patient alert and agreeable to PT session. Patient reported 5/10 lower extremity (B pelvic/hip primarily) pain during session, RN made aware. PT provided repositioning, rest breaks, and distraction as pain interventions throughout session. Patient reported increased knee and hip joint stiffness from limited therapy over the weekend.   Focused session on initiating HEP, w/c mobility, transfer training for multiple household surfaces and therapeutic standing to problem solve safe progressing at home.   Therapeutic Activity: Transfers: Patient performed slide board transfers bed>w/c with set-up assist and supervision for board placement using R upper extremity.  Patient performed a slide board transfer w/c<>ADL couch with supervision onto the couch and min A off the couch due to unlevel incline and total A for board placement and stabilization of board during transfers. Provided min cues for hand placement, board placement, and head-hips relationship for proper technique and decreased assist with transfers.  Patient performed sit to/from stand x2 in the // bars with min-mod A and towel placed over L bar for elbow weight bearing for boosting up and controlled descent. Patient with improved compliance with NWB on R requiring only 2 cues to shift weight off of her R foot that was placed on therapists to gage weight bearing throughout. Patient maintained standing 10-20 sec each trial with cues for L hip and knee extension.  Wheelchair Mobility:  Patient propelled wheelchair >150  feet with supervision using R UE and L LE for propulsion. Provided verbal cues for propulsion and turning technique.   Therapeutic Exercise: Patient performed the following exercises with verbal and tactile cues for proper technique. -seated L hip flexion x10 -seated B LAQ with 3-5 sec hold in extension and flexion at end range -seated hamstring stretch on step stool with hip flexion <90 deg 2x20-30 sec (limited tolerance due to hamstring tightness)  Patient in w/c in the room at end of session with breaks locked and all needs within reach.   Session 2: Patient in w/c in the room ready to meet her daughters and family friend for family education upon PT arrival. Patient alert and agreeable to PT session. Patient reported 4-5/10 lower extremity pain during session, RN made aware. PT provided repositioning, rest breaks, and distraction as pain interventions throughout session.   Patient's daughters and family friend participated in hands-on training for mobility throughout session.   Therapeutic Activity: Bed Mobility: Patient performed rolling R/L and supine to/from sit with supervision-mod I in a flat bed without use of bed rails, using yoga blocks for elbow weight bearing to come to sitting on L. Performed supine to/from sit on R and L and determined patient performed with less effore to the R and discussed bed placement with family to accommodate this.   Transfers: Patient performed car transfer in her daughters personal sedan with supervision and set-up assist using slide board x2. Provided demonstration of first trial with cues for safe technique, and patient's friend performed second trial with min cues for set-up.  Patient performed slide board transfers bed<>w/c with set-up assist and supervision for board placement using R upper extremity and total  A with patient cuing family for placement on L. Family demonstrated set-up and guarding with min cues from patient, demonstrating patient's  ability to direct her care. Patient performed a slide board transfer w/c<>ADL couch with supervision onto the couch and min A off the couch due to unlevel incline and total A for board placement and stabilization of board during transfers. PT demonstrated technique. Discussed use of wide recliner to elevate lower extremities, family plans to purchase recliner of level height to w/c for ease of transfers and for LE elevation.  Patient performed sit to/from stand x2 using L PFRW with min A+2 with one person assist with supporting L elbow and second person assisting with boosting and placing a foot under her R foot to cue when weight bearing through R LE. PT demonstrated both assist techniques and performed first trial with patient's family friend and second trial was performed with family friend and patient's daughter demonstrating safe assist technique and appropriate cues.  Wheelchair Mobility:  Patient propelled wheelchair 50 feet to demonstrate household propulsion. Family reports furniture being moved for increased space for w/c mobility at home.  Patient in w/c with caregivers in the room for continued family education with OT in 15 min with breaks locked and all needs within reach.    Therapy Documentation Precautions:  Precautions Precautions: Posterior Hip Precaution Booklet Issued: No Precaution Comments: R posterior hip precautions, LUE WBing through elbow only, LLE WBAT for transfers only Restrictions Weight Bearing Restrictions: Yes RUE Weight Bearing: Weight bearing as tolerated LUE Weight Bearing: Weight bearing as tolerated RLE Weight Bearing: Non weight bearing LLE Weight Bearing: Weight bearing as tolerated   Agitated Behavior Scale: TBI Observation Details Observation Environment: therapy Start of observation period - Date: 07/09/21 Start of observation period - Time: 0847 End of observation period - Date: 07/09/21 End of observation period - Time: 1000 Agitated  Behavior Scale (DO NOT LEAVE BLANKS) Short attention span, easy distractibility, inability to concentrate: Absent Impulsive, impatient, low tolerance for pain or frustration: Absent Uncooperative, resistant to care, demanding: Absent Violent and/or threatening violence toward people or property: Absent Explosive and/or unpredictable anger: Absent Rocking, rubbing, moaning, or other self-stimulating behavior: Absent Pulling at tubes, restraints, etc.: Absent Wandering from treatment areas: Absent Restlessness, pacing, excessive movement: Absent Repetitive behaviors, motor, and/or verbal: Absent Rapid, loud, or excessive talking: Absent Sudden changes of mood: Absent Easily initiated or excessive crying and/or laughter: Absent Self-abusiveness, physical and/or verbal: Absent Agitated behavior scale total score: 14 Agitated Behavior Scale: TBI Observation Details Observation Environment: therapy Start of observation period - Date: 07/09/21 Start of observation period - Time: 1430 End of observation period - Date: 07/09/21 End of observation period - Time: 1525 Agitated Behavior Scale (DO NOT LEAVE BLANKS) Short attention span, easy distractibility, inability to concentrate: Absent Impulsive, impatient, low tolerance for pain or frustration: Absent Uncooperative, resistant to care, demanding: Absent Violent and/or threatening violence toward people or property: Absent Explosive and/or unpredictable anger: Absent Rocking, rubbing, moaning, or other self-stimulating behavior: Absent Pulling at tubes, restraints, etc.: Absent Wandering from treatment areas: Absent Restlessness, pacing, excessive movement: Absent Repetitive behaviors, motor, and/or verbal: Absent Rapid, loud, or excessive talking: Absent Sudden changes of mood: Absent Easily initiated or excessive crying and/or laughter: Absent Self-abusiveness, physical and/or verbal: Absent Agitated behavior scale total score:  14     Therapy/Group: Individual Therapy  Macy Polio L Hawke Villalpando PT, DPT  07/09/2021, 5:12 PM

## 2021-07-09 NOTE — Progress Notes (Signed)
PROGRESS NOTE   Subjective/Complaints: Doing well. Generally a bit sore but working through it. Family ed today. Excited and anxious about going home  ROS: Patient denies fever, rash, sore throat, blurred vision, nausea, vomiting, diarrhea, cough, shortness of breath or chest pain,   headache, or mood change.    Objective:   No results found. No results for input(s): WBC, HGB, HCT, PLT in the last 72 hours.   No results for input(s): NA, K, CL, CO2, GLUCOSE, BUN, CREATININE, CALCIUM in the last 72 hours.  Intake/Output Summary (Last 24 hours) at 07/09/2021 0921 Last data filed at 07/08/2021 1825 Gross per 24 hour  Intake 240 ml  Output --  Net 240 ml     Pressure Injury 06/10/21 Head Lower;Posterior Stage 1 -  Intact skin with non-blanchable redness of a localized area usually over a bony prominence. posterior head; difficult to assess d/t matted hair; approximately 3cm x 3cm? (Active)  06/10/21 0830  Location: Head  Location Orientation: Lower;Posterior  Staging: Stage 1 -  Intact skin with non-blanchable redness of a localized area usually over a bony prominence.  Wound Description (Comments): posterior head; difficult to assess d/t matted hair; approximately 3cm x 3cm?  Present on Admission: No    Physical Exam: Vital Signs Blood pressure (!) 155/91, pulse 96, temperature 98.8 F (37.1 C), temperature source Oral, resp. rate 18, height '5\' 8"'$  (1.727 m), weight 81.1 kg, SpO2 95 %.  Constitutional: No distress . Vital signs reviewed. HEENT: NCAT, EOMI, oral membranes moist Neck: supple Cardiovascular: RRR without murmur. No JVD    Respiratory/Chest: CTA Bilaterally without wheezes or rales. Normal effort    GI/Abdomen: BS +, non-tender, non-distended Ext: no clubbing, cyanosis, or edema Psych: pleasant and cooperative  Skin: Warm and dry.  All wounds, lacs healing, dry Psych: Normal mood.  Normal behavior. Musc:  No edema in extremities.  Left heel cord a little tight. Right knee rom still limited Neuro: Alert Motor: 5/5 in RUE,  LUE: Shoulder abduction 2+/5, distally limited by splint, unchanged, wrapped appropriately RLE 3- Hip and knee ext 4/5 ADF/PF, ?mild weakness right ADF/EHL, sensory loss on top of foot to medial ankle. LLE 3+/5 hip and knee ext 4/5 ADF/PF--stable  Assessment/Plan: 1. Functional deficits which require 3+ hours per day of interdisciplinary therapy in a comprehensive inpatient rehab setting. Physiatrist is providing close team supervision and 24 hour management of active medical problems listed below. Physiatrist and rehab team continue to assess barriers to discharge/monitor patient progress toward functional and medical goals  Care Tool:  Bathing    Body parts bathed by patient: Left arm, Chest, Abdomen, Right upper leg, Left upper leg, Face, Front perineal area   Body parts bathed by helper: Right arm, Buttocks, Right lower leg, Left lower leg     Bathing assist Assist Level: Moderate Assistance - Patient 50 - 74%     Upper Body Dressing/Undressing Upper body dressing   What is the patient wearing?: Pull over shirt    Upper body assist Assist Level: Supervision/Verbal cueing    Lower Body Dressing/Undressing Lower body dressing      What is the patient wearing?: Incontinence brief  Lower body assist Assist for lower body dressing: Maximal Assistance - Patient 25 - 49%     Toileting Toileting    Toileting assist Assist for toileting: Minimal Assistance - Patient > 75%     Transfers Chair/bed transfer  Transfers assist  Chair/bed transfer activity did not occur: Safety/medical concerns  Chair/bed transfer assist level: Supervision/Verbal cueing Chair/bed transfer assistive device: Sliding board, Armrests   Locomotion Ambulation   Ambulation assist   Ambulation activity did not occur: Safety/medical concerns          Walk 10 feet  activity   Assist  Walk 10 feet activity did not occur: Safety/medical concerns        Walk 50 feet activity   Assist Walk 50 feet with 2 turns activity did not occur: Safety/medical concerns         Walk 150 feet activity   Assist Walk 150 feet activity did not occur: Safety/medical concerns         Walk 10 feet on uneven surface  activity   Assist Walk 10 feet on uneven surfaces activity did not occur: Safety/medical concerns         Wheelchair     Assist Is the patient using a wheelchair?: Yes Type of Wheelchair: Manual Wheelchair activity did not occur: Safety/medical concerns  Wheelchair assist level: Supervision/Verbal cueing Max wheelchair distance: 25 ft    Wheelchair 50 feet with 2 turns activity    Assist    Wheelchair 50 feet with 2 turns activity did not occur: Safety/medical concerns       Wheelchair 150 feet activity     Assist  Wheelchair 150 feet activity did not occur: Safety/medical concerns       Blood pressure (!) 155/91, pulse 96, temperature 98.8 F (37.1 C), temperature source Oral, resp. rate 18, height '5\' 8"'$  (1.727 m), weight 81.1 kg, SpO2 95 %. Medical Problem List and Plan: 1.  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 Family ed, team conf today Night time tension PRAFO for LLE   2.  Antithrombotics: -DVT/anticoagulation:  Pharmaceutical: Other (comment) Eliquis             -antiplatelet therapy:  N/A 3. Pain Management: Oxycodone  prn  -pt prefers not to use oxydodone  - scheduled tramadol has been effective so far  -encouraged used of kpad -scheduled robaxin qid, ice Controlled with meds on 8/23 4. Mood/sleep:  LCSW to follow for evaluation and support.  -melatonin/trazodone             -antipsychotic agents:  dc seroquel, use trazodone '50mg'$  qhs for sleep instead  -appreciate neuropsych assessment 5. Neuropsych: This patient is almost capable of  making decisions on her own behalf. 6. Skin/Wound Care: Routine pressure relief measures.              -continue local wound care as needed to multiple op/injury sites -keep scalp dry 7. Fluids/Electrolytes/Nutrition: Monitor I/Os. 8. Left humerus Fx and Left BBFx s/p ORIF: OK to WB on left elbow.   --ROM as tolerated.  9. Right acetabular /Femur Fx s/p ORIF: NWB FOR 2 MORE WEEKS RLE from 8/20 with R-THR precautions   - right knee contracture. per Ortho: push ROM range of motion  -discussed again with pt today 10. Open left femur Fx s/p ORIF, left acetab fx and SPR/IPR fx's: WBAT LLE for gait, etc! 11. L-open BBFA Fx s/p ORIF: NWB on left wrist, now in brace  12. Acute blood loss anemia:   Hemoglobin 10.7 on 8/8--> 11.1 8/15  Continue to monitor 13. AKI: Resolved 14. PTSD/Insomnia: added prazosin as well as melatonin to help with sleep.             --monitor for now.seems to be in good spirits, has supportive family             --weaning seroquel---use at bedtime only             -neuropsych f/u appreciated 15. Urinary retention: voiding well 16. HTN: Monitor BP tid--  Vitals:   07/08/21 2007 07/09/21 0513  BP: (!) 145/87 (!) 155/91  Pulse: (!) 103 96  Resp: 18 18  Temp: 98.1 F (36.7 C) 98.8 F (37.1 C)  SpO2: 99% 95%   Mildly elevated again 8/15-8/19--increased prazosin to '2mg'$  qhs with some improvement   8/23 still elevated with metoprolol  '25mg'$  bid    -increase to '50mg'$  bid today    17.  Hypokalemia  Potassium 3.7 on 8/12---continue to supplement daily     LOS: 18 days A FACE TO FACE EVALUATION WAS PERFORMED  Meredith Staggers 07/09/2021, 9:21 AM

## 2021-07-10 NOTE — Progress Notes (Signed)
Physical Therapy Session Note  Patient Details  Name: Melissa Harrington MRN: [id number] Date of Birth: [birth year]  Today's Date: 07/10/2021 PT Individual Time: 0900-0930 PT Individual Time Calculation (min): 30 min   Short Term Goals: Week 3:  PT Short Term Goal 1 (Week 3): STG=LTG due to ELOS.   Skilled Therapeutic Interventions/Progress Updates:     Pt sitting up in w/c at start of session - agreeable to PT tx. Reports no pain but was premedicated by nursing. Pt transported to day room rehab gym in w/c for time. She requested to focus session on knee ROM. Sliding board transfer with setupA from w/c to mat table, going slightly uphill. Uses stacked yoga blocks for LUE weight bearing through elbow. Sit>supine wiith supervision and wedge behind back for support. Worked on AROM heel slides and hip abduction, all to tolerance. Pt lacking R knee flexion > L and R hip abduction > L as well. Performed 2 minutes of repetition for each there-ex. Assisted back to sitting position with minA for trunk management. Completed LAQ with focus more on knee flexion ROM more than strength. Completed sliding board transfer back to her w/c with steupA. Handoff of care to OT at end of session with pt sitting in w/c.   Therapy Documentation Precautions:  Precautions Precautions: Posterior Hip Precaution Booklet Issued: No Precaution Comments: R posterior hip precautions, LUE WBing through elbow only, LLE WBAT for transfers only Restrictions Weight Bearing Restrictions: Yes RUE Weight Bearing: Weight bearing as tolerated LUE Weight Bearing: Non weight bearing RLE Weight Bearing: Non weight bearing LLE Weight Bearing: Weight bearing as tolerated General:      Therapy/Group: Individual Therapy  Alger Simons 07/10/2021, 9:32 AM

## 2021-07-10 NOTE — Progress Notes (Signed)
Patient ID: Melissa Harrington, female   DOB: 01/27/1971, 50 y.o.   MRN: [id number]  SW met with pt and pt dtr Melissa Harrington in room to provide HHA list. SW will fu/ about preference.    , MSW, LCSWA Office: [phone] Cell: [phone] Fax: [phone]  

## 2021-07-10 NOTE — Progress Notes (Signed)
Occupational Therapy TBI Note  Patient Details  Name: Melissa Harrington MRN: [id number] Date of Birth: July 30, 1971  Today's Date: 07/10/2021 OT Individual Time: 1100-1130 OT Individual Time Calculation (min): 30 min   Today's Date: 07/10/2021 OT Individual Time: 1100-1130 OT Individual Time Calculation (min): 30 min   Short Term Goals: Week 1:  OT Short Term Goal 1 (Week 1): Pt will complete BSC transfer with +2 assist OT Short Term Goal 1 - Progress (Week 1): Met OT Short Term Goal 2 (Week 1): Pt will participate in 1 OT session while OOB for ~45 minutes to increase OOB tolerance OT Short Term Goal 2 - Progress (Week 1): Met OT Short Term Goal 3 (Week 1): Pt will complete 1 grooming task with Mod A using adaptive strategies OT Short Term Goal 3 - Progress (Week 1): Met  Skilled Therapeutic Interventions/Progress Updates:    Session 1:  Pt received in w/c direct handoff from PT.   Therapeutic activity Focus of session on vision intervention. Biggest findings of double vision in lower quadrants of vision. Bits visual scanning, rotation visual tracking/sequencing, sequencing trailmaking B pattern.   Pt left at end of session in w/c awaiting PT, call light in reach and all needs met  Session 2:  Pt received in w/c with ice provided for knee pain (unrated) at end of session.  Therapeutic activity Line bisection Bells cancellation test- missed 4 2 min 14 seconds- good scanning pattern L to R/top to bottom Letter cancellation with improved vision using an easel to raise height of paper as well practice at table height with pt reporting blurring of vision with L eye occluded.   Handout provided on reduced smell reported by patient. Pt reporting not likely covid symptom as has been going on since admission and more aroused from changing medications.   Pt left at end of session in bed with exit alarm on, call light in reach and all needs met   Therapy Documentation Precautions:   Precautions Precautions: Posterior Hip Precaution Booklet Issued: No Precaution Comments: R posterior hip precautions, LUE WBing through elbow only, LLE WBAT for transfers only Restrictions Weight Bearing Restrictions: Yes RUE Weight Bearing: Weight bearing as tolerated LUE Weight Bearing: Non weight bearing RLE Weight Bearing: Non weight bearing LLE Weight Bearing: Weight bearing as tolerated General:   Vital Signs:  Pain:   Agitated Behavior Scale: TBI  Observation Details Observation Environment: CIR Start of observation period - Date: 07/10/21 Start of observation period - Time: 1100 End of observation period - Date: 07/10/21 End of observation period - Time: 1130 Agitated Behavior Scale (DO NOT LEAVE BLANKS) Short attention span, easy distractibility, inability to concentrate: Absent Impulsive, impatient, low tolerance for pain or frustration: Absent Uncooperative, resistant to care, demanding: Absent Violent and/or threatening violence toward people or property: Absent Explosive and/or unpredictable anger: Absent Rocking, rubbing, moaning, or other self-stimulating behavior: Absent Pulling at tubes, restraints, etc.: Absent Wandering from treatment areas: Absent Restlessness, pacing, excessive movement: Absent Repetitive behaviors, motor, and/or verbal: Absent Rapid, loud, or excessive talking: Absent Sudden changes of mood: Absent Easily initiated or excessive crying and/or laughter: Absent Self-abusiveness, physical and/or verbal: Absent Agitated behavior scale total score: 14  ADL: ADL Eating: Not assessed Grooming: Maximal assistance Where Assessed-Grooming: Edge of bed Upper Body Bathing: Moderate assistance Where Assessed-Upper Body Bathing: Edge of bed Lower Body Bathing: Dependent, Other (comment) (2 helpers) Where Assessed-Lower Body Bathing: Edge of bed, Bed level Upper Body Dressing: Moderate assistance Where Assessed-Upper Body Dressing:  Edge of  bed Lower Body Dressing: Dependent, Other (Comment) (2 helpers) Where Assessed-Lower Body Dressing: Bed level, Edge of bed Toileting: Not assessed, Dependent, Other (Comment) (2 helpers) Where Assessed-Toileting: Bed level Toilet Transfer: Not assessed Tub/Shower Transfer: Not assessed Vision   Perception    Praxis   Exercises:   Other Treatments:     Therapy/Group: Individual Therapy  Tonny Branch 07/10/2021, 12:42 PM

## 2021-07-10 NOTE — Plan of Care (Signed)
  Problem: Consults Goal: RH GENERAL PATIENT EDUCATION Description: See Patient Education module for education specifics. Outcome: Progressing Goal: Skin Care Protocol Initiated - if Braden Score 18 or less Description: If consults are not indicated, leave blank or document N/A Outcome: Progressing Goal: Nutrition Consult-if indicated Outcome: Progressing   Problem: RH BOWEL ELIMINATION Goal: RH STG MANAGE BOWEL WITH ASSISTANCE Description: STG Manage Bowel with Assistance. min Outcome: Progressing   Problem: RH PAIN MANAGEMENT Goal: RH STG PAIN MANAGED AT OR BELOW PT'S PAIN GOAL Description: Less than 4 Outcome: Progressing   Problem: RH BLADDER ELIMINATION Goal: RH STG MANAGE BLADDER WITH MEDICATION WITH ASSISTANCE Description: STG Manage Bladder With Medication With Mod I Assistance. Outcome: Progressing   Problem: RH SKIN INTEGRITY Goal: RH STG MAINTAIN SKIN INTEGRITY WITH ASSISTANCE Description: STG Maintain Skin Integrity With Mod I Assistance. Outcome: Progressing Goal: RH STG ABLE TO PERFORM INCISION/WOUND CARE W/ASSISTANCE Description: STG Able To Perform Incision/Wound Care With Mod I Assistance. Outcome: Progressing   Problem: RH SAFETY Goal: RH STG DECREASED RISK OF FALL WITH ASSISTANCE Description: STG Decreased Risk of Fall With Mod I Assistance. Outcome: Progressing

## 2021-07-10 NOTE — Progress Notes (Signed)
Occupational Therapy Weekly Progress Note  Patient Details  Name: Melissa Harrington MRN: [id number] Date of Birth: [birth year]  Beginning of progress report period: July 02, 2021 End of progress report period: July 10, 2021  Today's Date: 07/10/2021 OT Individual Time: 2992-4268 OT Individual Time Calculation (min): 60 min    Patient has met 3 of 3 short term goals. [name] continues to make excellent progress. She has improved in her UB ADLs to supervision level, other than min A for untwisting bra at times. LB dressing is at mod A when in the w/c or BSC d/t difficulty with thorough enough lateral leans d/t R hip precautions and LUE WB through elbow only.   Patient continues to demonstrate the following deficits: muscle weakness and muscle joint tightness and decreased standing balance, decreased postural control, decreased balance strategies, and difficulty maintaining precautions and therefore will continue to benefit from skilled OT intervention to enhance overall performance with BADL and Reduce care partner burden.  Patient progressing toward long term goals..  Continue plan of care.  OT Short Term Goals Week 2:  OT Short Term Goal 1 (Week 2): Pt will don LB clothing using LRAD with mod A OT Short Term Goal 1 - Progress (Week 2): Met OT Short Term Goal 2 (Week 2): Pt will transfer to [facility] with CGA OT Short Term Goal 2 - Progress (Week 2): Met OT Short Term Goal 3 (Week 2): Pt will complete toileting tasks with mod A OT Short Term Goal 3 - Progress (Week 2): Met Week 3:  OT Short Term Goal 1 (Week 3): STG= LTG d/t ELOS  Skilled Therapeutic Interventions/Progress Updates:    Pt received supine with 2/10 pain in her R hip, described as soreness from yesterdays sessions. Pt completed LB bathing at bed level with CGA for assisting RLE to maintain posterior hip precautions. Increased assist provided to don pants at bed level d/t soreness and pt wanting to have pants on for SB transfer to  w/c. Bed mobility to EOB with (S) using bed rail. Pt completed slideboard transfer to the w/c with supervision- placing her own board. Pt completed UB bathing and dressing at the sink with min A only to don bra- to untwist in the back. Attempted to locate a BSC bucket but was unable. Pt was left sitting up in the w/c with all needs met.   Therapy Documentation Precautions:  Precautions Precautions: Posterior Hip Precaution Booklet Issued: No Precaution Comments: R posterior hip precautions, LUE WBing through elbow only, LLE WBAT for transfers only Restrictions Weight Bearing Restrictions: Yes RUE Weight Bearing: Weight bearing as tolerated LUE Weight Bearing: Non weight bearing RLE Weight Bearing: Non weight bearing LLE Weight Bearing: Weight bearing as tolerated  Therapy/Group: Individual Therapy  Curtis Sites 07/10/2021, 6:06 AM

## 2021-07-10 NOTE — Progress Notes (Addendum)
Physical Therapy Session Note  Patient Details  Name: Melissa Harrington MRN: [id number] Date of Birth: 09/08/1971  Today's Date: 07/10/2021 PT Individual Time: 1000-1100 PT Individual Time Calculation (min): 60 min   Short Term Goals: Week 3:  PT Short Term Goal 1 (Week 3): STG=LTG due to ELOS.  Skilled Therapeutic Interventions/Progress Updates:     Patient in w/c in the room upon PT arrival. Patient alert and agreeable to PT session. Patient reported 4/10 B pelvic pain and 8/10 R knee pain with ROM during session, RN made aware. PT provided repositioning, rest breaks, and distraction as pain interventions throughout session.   Therapeutic Activity: Bed Mobility: Patient performed supine to/from sit with with supervision on mat table. Provided verbal cues for bending her knees to bring her legs up onto the table. Transfers: Patient performed slide board transfers w/c<>mat table  with set-up A for board and w/c. Had patient teach back w/c set-up and board placement cues for teaching caregivers.   Manual Therapy: -gradual R knee extension in supine progressing to ankle on towel roll, progressed to lacking 8 deg, added overpressure x1 min, progressed to lacking 4 deg -ankle DF stretch with posterior glide to talus with 10 sec hold x5, 2 bouts 45 sec grade 3/4 A/P glide to talus for increased DF range, progressed from lacking 6 deg to lacking 2 deg from neutral -R knee flexion with muscle energy technique 3x10 sec stretch 5 sec contraction progressing from 66 deg to 82 deg in supine  Therapeutic Exercise: Patient performed 1 set of the following exercises with verbal and tactile cues for proper technique. Access Code: [email or web address] Supine Hip Abduction AROM - 2-3 x daily - 7 x weekly - 2 sets - 10 reps Supine Heel Slide - 2-3 x daily - 7 x weekly - 2 sets - 10 reps Supine Short Arc Quad - 2-3 x daily - 7 x weekly - 2 sets - 10 reps Supine Quadricep Sets - 2-3 x daily - 7 x weekly - 2 sets - 10  reps Seated Long Arc Quad - 2-3 x daily - 7 x weekly - 2 sets - 10 reps Seated March - 2-3 x daily - 7 x weekly - 2 sets - 10 reps  Played smooth jazz music to assist patient relaxation for improved pain management during manual therapy and there-ex.  Patient in w/c in the gym handed off to Melissa Harrington, [place].   Therapy Documentation Precautions:  Precautions Precautions: Posterior Hip Precaution Booklet Issued: No Precaution Comments: R posterior hip precautions, LUE WBing through elbow only, LLE WBAT for transfers only Restrictions Weight Bearing Restrictions: Yes RUE Weight Bearing: Weight bearing as tolerated LUE Weight Bearing: Non weight bearing RLE Weight Bearing: Non weight bearing LLE Weight Bearing: Weight bearing as tolerated    Therapy/Group: Individual Therapy  Melissa Harrington L Devaughn Savant PT, DPT  07/10/2021, 12:30 PM

## 2021-07-11 ENCOUNTER — Other Ambulatory Visit (HOSPITAL_COMMUNITY): Payer: Self-pay

## 2021-07-11 MED ORDER — METHOCARBAMOL 500 MG PO TABS
500.0000 mg | ORAL_TABLET | Freq: Four times a day (QID) | ORAL | Status: DC
  Administered 2021-07-11 – 2021-07-12 (×4): 500 mg via ORAL
  Filled 2021-07-11 (×4): qty 1

## 2021-07-11 NOTE — Progress Notes (Signed)
Physical Therapy TBI Note  Patient Details  Name: Melissa Harrington MRN: [id number] Date of Birth: 1971-11-09  Today's Date: 07/11/2021 PT Individual Time: 0910-1005 and 1325-1410 PT Individual Time Calculation (min): 55 min and 45 min   Short Term Goals: Week 3:  PT Short Term Goal 1 (Week 3): STG=LTG due to ELOS.  Skilled Therapeutic Interventions/Progress Updates:     Session 1: Patient in w/c in the room upon PT arrival. Patient alert and agreeable to PT session.   Focused session on d/c planning, d/c assessment (see d/c note), and patient education on TBI related deficits and recovery (focus on vision, fracture healing, contracture prevention, signs symptoms of heterotrophic ossification, fatigue, headache, and stress/pain management). Discussed self-care and slow return to daily activities using a consistent routine.   Therapeutic Activity: Bed Mobility: Patient performed rolling R/L and sit to supine with supervision-mod I in a flat bed without use of bed rails.  Transfers: Patient performed a slide board transfer w/c>bed with set-up assist for w/c and slide board placement.   ROM Assessment in supine: Left Lower Extremity: Hip flexion AROM: 108 deg; PROM: 117 deg Hip extension AROM/PROM: 0 deg (neutral) Knee flexion AROM: 92 deg; PROM: 116 deg Knee extension AROM: -12 deg; PROM: -2 deg Ankle DF AROM: 0 deg (90 deg neutral); PROM: 4 deg Right Lower Extremity:  Hip flexion AROM: 90 deg; PROM: 90 deg (limited due to precautions) Hip extension AROM/PROM: 0 deg (neutral) Knee flexion AROM: 79 deg; PROM: 83 deg Knee extension AROM: -14 deg; PROM: -3 deg Ankle DF AROM: -14 deg; PROM: -1 deg  Patient in bed at end of session with breaks locked, bed alarm set, and all needs within reach.   Session 2: Patient sitting EOB with her daughters and family friend present for family education upon PT arrival. Patient alert and agreeable to PT session. Patient denied pain during  session.  Focused session on education for HEP and promotion of improving ROM at home.   Reviewed HEP handout, use of MedBridge app, and demonstrated correct hand placement and assist with R lower extremity exercises. Educated on seated knee extension stretch with foot propped on chair, adding 1 lb weight to proximal tibia for increased stretch intermittently during the day, alternating between sitting and lying for maintaining neutral hip extension ROM, emphasized no pillows under her knees in lying, pain and edema control with exercises and therapies, reviewed all precautions with patient using teach-back method to teach caregivers, and safety awareness with exercises and mobility.  Therapeutic Exercise: Patient performed 1 set of the following exercises, provided with HEP handout, with verbal and tactile cues for proper technique. Access Code: [email or web address] Supine Hip Abduction AROM - 2-3 x daily - 7 x weekly - 2 sets - 10 reps Supine Heel Slide - 2-3 x daily - 7 x weekly - 2 sets - 10 reps Supine Short Arc Quad - 2-3 x daily - 7 x weekly - 2 sets - 10 reps Supine Quadricep Sets - 2-3 x daily - 7 x weekly - 2 sets - 10 reps Seated Long Arc Quad - 2-3 x daily - 7 x weekly - 2 sets - 10 reps Seated March on L only - 2-3 x daily - 7 x weekly - 2 sets - 10 reps R knee/hip flexion with muscle energy technique in supine - 2-3 x daily - 7 x weekly - 2 sets - 3x10 sec stretch 5 sec contraction  Seated hamstring stretch - 2-3 x daily -  7 x weekly - 2 sets - 30-60 sec hold  Patient in bed with caregivers in the room, cleared caregivers for transfers, safety plan updated at end of session with breaks locked and all needs within reach.   Therapy Documentation Precautions:  Precautions Precautions: Posterior Hip, Fall Precaution Booklet Issued: No Precaution Comments: R posterior hip precautions, LUE WBing through elbow only (no lifting, pulling, pushing), LLE WBAT Required Braces or Orthoses:  Splint/Cast Splint/Cast: L velcro wrist brace, DF Adjustable Night Splint Restrictions Weight Bearing Restrictions: Yes RUE Weight Bearing: Weight bearing as tolerated LUE Weight Bearing: Weight bear through elbow only RLE Weight Bearing: Non weight bearing LLE Weight Bearing: Weight bearing as tolerated Other Position/Activity Restrictions: No ROM restrictions for L UE or LE, continue R posterior hip precautions Agitated Behavior Scale: TBI Observation Details Observation Environment: CIR Start of observation period - Date: 07/11/21 Start of observation period - Time: 1325 End of observation period - Date: 10/11/21 End of observation period - Time: 1410 Agitated Behavior Scale (DO NOT LEAVE BLANKS) Short attention span, easy distractibility, inability to concentrate: Absent Impulsive, impatient, low tolerance for pain or frustration: Absent Uncooperative, resistant to care, demanding: Absent Violent and/or threatening violence toward people or property: Absent Explosive and/or unpredictable anger: Absent Rocking, rubbing, moaning, or other self-stimulating behavior: Absent Pulling at tubes, restraints, etc.: Absent Wandering from treatment areas: Absent Restlessness, pacing, excessive movement: Absent Repetitive behaviors, motor, and/or verbal: Absent Rapid, loud, or excessive talking: Absent Sudden changes of mood: Absent Easily initiated or excessive crying and/or laughter: Absent Self-abusiveness, physical and/or verbal: Absent Agitated behavior scale total score: 14    Therapy/Group: Individual Therapy  Melissa Harrington L Melissa Harrington PT, DPT  07/11/2021, 4:58 PM

## 2021-07-11 NOTE — Plan of Care (Signed)
  Problem: Consults Goal: RH GENERAL PATIENT EDUCATION Description: See Patient Education module for education specifics. Outcome: Progressing Goal: Skin Care Protocol Initiated - if Braden Score 18 or less Description: If consults are not indicated, leave blank or document N/A Outcome: Progressing Goal: Nutrition Consult-if indicated Outcome: Progressing   Problem: RH BOWEL ELIMINATION Goal: RH STG MANAGE BOWEL WITH ASSISTANCE Description: STG Manage Bowel with Assistance. min Outcome: Progressing   Problem: RH PAIN MANAGEMENT Goal: RH STG PAIN MANAGED AT OR BELOW PT'S PAIN GOAL Description: Less than 4 Outcome: Progressing   Problem: RH BLADDER ELIMINATION Goal: RH STG MANAGE BLADDER WITH MEDICATION WITH ASSISTANCE Description: STG Manage Bladder With Medication With Mod I Assistance. Outcome: Progressing   Problem: RH SKIN INTEGRITY Goal: RH STG MAINTAIN SKIN INTEGRITY WITH ASSISTANCE Description: STG Maintain Skin Integrity With Mod I Assistance. Outcome: Progressing Goal: RH STG ABLE TO PERFORM INCISION/WOUND CARE W/ASSISTANCE Description: STG Able To Perform Incision/Wound Care With Mod I Assistance. Outcome: Progressing   Problem: RH SAFETY Goal: RH STG DECREASED RISK OF FALL WITH ASSISTANCE Description: STG Decreased Risk of Fall With Mod I Assistance. Outcome: Progressing
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
## 2021-07-11 NOTE — Progress Notes (Signed)
Orthopedic Tech Progress Note Patient Details:  BENELLI SCHUELKE 23-Nov-1970 [id number] Called in new order to [facility] Patient ID: [name], female   DOB: June 30, 1971, 50 y.o.   MRN: [id number]  Chip Boer 07/11/2021, 10:25 AM

## 2021-07-11 NOTE — TOC Benefit Eligibility Note (Signed)
Patient Teacher, English as a foreign language completed.    The patient is currently admitted and upon discharge could be taking Eliquis 5 mg.  The current 30 day co-pay is, $35.00.   The patient is insured through [place], [place] Patient Advocate Specialist [place] Team Direct Number: [phone]  Fax: [phone]
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
## 2021-07-11 NOTE — Progress Notes (Signed)
Inpatient Rehabilitation Care Coordinator Discharge Note   Patient Details  Name: Melissa Harrington MRN: [id number] Date of Birth: 09/26/1971   Discharge location: Dc to home with 24/7 care from various supports. Primary caregivers will be her daughters and friend [name] who has come in to assist with care needs.  Length of Stay: 20 days  Discharge activity level: Min A to set up Assist  Home/community participation: LImited  Patient response [id number] Literacy - How often do you need to have someone help you when you read instructions, pamphlets, or other written material from your doctor or pharmacy?: Never  Patient response [id number] Isolation - How often do you feel lonely or isolated from those around you?: Never  Services provided included: MD, RD, OT, PT, RN, CM, TR, Pharmacy, Neuropsych, SW  Financial Services:  Charity fundraiser Utilized: [place] offered to/list presented to: Yes  Follow-up services arranged:  [place], DME Home Health Agency: [place] for HHPT/OT/aide    DME : [place] w/c, slide board, [place]    Patient response to transportation need: Is the patient able to respond to transportation needs?: Yes In the past 12 months, has lack of transportation kept you from medical appointments or from getting medications?: No In the past 12 months, has lack of transportation kept you from meetings, work, or from getting things needed for daily living?: No  Comments (or additional information):  Patient/Family verbalized understanding of follow-up arrangements:  Yes  Individual responsible for coordination of the follow-up plan: contact pt or pt dtr [name] [phone]  Confirmed correct DME delivered: Rana Snare 07/11/2021    Rana Snare

## 2021-07-11 NOTE — Progress Notes (Signed)
Physical Therapy Discharge Summary  Patient Details  Name: Melissa Harrington MRN: [id number] Date of Birth: 1971-09-21  Patient has met 9 of 9 long term goals due to improved activity tolerance, improved balance, improved postural control, increased strength, increased range of motion, decreased pain, ability to compensate for deficits, improved attention, and improved awareness.  Patient to discharge at a wheelchair level Min Assist-set-up assist.   Patient's care partner is independent to provide the necessary physical assistance at discharge.  Reasons goals not met: n/a  Recommendation:  Patient will benefit from ongoing skilled PT services in home health setting to continue to advance safe functional mobility, address ongoing impairments in strength/ROM, functional mobility, activity/pain tolerance, gait training when able, visual retraining, balance, patient/caregiver education, and minimize fall risk.  Equipment: 18x18" light weight wheelchair with B ELRs, slide board, L PFRW  Reasons for discharge: treatment goals met  Patient/family agrees with progress made and goals achieved: Yes  PT Discharge Precautions/Restrictions Precautions Precautions: Posterior Hip;Fall Precaution Comments: R posterior hip precautions, LUE WBing through elbow only (no lifting, pulling, pushing), LLE WBAT Required Braces or Orthoses: Splint/Cast Splint/Cast: L velcro wrist brace, DF Adjustable Night Splint Restrictions Weight Bearing Restrictions: Yes LUE Weight Bearing: Weight bear through elbow only RLE Weight Bearing: Non weight bearing LLE Weight Bearing: Weight bearing as tolerated Other Position/Activity Restrictions: No ROM restrictions for L UE or LE, continue R posterior hip precautions Pain Interference Pain Interference Pain Effect on Sleep: 2. Occasionally Pain Interference with Therapy Activities: 2. Occasionally Pain Interference with Day-to-Day Activities: 2.  Occasionally Vision/Perception  Vision - History Ability to See in Adequate Light: 1 Impaired Vision - Assessment Eye Alignment: Impaired (comment) (L esophoria) Alignment/Gaze Preference: Within Defined Limits Tracking/Visual Pursuits: Able to track stimulus in all quads without difficulty Saccades: Within functional limits Convergence: Impaired (comment) Diplopia Assessment: Disappears with one eye closed;Present in near gaze (objects cross over, worse with near gaze (reading), improves with R eye covered) Perception Perception: Within Functional Limits Praxis Praxis: Intact  Cognition Overall Cognitive Status: Within Functional Limits for tasks assessed Arousal/Alertness: Awake/alert Orientation Level: Oriented X4 Sustained Attention: Appears intact Memory: Appears intact Awareness: Appears intact Problem Solving: Appears intact Safety/Judgment: Appears intact [employer] Scales of Cognitive Functioning: Purposeful/appropriate Sensation Sensation Light Touch: Impaired Detail Peripheral sensation comments: Sensation intact throughout with testing, patient reports reduced sensation over L forehead and R lateral lower leg to great toe Light Touch Impaired Details: Impaired RLE Proprioception: Appears Intact Coordination Gross Motor Movements are Fluid and Coordinated: No Fine Motor Movements are Fluid and Coordinated: No Coordination and Movement Description: Affected by multiple fractures, pain, edema, and motion and weight bearing precautions Motor  Motor Motor: Other (comment);Abnormal postural alignment and control Motor - Discharge Observations: Patient with limiteations due to polytrauma with B LE and L UE fractures and weight bearinng precautions, much improved with functional mobility during admission to CIR.  Mobility Bed Mobility Bed Mobility: Rolling Right;Rolling Left Rolling Right: Independent Rolling Left: Independent Supine to Sit: Independent Sit to  Supine: Supervision/Verbal cueing Transfers Transfers: Sit to Stand;Stand to Sit;Lateral/Scoot Transfers Sit to Stand: Minimal Assistance - Patient > 75% (+1 assist in // bars, +2 assist with L PFRW for safety) Stand to Sit: Minimal Assistance - Patient > 75% (+1 assist in // bars, +2 assist with L PFRW for safety) Lateral/Scoot Transfers: Set up assist (with slide board) Transfer (Assistive device): Left platform walker Locomotion  Gait Ambulation: No (limited by pain and strength deficits, unable to maintain  weight bearing precautions in prolonged standing) Gait Gait: No Stairs / Additional Locomotion Stairs: No Wheelchair Mobility Wheelchair Mobility: Yes Wheelchair Assistance: Supervision/Verbal cueing Wheelchair Propulsion: Right upper extremity;Left lower extremity Wheelchair Parts Management: Needs assistance Distance: >100 ft  Trunk/Postural Assessment  Cervical Assessment Cervical Assessment: Exceptions to [facility] (mild foreward head) Thoracic Assessment Thoracic Assessment: Exceptions to [facility] (rounded shoulders) Lumbar Assessment Lumbar Assessment: Exceptions to [facility] (posterior pelvic tilt) Postural Control Postural Control: Deficits on evaluation (decreased/delayed in standing due to precautions)  Balance Static Sitting Balance Static Sitting - Balance Support: No upper extremity supported;Feet supported Static Sitting - Level of Assistance: 7: Independent Dynamic Sitting Balance Dynamic Sitting - Balance Support: Feet supported;During functional activity;No upper extremity supported Dynamic Sitting - Level of Assistance: 7: Independent Dynamic Sitting - Balance Activities: Lateral lean/weight shifting;Forward lean/weight shifting Static Standing Balance Static Standing - Balance Support: Bilateral upper extremity supported;During functional activity Static Standing - Level of Assistance: 4: Min assist Static Standing - Comment/# of Minutes: +1 assist in // bars, +2  assist with L PFRW for safety Extremity Assessment  RLE Assessment RLE Assessment: Exceptions to [facility] Passive Range of Motion (PROM) Comments: Hip flexion 90 deg (limited due to precautions), Hip extension 0 deg (neutral), Knee flexion 83 deg, Knee extension -3 deg, PROM: -1 deg Active Range of Motion (AROM) Comments: Hip flexion 90 deg (limited due to precautions), Hip extension 0 deg (neutral), Knee flexion 79 deg, Knee extension -14 deg,  Ankle DF -14 deg RLE Strength RLE Overall Strength: Deficits;Due to precautions;Due to pain RLE Overall Strength Comments: NWB, posterior hip precautions Right Hip Flexion: 3/5 Right Hip ABduction: 2/5 Right Knee Flexion: 3/5 Right Knee Extension: 3/5 Right Ankle Dorsiflexion: 2-/5 Right Ankle Plantar Flexion: 3/5 Right Ankle Inversion: 3/5 Right Ankle Eversion: 2-/5 LLE Assessment LLE Assessment: Exceptions to [facility] Passive Range of Motion (PROM) Comments: Hip flexion 117 deg, Hip extension 0 deg (neutral), Knee flexion 116 deg, Knee extension -2 deg,  Ankle DF PROM: 4 deg Active Range of Motion (AROM) Comments: Hip flexion 108 deg, Hip extension 0 deg (neutral), Knee flexion 92 deg, Knee extension -12 deg,  Ankle DF 0 deg (90 deg neutral) LLE Strength LLE Overall Strength: Deficits;Due to pain Left Hip Flexion: 3+/5 Left Hip ABduction: 2+/5 Left Hip ADduction: 2+/5 Left Knee Flexion: 3+/5 Left Knee Extension: 3+/5 Left Ankle Dorsiflexion: 4/5 Left Ankle Plantar Flexion: 4/5 Left Ankle Inversion: 4/5 Left Ankle Eversion: 4/5    Raizel Wesolowski L Tareq Dwan PT, DPT  07/11/2021, 6:01 PM

## 2021-07-12 ENCOUNTER — Other Ambulatory Visit (HOSPITAL_COMMUNITY): Payer: Self-pay

## 2021-07-12 MED ORDER — METHOCARBAMOL 500 MG PO TABS
500.0000 mg | ORAL_TABLET | Freq: Four times a day (QID) | ORAL | 0 refills | Status: DC
  Filled 2021-07-12: qty 120, 30d supply, fill #0

## 2021-07-12 MED ORDER — ACETAMINOPHEN 325 MG PO TABS
650.0000 mg | ORAL_TABLET | Freq: Four times a day (QID) | ORAL | 0 refills | Status: AC
  Filled 2021-07-12: qty 100, 13d supply, fill #0

## 2021-07-12 MED ORDER — TRAZODONE HCL 50 MG PO TABS
50.0000 mg | ORAL_TABLET | Freq: Every day | ORAL | 0 refills | Status: DC
  Filled 2021-07-12: qty 30, 30d supply, fill #0

## 2021-07-12 MED ORDER — ALPRAZOLAM 0.5 MG PO TABS
0.2500 mg | ORAL_TABLET | Freq: Two times a day (BID) | ORAL | 0 refills | Status: DC | PRN
  Filled 2021-07-12: qty 10, 10d supply, fill #0

## 2021-07-12 MED ORDER — ASCORBIC ACID 500 MG PO TABS
500.0000 mg | ORAL_TABLET | Freq: Every day | ORAL | 0 refills | Status: DC
  Filled 2021-07-12: qty 30, 30d supply, fill #0

## 2021-07-12 MED ORDER — PRAZOSIN HCL 1 MG PO CAPS
2.0000 mg | ORAL_CAPSULE | Freq: Every day | ORAL | 0 refills | Status: DC
  Filled 2021-07-12: qty 60, 30d supply, fill #0

## 2021-07-12 MED ORDER — CERTAVITE/ANTIOXIDANTS PO TABS
1.0000 | ORAL_TABLET | Freq: Every day | ORAL | 0 refills | Status: DC
  Filled 2021-07-12: qty 30, 30d supply, fill #0

## 2021-07-12 MED ORDER — MELATONIN 3 MG PO TABS
3.0000 mg | ORAL_TABLET | Freq: Every day | ORAL | 0 refills | Status: AC
  Filled 2021-07-12: qty 30, 30d supply, fill #0

## 2021-07-12 MED ORDER — METOPROLOL TARTRATE 50 MG PO TABS
50.0000 mg | ORAL_TABLET | Freq: Two times a day (BID) | ORAL | 0 refills | Status: DC
  Filled 2021-07-12: qty 60, 30d supply, fill #0

## 2021-07-12 MED ORDER — TRAMADOL HCL 50 MG PO TABS
50.0000 mg | ORAL_TABLET | Freq: Three times a day (TID) | ORAL | 3 refills | Status: DC
  Filled 2021-07-12: qty 28, 7d supply, fill #0

## 2021-07-12 MED ORDER — FOLIC ACID 1 MG PO TABS
1.0000 mg | ORAL_TABLET | Freq: Every day | ORAL | 0 refills | Status: DC
  Filled 2021-07-12: qty 30, 30d supply, fill #0

## 2021-07-12 MED ORDER — ACETAMINOPHEN 325 MG PO TABS: 650.0000 mg | ORAL_TABLET | Freq: Four times a day (QID) | ORAL | Status: DC

## 2021-07-12 MED ORDER — TRAMADOL HCL 50 MG PO TABS
50.0000 mg | ORAL_TABLET | Freq: Three times a day (TID) | ORAL | 0 refills | Status: DC
  Filled 2021-07-12: qty 28, 7d supply, fill #0

## 2021-07-12 MED ORDER — APIXABAN 5 MG PO TABS
5.0000 mg | ORAL_TABLET | Freq: Two times a day (BID) | ORAL | 0 refills | Status: DC
  Filled 2021-07-12: qty 60, 30d supply, fill #0

## 2021-07-12 NOTE — Progress Notes (Signed)
Patient discharge home with family. Patient received medication and equipment to take home. Patient left with daughters and left in good spirits. Sanda Linger, LPN

## 2021-07-12 NOTE — Progress Notes (Signed)
PROGRESS NOTE   Subjective/Complaints: A little sore but doing very well. Excited to be getting home. Still some anxiety too. Received night splint  ROS: Patient denies fever, rash, sore throat, blurred vision, nausea, vomiting, diarrhea, cough, shortness of breath or chest pain,   headache, or mood change.    Objective:   No results found. No results for input(s): WBC, HGB, HCT, PLT in the last 72 hours.   No results for input(s): NA, K, CL, CO2, GLUCOSE, BUN, CREATININE, CALCIUM in the last 72 hours.  Intake/Output Summary (Last 24 hours) at 07/12/2021 0826 Last data filed at 07/12/2021 0300 Gross per 24 hour  Intake 360 ml  Output 2 ml  Net 358 ml     Pressure Injury 06/10/21 Head Lower;Posterior Stage 1 -  Intact skin with non-blanchable redness of a localized area usually over a bony prominence. posterior head; difficult to assess d/t matted hair; approximately 3cm x 3cm? (Active)  06/10/21 0830  Location: Head  Location Orientation: Lower;Posterior  Staging: Stage 1 -  Intact skin with non-blanchable redness of a localized area usually over a bony prominence.  Wound Description (Comments): posterior head; difficult to assess d/t matted hair; approximately 3cm x 3cm?  Present on Admission: No    Physical Exam: Vital Signs Blood pressure (!) 132/92, pulse 93, temperature 98.6 F (37 C), temperature source Oral, resp. rate 16, height '5\' 8"'$  (1.727 m), weight 81.1 kg, SpO2 97 %.  Constitutional: No distress . Vital signs reviewed. HEENT: NCAT, EOMI, oral membranes moist Neck: supple Cardiovascular: RRR without murmur. No JVD    Respiratory/Chest: CTA Bilaterally without wheezes or rales. Normal effort    GI/Abdomen: BS +, non-tender, non-distended Ext: no clubbing, cyanosis, or edema Psych: pleasant and cooperative  Skin: Warm and dry.  All wounds, lacs healing, dry, scabs drying up Musc: No edema in extremities.   Left heel cord still a tight. Right knee 10-80+ Neuro: Alert Motor: 5/5 in RUE,  LUE: Shoulder abduction 2+/5, distally limited by splint, unchanged, wrapped appropriately RLE 3- Hip and knee ext 4/5 ADF/PF, ?mild weakness right ADF/EHL, sensory loss on top of foot to medial ankle.--stable  LLE 3+/5 hip and knee ext 4/5 ADF/PF-- stable  Assessment/Plan: 1. Functional deficits which require 3+ hours per day of interdisciplinary therapy in a comprehensive inpatient rehab setting. Physiatrist is providing close team supervision and 24 hour management of active medical problems listed below. Physiatrist and rehab team continue to assess barriers to discharge/monitor patient progress toward functional and medical goals  Care Tool:  Bathing    Body parts bathed by patient: Left arm, Chest, Abdomen, Right upper leg, Left upper leg, Face, Front perineal area, Right arm, Buttocks, Right lower leg, Left lower leg   Body parts bathed by helper: Right arm, Buttocks, Right lower leg, Left lower leg     Bathing assist Assist Level: Supervision/Verbal cueing     Upper Body Dressing/Undressing Upper body dressing   What is the patient wearing?: Bra, Dress    Upper body assist Assist Level: Set up assist    Lower Body Dressing/Undressing Lower body dressing      What is the patient wearing?:  (dress  past hips)     Lower body assist Assist for lower body dressing: Contact Guard/Touching assist     Toileting Toileting    Toileting assist Assist for toileting: Supervision/Verbal cueing     Transfers Chair/bed transfer  Transfers assist  Chair/bed transfer activity did not occur: Safety/medical concerns  Chair/bed transfer assist level: Set up assist Chair/bed transfer assistive device: Sliding board   Locomotion Ambulation   Ambulation assist   Ambulation activity did not occur: Safety/medical concerns (unable to maintain R LE NWB)          Walk 10 feet  activity   Assist  Walk 10 feet activity did not occur: Safety/medical concerns        Walk 50 feet activity   Assist Walk 50 feet with 2 turns activity did not occur: Safety/medical concerns         Walk 150 feet activity   Assist Walk 150 feet activity did not occur: Safety/medical concerns         Walk 10 feet on uneven surface  activity   Assist Walk 10 feet on uneven surfaces activity did not occur: Safety/medical concerns         Wheelchair     Assist Is the patient using a wheelchair?: Yes Type of Wheelchair: Manual Wheelchair activity did not occur: Safety/medical concerns  Wheelchair assist level: Independent Max wheelchair distance: >150 ft    Wheelchair 50 feet with 2 turns activity    Assist    Wheelchair 50 feet with 2 turns activity did not occur: Safety/medical concerns   Assist Level: Supervision/Verbal cueing, Independent   Wheelchair 150 feet activity     Assist  Wheelchair 150 feet activity did not occur: Safety/medical concerns   Assist Level: Independent   Blood pressure (!) 132/92, pulse 93, temperature 98.6 F (37 C), temperature source Oral, resp. rate 16, height '5\' 8"'$  (1.727 m), weight 81.1 kg, SpO2 97 %. Medical Problem List and Plan: 1.  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 F/u with [facility], ortho, primary, HH services Night time tension PRAFO for LLE  2.  Antithrombotics: -DVT/anticoagulation:  Pharmaceutical: Other (comment) Eliquis             -antiplatelet therapy:  N/A 3. Pain Management: Oxycodone  prn  -pt prefers not to use oxydodone  - scheduled tramadol has been effective so far  -encouraged used of kpad -scheduled robaxin qid, ice---reduced robaxin to '500mg'$  Controlled with meds on 8/26 4. Mood/sleep:  LCSW to follow for evaluation and support.  -melatonin/trazodone             -antipsychotic agents:  dc'ed seroquel, using trazodone '50mg'$  qhs for  sleep instead  -send her home with a few xanax to use prn as well 5. Neuropsych: This patient is almost capable of making decisions on her own behalf. 6. Skin/Wound Care:               -all areas scabbed, peeling -keep scalp dry 7. Fluids/Electrolytes/Nutrition: Monitor I/Os. 8. Left humerus Fx and Left BBFx s/p ORIF: OK to WB on left elbow.   --ROM as tolerated.  9. Right acetabular /Femur Fx s/p ORIF: NWB FOR 2 MORE WEEKS RLE from 8/20 with R-THR precautions   - right knee contracture. per Ortho: push ROM range of motion  -knee ROM improving 10. Open left femur Fx s/p ORIF, left acetab fx and SPR/IPR fx's: WBAT LLE for gait, etc! 11. L-open BBFA Fx s/p ORIF:  NWB on left wrist, now in brace 12. Acute blood loss anemia:   Hemoglobin 10.7 on 8/8--> 11.1 8/15  Continue to monitor 13. AKI: Resolved 14. PTSD/Insomnia: continue prazosin as well as melatonin to help with sleep.             -outpt follow up as needed -has supportive family 21. Urinary retention: voiding well 16. HTN: Monitor BP tid--  Vitals:   07/11/21 1924 07/12/21 0500  BP: 132/78 (!) 132/92  Pulse: 92 93  Resp: 17 16  Temp: 98.6 F (37 C) 98.6 F (37 C)  SpO2: 97% 97%   Mildly elevated again 8/15-8/19--increased prazosin to '2mg'$  qhs with some improvement   8/26  improvement with '50mg'$  bid metoprolol--continue    17.  Hypokalemia  Potassium 3.7 on 8/12---continue to supplement daily     LOS: 21 days A FACE TO FACE EVALUATION WAS PERFORMED  Meredith Staggers 07/12/2021, 8:26 AM
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
## 2021-07-12 NOTE — Progress Notes (Signed)
Pharmacy Medication Review  Discharge medications reviewed No changes needed Avapro/HCTZ replaced by metoprolol, prazosin Eliquis education completed / AVS instructions added / benefits check complete  Prescriptions sent to [place] pharmacy  Thank you. Anette Guarneri, PharmD

## 2021-07-15 ENCOUNTER — Telehealth: Payer: Self-pay | Admitting: *Deleted

## 2021-07-15 ENCOUNTER — Telehealth: Payer: Self-pay

## 2021-07-15 DIAGNOSIS — Z7901 Long term (current) use of anticoagulants: Secondary | ICD-10-CM

## 2021-07-15 NOTE — Telephone Encounter (Signed)
Amy Small, RN calling from [facility] requesting PT orders for patient.   Physical Therapy - 2 times weekly for duration of 4 weeks   Please call Amy at [phone].  This is a secured line, so you are able to leave a voicemail, if she is not available.

## 2021-07-15 NOTE — Telephone Encounter (Signed)
Transition Care Management Unsuccessful Follow-up Telephone Call  Date of discharge and from where:  [place] 07-12-2021  Attempts:  1st Attempt  Reason for unsuccessful TCM follow-up call:  Unable to leave message  voicemail not set up

## 2021-07-16 ENCOUNTER — Other Ambulatory Visit: Payer: Self-pay

## 2021-07-16 ENCOUNTER — Other Ambulatory Visit (HOSPITAL_COMMUNITY): Payer: Self-pay

## 2021-07-16 DIAGNOSIS — I2699 Other pulmonary embolism without acute cor pulmonale: Secondary | ICD-10-CM

## 2021-07-16 NOTE — Telephone Encounter (Signed)
Spoke with pt and she would prefer hospital f/u to be virtual due to still having mobility issues and currently using a wheelchair. I will get someone to assist with scheduling hospital f/u virtually. She [facility] [place]. Please clarify if cbc or cbc with diff and provide dx and I will enter future order.  [11:47 AM] Melissa Harrington basically hospital follow up for the pulmonary embolus she had in hospital, she is on chronic anticoagulant med.  I'll need to monitor her blood count so if a cbc can be done through [facility] nursing that would be great, otherwise is there any way she could come to our lab for blood draw?  This is no hurry, though.

## 2021-07-16 NOTE — Telephone Encounter (Signed)
Yes okay. I'll need to see her for VV (or in person if she prefers) in the next couple weeks.

## 2021-07-16 NOTE — Telephone Encounter (Signed)
Ok for orders? 

## 2021-07-16 NOTE — Telephone Encounter (Signed)
Left secure VM advising ok for VO for PT orders.

## 2021-07-17 ENCOUNTER — Telehealth: Payer: Self-pay

## 2021-07-17 NOTE — Telephone Encounter (Signed)
This is fine. CBC no diff. Dx chronic anticoagulation.-thx

## 2021-07-17 NOTE — Telephone Encounter (Signed)
Can you assist pt with scheduling hospital f/u as virtual?

## 2021-07-17 NOTE — Telephone Encounter (Signed)
Hospital discharge notes reviewed. Per protocol in-home occupational therapy verbal given. To be once a week for one week, twice a week for 3 weeks then once a week for two weeks.

## 2021-07-18 NOTE — Telephone Encounter (Signed)
Tried to call patient to schedule but no answer and no voicemail available. Will keep trying to reach patient.

## 2021-07-18 NOTE — Telephone Encounter (Signed)
Patient called back and scheduled VV hospital followup for 07/19/21 at 1130.

## 2021-07-19 ENCOUNTER — Encounter: Payer: Self-pay | Admitting: Family Medicine

## 2021-07-19 ENCOUNTER — Telehealth (INDEPENDENT_AMBULATORY_CARE_PROVIDER_SITE_OTHER): Payer: 59 | Admitting: Family Medicine

## 2021-07-19 VITALS — BP 128/73

## 2021-07-19 DIAGNOSIS — F4310 Post-traumatic stress disorder, unspecified: Secondary | ICD-10-CM

## 2021-07-19 DIAGNOSIS — Z7901 Long term (current) use of anticoagulants: Secondary | ICD-10-CM

## 2021-07-19 DIAGNOSIS — I2694 Multiple subsegmental pulmonary emboli without acute cor pulmonale: Secondary | ICD-10-CM

## 2021-07-19 DIAGNOSIS — T07XXXA Unspecified multiple injuries, initial encounter: Secondary | ICD-10-CM | POA: Diagnosis not present

## 2021-07-19 DIAGNOSIS — I10 Essential (primary) hypertension: Secondary | ICD-10-CM | POA: Diagnosis not present

## 2021-07-19 MED ORDER — TRAMADOL HCL 50 MG PO TABS: 50.0000 mg | ORAL_TABLET | Freq: Three times a day (TID) | ORAL | 3 refills | Status: DC

## 2021-07-19 MED ORDER — ALPRAZOLAM 0.5 MG PO TABS: 0.2500 mg | ORAL_TABLET | Freq: Two times a day (BID) | ORAL | 2 refills | Status: DC | PRN
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
## 2021-07-24 ENCOUNTER — Other Ambulatory Visit: Payer: Self-pay

## 2021-07-24 ENCOUNTER — Encounter: Payer: 59 | Attending: Registered Nurse | Admitting: Registered Nurse

## 2021-07-24 ENCOUNTER — Encounter: Payer: Self-pay | Admitting: Registered Nurse

## 2021-07-24 VITALS — BP 112/73 | HR 75 | Temp 98.1°F | Ht 68.0 in | Wt 176.0 lb

## 2021-07-24 DIAGNOSIS — T07XXXA Unspecified multiple injuries, initial encounter: Secondary | ICD-10-CM

## 2021-07-24 DIAGNOSIS — S069X9A Unspecified intracranial injury with loss of consciousness of unspecified duration, initial encounter: Secondary | ICD-10-CM

## 2021-07-24 DIAGNOSIS — F4310 Post-traumatic stress disorder, unspecified: Secondary | ICD-10-CM | POA: Diagnosis not present
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
## 2021-07-24 NOTE — Patient Instructions (Signed)
You have an appointment with Dr Marcelino Scot on 07/29/2021 at 10:00. Arrival time 09:45  [address] [place], [place] [address] [phone]

## 2021-07-30 ENCOUNTER — Other Ambulatory Visit (HOSPITAL_COMMUNITY): Payer: Self-pay

## 2021-07-30 ENCOUNTER — Telehealth (HOSPITAL_COMMUNITY): Payer: Self-pay

## 2021-07-30 NOTE — Telephone Encounter (Signed)
Transitions of Care Pharmacy  ° °Call attempted for a pharmacy transitions of care follow-up. HIPAA appropriate voicemail was left with call back information provided.  ° °Call attempt #1. Will follow-up in 2-3 days.  °  °

## 2021-07-31 ENCOUNTER — Telehealth (HOSPITAL_COMMUNITY): Payer: Self-pay

## 2021-07-31 NOTE — Telephone Encounter (Signed)
Transitions of Care Pharmacy   Call attempted for a pharmacy transitions of care follow-up. Unable to leave voicemail.   Call attempt #2. Will follow-up in 2-3 days.    

## 2021-08-01 ENCOUNTER — Telehealth (HOSPITAL_COMMUNITY): Payer: Self-pay
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
## 2021-08-02 ENCOUNTER — Telehealth: Payer: Self-pay

## 2021-08-02 NOTE — Telephone Encounter (Signed)
Sharyn Lull, OT called wanting to report a missed visit. Patient canceled due to a HA.

## 2021-08-06 ENCOUNTER — Other Ambulatory Visit: Payer: Self-pay | Admitting: Family Medicine

## 2021-08-06 NOTE — Telephone Encounter (Signed)
Please review and advise, meds pending
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
## 2021-08-12 ENCOUNTER — Other Ambulatory Visit: Payer: Self-pay

## 2021-08-12 ENCOUNTER — Ambulatory Visit (HOSPITAL_COMMUNITY): Payer: 59 | Attending: Orthopedic Surgery | Admitting: Physical Therapy

## 2021-08-12 ENCOUNTER — Encounter (HOSPITAL_COMMUNITY): Payer: Self-pay | Admitting: Physical Therapy

## 2021-08-12 DIAGNOSIS — R2689 Other abnormalities of gait and mobility: Secondary | ICD-10-CM | POA: Diagnosis not present

## 2021-08-12 DIAGNOSIS — R262 Difficulty in walking, not elsewhere classified: Secondary | ICD-10-CM | POA: Diagnosis present

## 2021-08-12 DIAGNOSIS — M6281 Muscle weakness (generalized): Secondary | ICD-10-CM | POA: Diagnosis present

## 2021-08-12 NOTE — Patient Instructions (Signed)
Access Code: [email or web address] URL: [email or web address] Date: 08/12/2021 Prepared by: Josue Hector  Exercises Supine Bridge - 3 x daily - 7 x weekly - 1-2 sets - 10 reps
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
## 2021-08-15 ENCOUNTER — Other Ambulatory Visit: Payer: Self-pay

## 2021-08-15 ENCOUNTER — Encounter (HOSPITAL_COMMUNITY): Payer: Self-pay | Admitting: Physical Therapy

## 2021-08-15 ENCOUNTER — Ambulatory Visit (HOSPITAL_COMMUNITY): Payer: 59 | Admitting: Physical Therapy

## 2021-08-15 DIAGNOSIS — R262 Difficulty in walking, not elsewhere classified: Secondary | ICD-10-CM

## 2021-08-15 DIAGNOSIS — M6281 Muscle weakness (generalized): Secondary | ICD-10-CM

## 2021-08-15 DIAGNOSIS — R2689 Other abnormalities of gait and mobility: Secondary | ICD-10-CM | POA: Diagnosis not present

## 2021-08-15 NOTE — Patient Instructions (Signed)
Access Code: [email or web address] URL: [email or web address] Date: 08/15/2021 Prepared by: Mitzi Hansen Shadavia Dampier  Exercises Lying Prone - 1 x daily - 7 x weekly Active Straight Leg Raise with Quad Set - 1 x daily - 7 x weekly - 4 sets - 5 reps Side to Side Weight Shift with Counter Support - 3 x daily - 7 x weekly - 2 sets - 20 reps Staggered Stance Forward Backward Weight Shift with Counter Support - 3 x daily - 7 x weekly - 2 sets - 20 reps
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
## 2021-08-21 ENCOUNTER — Ambulatory Visit (HOSPITAL_COMMUNITY): Payer: 59 | Attending: Orthopedic Surgery | Admitting: Physical Therapy

## 2021-08-21 ENCOUNTER — Other Ambulatory Visit: Payer: Self-pay

## 2021-08-21 ENCOUNTER — Encounter (HOSPITAL_COMMUNITY): Payer: Self-pay | Admitting: Physical Therapy

## 2021-08-21 DIAGNOSIS — R2689 Other abnormalities of gait and mobility: Secondary | ICD-10-CM | POA: Diagnosis not present

## 2021-08-21 DIAGNOSIS — M6281 Muscle weakness (generalized): Secondary | ICD-10-CM | POA: Diagnosis present

## 2021-08-21 DIAGNOSIS — R262 Difficulty in walking, not elsewhere classified: Secondary | ICD-10-CM | POA: Insufficient documentation

## 2021-08-21 NOTE — Patient Instructions (Signed)
Access Code: [email or web address] URL: [email or web address] Date: 08/21/2021 Prepared by: Mitzi Hansen Aviyon Hocevar  Exercises Seated Long Arc Quad - 3 x daily - 7 x weekly - 10 reps - 5 second hold Forward Step Touch - 3 x daily - 7 x weekly - 3 sets - 5 reps Heel Raises with Counter Support - 3 x daily - 7 x weekly - 2 sets - 10 reps Sit to Stand - 3 x daily - 7 x weekly - 2 sets - 5 reps
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
## 2021-08-23 ENCOUNTER — Ambulatory Visit (HOSPITAL_COMMUNITY): Payer: 59

## 2021-08-23 ENCOUNTER — Other Ambulatory Visit: Payer: Self-pay

## 2021-08-23 ENCOUNTER — Encounter (HOSPITAL_COMMUNITY): Payer: Self-pay

## 2021-08-23 DIAGNOSIS — R2689 Other abnormalities of gait and mobility: Secondary | ICD-10-CM | POA: Diagnosis not present

## 2021-08-23 DIAGNOSIS — M6281 Muscle weakness (generalized): Secondary | ICD-10-CM

## 2021-08-23 DIAGNOSIS — R262 Difficulty in walking, not elsewhere classified: Secondary | ICD-10-CM
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
## 2021-08-27 ENCOUNTER — Ambulatory Visit (HOSPITAL_COMMUNITY): Payer: 59 | Admitting: Physical Therapy

## 2021-08-27 ENCOUNTER — Encounter (HOSPITAL_COMMUNITY): Payer: Self-pay | Admitting: Physical Therapy

## 2021-08-27 ENCOUNTER — Other Ambulatory Visit: Payer: Self-pay

## 2021-08-27 DIAGNOSIS — R2689 Other abnormalities of gait and mobility: Secondary | ICD-10-CM | POA: Diagnosis not present

## 2021-08-27 DIAGNOSIS — R262 Difficulty in walking, not elsewhere classified: Secondary | ICD-10-CM

## 2021-08-27 DIAGNOSIS — M6281 Muscle weakness (generalized): Secondary | ICD-10-CM

## 2021-08-27 NOTE — Patient Instructions (Signed)
Access Code: [email or web address] URL: [email or web address] Date: 08/27/2021 Prepared by: Josue Hector  Exercises Standing Hip Abduction with Counter Support - 2-3 x daily - 7 x weekly - 1-2 sets - 10 reps
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
## 2021-08-29 ENCOUNTER — Ambulatory Visit (HOSPITAL_COMMUNITY): Payer: 59 | Admitting: Physical Therapy

## 2021-08-29 ENCOUNTER — Other Ambulatory Visit: Payer: Self-pay

## 2021-08-29 DIAGNOSIS — M6281 Muscle weakness (generalized): Secondary | ICD-10-CM

## 2021-08-29 DIAGNOSIS — R262 Difficulty in walking, not elsewhere classified: Secondary | ICD-10-CM

## 2021-08-29 DIAGNOSIS — R2689 Other abnormalities of gait and mobility: Secondary | ICD-10-CM | POA: Diagnosis not present
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
## 2021-08-30 ENCOUNTER — Other Ambulatory Visit: Payer: Self-pay | Admitting: Family Medicine

## 2021-08-30 NOTE — Telephone Encounter (Signed)
Clarification needed from pharmacy for prescription MME;cannot be calculated for this prescription. Enter discrete sig details to calculate prescription MME  Please review and advise, med pending

## 2021-09-02 NOTE — Telephone Encounter (Signed)
Pls call pt and see if she is about to run out of this med. I put a call in to her pharmacy 3 d/a to see why they did not fill it but they were going to check on it and get back with me and I haven't heard from them.

## 2021-09-02 NOTE — Telephone Encounter (Signed)
Maybe she wants me to send it to a different pharmacy??

## 2021-09-03 ENCOUNTER — Other Ambulatory Visit: Payer: Self-pay

## 2021-09-03 ENCOUNTER — Ambulatory Visit (HOSPITAL_COMMUNITY): Payer: 59 | Admitting: Physical Therapy

## 2021-09-03 DIAGNOSIS — M6281 Muscle weakness (generalized): Secondary | ICD-10-CM

## 2021-09-03 DIAGNOSIS — R2689 Other abnormalities of gait and mobility: Secondary | ICD-10-CM

## 2021-09-03 DIAGNOSIS — R262 Difficulty in walking, not elsewhere classified: Secondary | ICD-10-CM
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
## 2021-09-03 NOTE — Telephone Encounter (Signed)
Spoke with pt, she has qty of 36 left which will last 9 days. She will call when needs refill and request through local [facility]. No refill needed at this time. Please deny

## 2021-09-05 ENCOUNTER — Encounter (HOSPITAL_COMMUNITY): Payer: 59 | Admitting: Physical Therapy

## 2021-09-10 ENCOUNTER — Ambulatory Visit (HOSPITAL_COMMUNITY): Payer: 59 | Admitting: Physical Therapy

## 2021-09-10 ENCOUNTER — Encounter (HOSPITAL_COMMUNITY): Payer: Self-pay | Admitting: Physical Therapy

## 2021-09-10 ENCOUNTER — Other Ambulatory Visit: Payer: Self-pay

## 2021-09-10 DIAGNOSIS — R2689 Other abnormalities of gait and mobility: Secondary | ICD-10-CM

## 2021-09-10 DIAGNOSIS — R262 Difficulty in walking, not elsewhere classified: Secondary | ICD-10-CM

## 2021-09-10 DIAGNOSIS — M6281 Muscle weakness (generalized): Secondary | ICD-10-CM
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
## 2021-09-12 ENCOUNTER — Other Ambulatory Visit: Payer: Self-pay

## 2021-09-12 ENCOUNTER — Encounter (HOSPITAL_COMMUNITY): Payer: Self-pay | Admitting: Physical Therapy

## 2021-09-12 ENCOUNTER — Ambulatory Visit (HOSPITAL_COMMUNITY): Payer: 59 | Admitting: Physical Therapy

## 2021-09-12 DIAGNOSIS — R262 Difficulty in walking, not elsewhere classified: Secondary | ICD-10-CM

## 2021-09-12 DIAGNOSIS — R2689 Other abnormalities of gait and mobility: Secondary | ICD-10-CM | POA: Diagnosis not present

## 2021-09-12 DIAGNOSIS — M6281 Muscle weakness (generalized): Secondary | ICD-10-CM
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
## 2021-09-17 ENCOUNTER — Other Ambulatory Visit: Payer: Self-pay

## 2021-09-17 ENCOUNTER — Encounter (HOSPITAL_COMMUNITY): Payer: Self-pay | Admitting: Physical Therapy

## 2021-09-17 ENCOUNTER — Ambulatory Visit (HOSPITAL_COMMUNITY): Payer: 59 | Attending: Orthopedic Surgery | Admitting: Physical Therapy

## 2021-09-17 DIAGNOSIS — R2689 Other abnormalities of gait and mobility: Secondary | ICD-10-CM | POA: Diagnosis not present

## 2021-09-17 DIAGNOSIS — M6281 Muscle weakness (generalized): Secondary | ICD-10-CM | POA: Insufficient documentation

## 2021-09-17 DIAGNOSIS — R262 Difficulty in walking, not elsewhere classified: Secondary | ICD-10-CM | POA: Diagnosis present
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
## 2021-09-18 ENCOUNTER — Encounter: Payer: Self-pay | Admitting: Physical Medicine & Rehabilitation

## 2021-09-18 ENCOUNTER — Encounter: Payer: 59 | Attending: Registered Nurse | Admitting: Physical Medicine & Rehabilitation

## 2021-09-18 ENCOUNTER — Ambulatory Visit
Admission: RE | Admit: 2021-09-18 | Discharge: 2021-09-18 | Disposition: A | Discharge status: Home / Self Care | Payer: 59 | Source: Ambulatory Visit | Attending: Physical Medicine & Rehabilitation | Admitting: Physical Medicine & Rehabilitation

## 2021-09-18 VITALS — BP 126/87 | HR 73 | Temp 98.0°F | Ht 68.0 in | Wt 160.0 lb

## 2021-09-18 DIAGNOSIS — F4310 Post-traumatic stress disorder, unspecified: Secondary | ICD-10-CM

## 2021-09-18 DIAGNOSIS — M79671 Pain in right foot: Secondary | ICD-10-CM | POA: Diagnosis present

## 2021-09-18 DIAGNOSIS — S069X1S Unspecified intracranial injury with loss of consciousness of 30 minutes or less, sequela: Secondary | ICD-10-CM

## 2021-09-18 DIAGNOSIS — G479 Sleep disorder, unspecified: Secondary | ICD-10-CM

## 2021-09-18 DIAGNOSIS — H532 Diplopia: Secondary | ICD-10-CM | POA: Diagnosis not present

## 2021-09-18 HISTORY — DX: Sleep disorder, unspecified: G47.9

## 2021-09-18 MED ORDER — TRAZODONE HCL 100 MG PO TABS: 100.0000 mg | ORAL_TABLET | Freq: Every day | ORAL | 3 refills | Status: DC

## 2021-09-18 NOTE — Progress Notes (Signed)
Subjective:    Patient ID: Melissa Harrington, female    DOB: [birth year], 50 y.o.   MRN: [id number]  HPI  Melissa Harrington is here in follow up of her polytrauma and TBI.  She is working with PT on gait and walked 145+ ft with quad cane yesterday. She is using her RW at home. She used a w/c to get into the office from her car.   She is pretty independent with her household activitites. She still needs some set up in shower. She is preparing simple meals by herself  Her pain levels are improving. She felt a 'pop' when she was walking in therapy a few days ago, and the foot has been bothering her more since then. There's a "knot" that has been there which has gone down a bit as has the pain. She did suffer a non-displaced fx of the base of the 5th MT from the accident.  Her right hip/knee  also is sore quite often. Apparently she developed some HO in the hip.  From a cognitive standpoint she has mild concentration and focus issues. Words can be difficult to find at times.   She reports continued diplopia with downward vision. This can be problematic with reading as well as with depth perception during gait.   Emotionally, she's doing fairly well. She has a supportive family which has all endured some sorrow during this period of adjustment. Sleep sometimes is a problem due to difficulty getting comfortable at night.. PCP prescribed xanax which has helped somewhat. She remains on melatonin and trazodone 50mg  for sleep   Pain Inventory Average Pain 3 Pain Right Now 2 My pain is dull and aching  In the last 24 hours, has pain interfered with the following? General activity 2 Relation with others 2 Enjoyment of life 2 What TIME of day is your pain at its worst? morning  and evening Sleep (in general) Fair  Pain is worse with: walking, bending, and standing Pain improves with: rest, heat/ice, and medication Relief from Meds: 7  Family History  Problem Relation Age of Onset   Alcohol abuse  Father    Hypertension Father    Cancer Father    Alcohol abuse Maternal Grandmother    Alcohol abuse Maternal Grandfather    Alcohol abuse Paternal Grandmother    Alcohol abuse Paternal Grandfather    Lung cancer Mother 63       Former smoker   Mental illness Father    Heart disease Maternal Grandmother    Allergic rhinitis Neg Hx    Asthma Neg Hx    Eczema Neg Hx    Urticaria Neg Hx    Social History   Socioeconomic History   Marital status: Married    Spouse name: Not on file   Number of children: Not on file   Years of education: Not on file   Highest education level: Not on file  Occupational History   Not on file  Tobacco Use   Smoking status: Never   Smokeless tobacco: Never  Vaping Use   Vaping Use: Never used  Substance and Sexual Activity   Alcohol use: Yes    Alcohol/week: 2.0 standard drinks    Types: 2 Glasses of wine per week    Comment: occ   Drug use: No   Sexual activity: Yes    Birth control/protection: Surgical  Other Topics Concern   Not on file  Social History Narrative   ** Merged History Encounter **  Married, 3 children. Educ: BA from [employer] Occup: homemaker   Social Determinants of Health   Financial Resource Strain: Low Risk    Difficulty of Paying Living Expenses: Not hard at all  Food Insecurity: No Food Insecurity   Worried About Charity fundraiser in the Last Year: Never true   Arboriculturist in the Last Year: Never true  Transportation Needs: No Transportation Needs   Lack of Transportation (Medical): No   Lack of Transportation (Non-Medical): No  Physical Activity: Inactive   Days of Exercise per Week: 0 days   Minutes of Exercise per Session: 20 min  Stress: Stress Concern Present   Feeling of Stress : Rather much  Social Connections: Socially Integrated   Frequency of Communication with Friends and Family: More than three times a week   Frequency of Social Gatherings with Friends and Family: Once a week    Attends Religious Services: More than 4 times per year   Active Member of Clubs or Organizations: No   Attends Archivist Meetings: 1 to 4 times per year   Marital Status: Married   Past Surgical History:  Procedure Laterality Date   CAST APPLICATION Left 07/18/4781   Procedure: CAST APPLICATION;  Surgeon: Milly Jakob, MD;  Location: [place];  Service: Orthopedics;  Laterality: Left;   CESAREAN SECTION     3   CLOSED REDUCTION HUMERUS FRACTURE Left 05/25/2021   Procedure: CLOSED REDUCTION HUMERAL SHAFT;  Surgeon: Milly Jakob, MD;  Location: [place];  Service: Orthopedics;  Laterality: Left;   CLOSED REDUCTION RADIAL SHAFT Left 05/25/2021   Procedure: CLOSED REDUCTION RADIUS  AND ULNAR FRACTURE;  Surgeon: Milly Jakob, MD;  Location: [place];  Service: Orthopedics;  Laterality: Left;   COLONOSCOPY  11/19/2020   adenoma x 1.  Recall 5 yrs.   COLONOSCOPY WITH PROPOFOL N/A 11/19/2020   Procedure: COLONOSCOPY WITH PROPOFOL;  Surgeon: Eloise Harman, DO;  [facility];  Service: Endoscopy;  Laterality: N/A;  8:45 ASA II   ECTOPIC PREGNANCY SURGERY     four surgeries (left fallopian tube removed)   EXTERNAL FIXATION LEG Bilateral 05/25/2021   Procedure: EXTERNAL FIXATION LEG;  Surgeon: Erle Crocker, MD;  Location: [place];  Service: Orthopedics;  Laterality: Bilateral;   FEMUR IM NAIL Left 05/27/2021   Procedure: INTRAMEDULLARY (IM) NAIL FEMORAL;  Surgeon: Altamese Verona, MD;  Location: [place];  Service: Orthopedics;  Laterality: Left;   I & D EXTREMITY Bilateral 05/25/2021   Procedure: IRRIGATION AND DEBRIDEMENT EXTREMITY;  Surgeon: Erle Crocker, MD;  Location: [place];  Service: Orthopedics;  Laterality: Bilateral;   I & D EXTREMITY Left 05/25/2021   Procedure: IRRIGATION AND DEBRIDEMENT EXTREMITY;  Surgeon: Milly Jakob, MD;  Location: [place];  Service: Orthopedics;  Laterality: Left;   INSERTION OF TRACTION PIN Right 05/25/2021   Procedure: INSERTION OF TRACTION  PIN;  Surgeon: Erle Crocker, MD;  Location: [place];  Service: Orthopedics;  Laterality: Right;   LACERATION REPAIR Bilateral 05/25/2021   Procedure: REPAIR MULTIPLE LACERATIONS;  Surgeon: Erle Crocker, MD;  Location: [place];  Service: Orthopedics;  Laterality: Bilateral;   ORIF ACETABULAR FRACTURE Right 06/04/2021   Procedure: OPEN REDUCTION INTERNAL FIXATION (ORIF) RIGHT ACETABULAR FRACTURE;  Surgeon: Altamese [place], MD;  Location: [place];  Service: Orthopedics;  Laterality: Right;   ORIF FEMUR FRACTURE Right 05/27/2021   Procedure: OPEN REDUCTION INTERNAL FIXATION (ORIF) DISTAL FEMUR FRACTURE;  Surgeon: Altamese [place], MD;  Location:  [facility];  Service: Orthopedics;  Laterality: Right;   ORIF HUMERUS FRACTURE Left 06/04/2021   Procedure: OPEN REDUCTION INTERNAL FIXATION (ORIF) LEFT DISTAL HUMERUS FRACTURE;  Surgeon: Altamese [place]ma, MD;  Location: [place];  Service: Orthopedics;  Laterality: Left;   ORIF RADIAL FRACTURE Left 06/04/2021   Procedure: OPEN REDUCTION INTERNAL FIXATION (ORIF) LEFT FOREARM;  Surgeon: Altamese [place], MD;  Location: [place];  Service: Orthopedics;  Laterality: Left;   POLYPECTOMY  11/19/2020   Procedure: POLYPECTOMY;  Surgeon: Eloise Harman, DO;  [facility];  Service: Endoscopy;;   SKIN CANCER EXCISION  2007   on pt's back    Past Surgical History:  Procedure Laterality Date   CAST APPLICATION Left 02/21/6545   Procedure: CAST APPLICATION;  Surgeon: Milly Jakob, MD;  Location: [place];  Service: Orthopedics;  Laterality: Left;   CESAREAN SECTION     3   CLOSED REDUCTION HUMERUS FRACTURE Left 05/25/2021   Procedure: CLOSED REDUCTION HUMERAL SHAFT;  Surgeon: Milly Jakob, MD;  Location: [place];  Service: Orthopedics;  Laterality: Left;   CLOSED REDUCTION RADIAL SHAFT Left 05/25/2021   Procedure: CLOSED REDUCTION RADIUS  AND ULNAR FRACTURE;  Surgeon: Milly Jakob, MD;  Location: [place];  Service: Orthopedics;  Laterality: Left;   COLONOSCOPY   11/19/2020   adenoma x 1.  Recall 5 yrs.   COLONOSCOPY WITH PROPOFOL N/A 11/19/2020   Procedure: COLONOSCOPY WITH PROPOFOL;  Surgeon: Eloise Harman, DO;  [facility];  Service: Endoscopy;  Laterality: N/A;  8:45 ASA II   ECTOPIC PREGNANCY SURGERY     four surgeries (left fallopian tube removed)   EXTERNAL FIXATION LEG Bilateral 05/25/2021   Procedure: EXTERNAL FIXATION LEG;  Surgeon: Erle Crocker, MD;  Location: [place];  Service: Orthopedics;  Laterality: Bilateral;   FEMUR IM NAIL Left 05/27/2021   Procedure: INTRAMEDULLARY (IM) NAIL FEMORAL;  Surgeon: Altamese [place], MD;  Location: [place];  Service: Orthopedics;  Laterality: Left;   I & D EXTREMITY Bilateral 05/25/2021   Procedure: IRRIGATION AND DEBRIDEMENT EXTREMITY;  Surgeon: Erle Crocker, MD;  Location: [place];  Service: Orthopedics;  Laterality: Bilateral;   I & D EXTREMITY Left 05/25/2021   Procedure: IRRIGATION AND DEBRIDEMENT EXTREMITY;  Surgeon: Milly Jakob, MD;  Location: [place];  Service: Orthopedics;  Laterality: Left;   INSERTION OF TRACTION PIN Right 05/25/2021   Procedure: INSERTION OF TRACTION PIN;  Surgeon: Erle Crocker, MD;  Location: [place];  Service: Orthopedics;  Laterality: Right;   LACERATION REPAIR Bilateral 05/25/2021   Procedure: REPAIR MULTIPLE LACERATIONS;  Surgeon: Erle Crocker, MD;  Location: [place];  Service: Orthopedics;  Laterality: Bilateral;   ORIF ACETABULAR FRACTURE Right 06/04/2021   Procedure: OPEN REDUCTION INTERNAL FIXATION (ORIF) RIGHT ACETABULAR FRACTURE;  Surgeon: Altamese [place], MD;  Location: [place];  Service: Orthopedics;  Laterality: Right;   ORIF FEMUR FRACTURE Right 05/27/2021   Procedure: OPEN REDUCTION INTERNAL FIXATION (ORIF) DISTAL FEMUR FRACTURE;  Surgeon: Altamese [place], MD;  Location: [place];  Service: Orthopedics;  Laterality: Right;   ORIF HUMERUS FRACTURE Left 06/04/2021   Procedure: OPEN REDUCTION INTERNAL FIXATION (ORIF) LEFT DISTAL HUMERUS FRACTURE;   Surgeon: Altamese [place], MD;  Location: [place];  Service: Orthopedics;  Laterality: Left;   ORIF RADIAL FRACTURE Left 06/04/2021   Procedure: OPEN REDUCTION INTERNAL FIXATION (ORIF) LEFT FOREARM;  Surgeon: Altamese [place], MD;  Location: [place];  Service: Orthopedics;  Laterality: Left;   POLYPECTOMY  11/19/2020   Procedure:  POLYPECTOMY;  Surgeon: Eloise Harman, DO;  [facility];  Service: Endoscopy;;   SKIN CANCER EXCISION  2007   on pt's back    Past Medical History:  Diagnosis Date   Allergy to alpha-gal 03/2019   Dr. Verlin Fester   Allergy to alpha-gal    Anxiety    + hx of panic attacks.  Was on lexapro for a short time in remote past.   Anxiety    Dyspareunia 10/10/2013   Endometrial polyp 10/17/2013   Essential hypertension    Hay fever    Hemorrhoids 10/10/2013   History of melanoma    HTN (hypertension)    Melanoma ([place])    Menometrorrhagia 2013   using herbal treatments and this has resolved.   Other and unspecified ovarian cyst 10/17/2013   PONV (postoperative nausea and vomiting)    BP 126/87   Pulse 73   Temp 98 F (36.7 C) (Oral)   Ht 5\' 8"  (1.727 m)   Wt 160 lb (72.6 kg)   SpO2 98%   BMI 24.33 kg/m   Opioid Risk Score:   Fall Risk Score:  `1  Depression screen PHQ 2/9  Depression screen [facility] 2/9 07/24/2021 10/04/2020 03/03/2019 02/23/2017  Decreased Interest 0 1 0 0  Down, Depressed, Hopeless 0 1 0 0  PHQ - 2 Score 0 2 0 0  Altered sleeping 0 3 3 -  Tired, decreased energy 1 3 1  -  Change in appetite 1 3 0 -  Feeling bad or failure about yourself  0 0 0 -  Trouble concentrating 0 0 1 -  Moving slowly or fidgety/restless 0 0 0 -  Suicidal thoughts 0 0 0 -  PHQ-9 Score 2 11 5  -  Difficult doing work/chores - - Somewhat difficult -     Review of Systems  Musculoskeletal:        Left hip, knee, ankle and foot pain  All other systems reviewed and are negative.     Objective:   Physical Exam Gen: no distress, normal appearing HEENT: oral  mucosa pink and moist, NCAT Cardio: Reg rate Chest: normal effort, normal rate of breathing Abd: soft, non-distended Ext: no edema Psych: pleasant, normal affect Skin: intact Neuro: Patient is alert and oriented x3.  Demonstrates normal insight and awareness.  Memory is functional.  Do not see substantial concentration deficits.  She carried on a normal carries conversation.  On examination of her cranial nerves I saw no gross abnormalities in her extraocular eye movements.  She did complain of diplopia with downward vision and with tracking while in a downward position.  Strength grossly 4-5 out of 5 in all 4 limbs with some pain in the vision particularly in the right lower extremity.  Might been some mild right lower limb sensory loss as well. Musculoskeletal: Patient with right hip and knee pain during passive range of motion but this was fairly minimal.  There was some pain with palpation at the base of the right fifth metatarsal.  She has some pain with weightbearing as well but pain was not severe.  There was no associated swelling.       Assessment & Plan:   1.  Functional and mobility deficits secondary to TBI with polytrauma Continue with outpatient therapies.  She has made very nice gains! -She is not ready to return to work at this time.  She works as a Education officer, museum.  Barriers to returning to work will center around her physical  tolerance from a mobility standpoint as well as her capacity to maintain concentration and focus during a typical school day. 2.  Antithrombotics: -DVT/anticoagulation:   Eliquis             3. Pain Management: Tylenol as needed 4. Mood/sleep:                -Increase melatonin to 6 mg and trazodone to 100 mg             -Prefer use of Xanax only as needed for anxiety  -Patient doing fairly well from a mood standpoint.  Has supportive family  -Continue prazosin for now 5  Left humerus Fx and Left BBFx s/p ORIF: Per Ortho  6 Right acetabular /Femur Fx  s/p ORIF: Per Ortho  7. Open left femur Fx s/p ORIF, left acetab fx and SPR/IPR fx's: Per Ortho  8. L-open BBFA Fx s/p ORIF:   9.  Right fifth metatarsal fracture with recent "pop" during gait.  -We will recheck x-rays of the foot today.  -Continue weightbearing for now 10.  Persistent diplopia, questionable cranial nerve IV injury  -No significant improvement since leaving the hospital.  Will refer patient to Dr. Gevena Cotton for assessment  Thirty minutes of face to face patient care time were spent during this visit. All questions were encouraged and answered. Follow up with me in 2 months.,

## 2021-09-18 NOTE — Patient Instructions (Signed)
PLEASE FEEL FREE TO CALL OUR OFFICE WITH ANY PROBLEMS OR QUESTIONS ([phone])      

## 2021-09-19 ENCOUNTER — Other Ambulatory Visit: Payer: Self-pay | Admitting: Orthopedic Surgery

## 2021-09-19 ENCOUNTER — Ambulatory Visit (HOSPITAL_COMMUNITY): Payer: 59 | Admitting: Physical Therapy

## 2021-09-19 DIAGNOSIS — M79671 Pain in right foot: Secondary | ICD-10-CM

## 2021-09-24 ENCOUNTER — Ambulatory Visit (HOSPITAL_COMMUNITY): Payer: 59 | Admitting: Physical Therapy

## 2021-09-24 ENCOUNTER — Encounter (HOSPITAL_COMMUNITY): Payer: Self-pay | Admitting: Physical Therapy

## 2021-09-24 ENCOUNTER — Other Ambulatory Visit: Payer: Self-pay

## 2021-09-24 DIAGNOSIS — R2689 Other abnormalities of gait and mobility: Secondary | ICD-10-CM | POA: Diagnosis not present

## 2021-09-24 DIAGNOSIS — M6281 Muscle weakness (generalized): Secondary | ICD-10-CM

## 2021-09-24 DIAGNOSIS — R262 Difficulty in walking, not elsewhere classified: Secondary | ICD-10-CM
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
## 2021-09-26 ENCOUNTER — Other Ambulatory Visit: Payer: Self-pay

## 2021-09-26 ENCOUNTER — Ambulatory Visit (HOSPITAL_COMMUNITY): Payer: 59 | Attending: Orthopedic Surgery | Admitting: Physical Therapy

## 2021-09-26 ENCOUNTER — Encounter (HOSPITAL_COMMUNITY): Payer: Self-pay | Admitting: Physical Therapy

## 2021-09-26 DIAGNOSIS — R262 Difficulty in walking, not elsewhere classified: Secondary | ICD-10-CM | POA: Insufficient documentation

## 2021-09-26 DIAGNOSIS — R2689 Other abnormalities of gait and mobility: Secondary | ICD-10-CM | POA: Diagnosis present

## 2021-09-26 DIAGNOSIS — M6281 Muscle weakness (generalized): Secondary | ICD-10-CM | POA: Diagnosis present
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
## 2021-10-01 ENCOUNTER — Other Ambulatory Visit: Payer: Self-pay

## 2021-10-01 ENCOUNTER — Encounter (HOSPITAL_COMMUNITY): Payer: Self-pay | Admitting: Physical Therapy

## 2021-10-01 ENCOUNTER — Ambulatory Visit (HOSPITAL_COMMUNITY): Payer: 59 | Admitting: Physical Therapy

## 2021-10-01 DIAGNOSIS — R2689 Other abnormalities of gait and mobility: Secondary | ICD-10-CM

## 2021-10-01 DIAGNOSIS — R262 Difficulty in walking, not elsewhere classified: Secondary | ICD-10-CM

## 2021-10-01 DIAGNOSIS — M6281 Muscle weakness (generalized): Secondary | ICD-10-CM
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
## 2021-10-03 ENCOUNTER — Ambulatory Visit (HOSPITAL_COMMUNITY): Payer: 59 | Admitting: Physical Therapy

## 2021-10-07 ENCOUNTER — Ambulatory Visit (HOSPITAL_COMMUNITY): Payer: 59 | Admitting: Physical Therapy

## 2021-10-07 ENCOUNTER — Encounter (HOSPITAL_COMMUNITY): Payer: Self-pay | Admitting: Physical Therapy

## 2021-10-07 ENCOUNTER — Other Ambulatory Visit: Payer: Self-pay

## 2021-10-07 ENCOUNTER — Ambulatory Visit
Admission: RE | Admit: 2021-10-07 | Discharge: 2021-10-07 | Disposition: A | Discharge status: Home / Self Care | Payer: 59 | Source: Ambulatory Visit | Attending: Orthopedic Surgery | Admitting: Orthopedic Surgery

## 2021-10-07 DIAGNOSIS — R2689 Other abnormalities of gait and mobility: Secondary | ICD-10-CM

## 2021-10-07 DIAGNOSIS — R262 Difficulty in walking, not elsewhere classified: Secondary | ICD-10-CM

## 2021-10-07 DIAGNOSIS — M6281 Muscle weakness (generalized): Secondary | ICD-10-CM

## 2021-10-07 DIAGNOSIS — M79671 Pain in right foot: Secondary | ICD-10-CM
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
## 2021-10-09 ENCOUNTER — Other Ambulatory Visit: Payer: Self-pay

## 2021-10-09 ENCOUNTER — Encounter (HOSPITAL_COMMUNITY): Payer: Self-pay | Admitting: Physical Therapy

## 2021-10-09 ENCOUNTER — Ambulatory Visit (HOSPITAL_COMMUNITY): Payer: 59 | Admitting: Physical Therapy

## 2021-10-09 DIAGNOSIS — R2689 Other abnormalities of gait and mobility: Secondary | ICD-10-CM | POA: Diagnosis not present

## 2021-10-09 DIAGNOSIS — R262 Difficulty in walking, not elsewhere classified: Secondary | ICD-10-CM

## 2021-10-09 DIAGNOSIS — M6281 Muscle weakness (generalized): Secondary | ICD-10-CM

## 2021-10-09 NOTE — Patient Instructions (Signed)
Access Code: [facility] URL: [email or web address] Date: 10/09/2021 Prepared by: Josue Hector  Exercises Standing Knee Flexion AROM with Chair Support - 2-3 x daily - 7 x weekly - 2 sets - 10 reps Standing Single Leg Stance with Counter Support - 2-3 x daily - 7 x weekly - 1 sets - 10 reps - 15-20 second hold
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
## 2021-10-15 ENCOUNTER — Other Ambulatory Visit: Payer: Self-pay

## 2021-10-15 ENCOUNTER — Encounter (HOSPITAL_COMMUNITY): Payer: Self-pay | Admitting: Physical Therapy

## 2021-10-15 ENCOUNTER — Ambulatory Visit (HOSPITAL_COMMUNITY): Payer: 59 | Attending: Orthopedic Surgery | Admitting: Physical Therapy

## 2021-10-15 DIAGNOSIS — R2689 Other abnormalities of gait and mobility: Secondary | ICD-10-CM | POA: Insufficient documentation

## 2021-10-15 DIAGNOSIS — M6281 Muscle weakness (generalized): Secondary | ICD-10-CM | POA: Insufficient documentation

## 2021-10-15 DIAGNOSIS — R262 Difficulty in walking, not elsewhere classified: Secondary | ICD-10-CM | POA: Diagnosis present
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
## 2021-10-17 ENCOUNTER — Ambulatory Visit (HOSPITAL_COMMUNITY): Payer: 59 | Attending: Orthopedic Surgery | Admitting: Physical Therapy

## 2021-10-17 ENCOUNTER — Other Ambulatory Visit: Payer: Self-pay

## 2021-10-17 DIAGNOSIS — R2689 Other abnormalities of gait and mobility: Secondary | ICD-10-CM | POA: Insufficient documentation

## 2021-10-17 DIAGNOSIS — R262 Difficulty in walking, not elsewhere classified: Secondary | ICD-10-CM | POA: Diagnosis present

## 2021-10-17 DIAGNOSIS — M6281 Muscle weakness (generalized): Secondary | ICD-10-CM | POA: Diagnosis present
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
## 2021-10-22 ENCOUNTER — Ambulatory Visit (HOSPITAL_COMMUNITY): Payer: 59 | Admitting: Physical Therapy

## 2021-10-22 ENCOUNTER — Other Ambulatory Visit: Payer: Self-pay

## 2021-10-22 ENCOUNTER — Encounter (HOSPITAL_COMMUNITY): Payer: Self-pay | Admitting: Physical Therapy

## 2021-10-22 DIAGNOSIS — R2689 Other abnormalities of gait and mobility: Secondary | ICD-10-CM | POA: Diagnosis not present

## 2021-10-22 DIAGNOSIS — M6281 Muscle weakness (generalized): Secondary | ICD-10-CM

## 2021-10-22 DIAGNOSIS — R262 Difficulty in walking, not elsewhere classified: Secondary | ICD-10-CM
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
## 2021-11-05 ENCOUNTER — Other Ambulatory Visit: Payer: Self-pay

## 2021-11-05 ENCOUNTER — Ambulatory Visit (HOSPITAL_COMMUNITY): Payer: 59 | Admitting: Physical Therapy

## 2021-11-05 DIAGNOSIS — R262 Difficulty in walking, not elsewhere classified: Secondary | ICD-10-CM

## 2021-11-05 DIAGNOSIS — R2689 Other abnormalities of gait and mobility: Secondary | ICD-10-CM

## 2021-11-05 DIAGNOSIS — M6281 Muscle weakness (generalized): Secondary | ICD-10-CM

## 2021-11-05 NOTE — Patient Instructions (Addendum)
Access Code: [email or web address] URL: [email or web address] Date: 11/05/2021 Prepared by: Josue Hector  Exercises Supine Bridge - 1 x daily - 3-4 x weekly - 2 sets - 10 reps Supine Active Straight Leg Raise - 1 x daily - 3-4 x weekly - 2 sets - 10 reps Sidelying Hip Abduction - 1 x daily - 3-4 x weekly - 2 sets - 10 reps Supine Heel Slide with Strap - 1 x daily - 3-4 x weekly - 1 sets - 10 reps - 5 second hold  Access Code: [email or web address] URL: [email or web address] Date: 11/05/2021 Prepared by: Josue Hector  Exercises Mini Squat with Counter Support - 1 x daily - 3-4 x weekly - 2 sets - 10 reps Hip Abduction with Resistance Loop - 1 x daily - 3-4 x weekly - 2 sets - 10 reps Standing Hamstring Curl with Resistance - 1 x daily - 3-4 x weekly - 2 sets - 10 reps Hip Extension with Resistance Loop - 1 x daily - 3-4 x weekly - 2 sets - 10 reps Tandem Stance with Support - 1 x daily - 3-4 x weekly - 1 sets - 3 reps - 30 seconds hold
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
## 2021-11-07 ENCOUNTER — Ambulatory Visit (HOSPITAL_COMMUNITY): Payer: 59 | Admitting: Physical Therapy

## 2021-11-12 ENCOUNTER — Ambulatory Visit (HOSPITAL_COMMUNITY): Payer: 59 | Admitting: Physical Therapy

## 2021-11-14 ENCOUNTER — Ambulatory Visit (HOSPITAL_COMMUNITY): Payer: 59 | Admitting: Physical Therapy

## 2021-11-21 ENCOUNTER — Ambulatory Visit: Payer: 59 | Admitting: Family Medicine

## 2021-11-21 ENCOUNTER — Encounter: Payer: Self-pay | Admitting: Family Medicine

## 2021-11-21 ENCOUNTER — Other Ambulatory Visit: Payer: Self-pay

## 2021-11-21 VITALS — BP 123/82 | HR 76 | Temp 97.7°F | Ht 68.0 in | Wt 160.8 lb

## 2021-11-21 DIAGNOSIS — E778 Other disorders of glycoprotein metabolism: Secondary | ICD-10-CM

## 2021-11-21 DIAGNOSIS — G479 Sleep disorder, unspecified: Secondary | ICD-10-CM

## 2021-11-21 DIAGNOSIS — R748 Abnormal levels of other serum enzymes: Secondary | ICD-10-CM | POA: Diagnosis not present

## 2021-11-21 DIAGNOSIS — Z7901 Long term (current) use of anticoagulants: Secondary | ICD-10-CM | POA: Diagnosis not present

## 2021-11-21 DIAGNOSIS — F4310 Post-traumatic stress disorder, unspecified: Secondary | ICD-10-CM

## 2021-11-21 DIAGNOSIS — D62 Acute posthemorrhagic anemia: Secondary | ICD-10-CM | POA: Diagnosis not present

## 2021-11-21 DIAGNOSIS — I2699 Other pulmonary embolism without acute cor pulmonale: Secondary | ICD-10-CM | POA: Diagnosis not present

## 2021-11-21 DIAGNOSIS — I10 Essential (primary) hypertension: Secondary | ICD-10-CM

## 2021-11-21 MED ORDER — METHOCARBAMOL 500 MG PO TABS: ORAL_TABLET | ORAL | 3 refills | Status: AC

## 2021-11-21 MED ORDER — METOPROLOL TARTRATE 50 MG PO TABS
ORAL_TABLET | ORAL | 3 refills | Status: DC
Start: 2021-11-21 — End: 2022-11-27

## 2021-11-21 MED ORDER — PRAZOSIN HCL 1 MG PO CAPS: ORAL_CAPSULE | ORAL | 3 refills | Status: DC

## 2021-11-21 MED ORDER — TRAMADOL HCL 50 MG PO TABS: ORAL_TABLET | ORAL | 2 refills | Status: AC

## 2021-11-21 MED ORDER — TRAZODONE HCL 100 MG PO TABS: 100.0000 mg | ORAL_TABLET | Freq: Every day | ORAL | 3 refills | Status: DC
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
## 2021-11-22 LAB — COMPREHENSIVE METABOLIC PANEL
ALT: 19 U/L (ref 0–35)
AST: 19 U/L (ref 0–37)
Albumin: 4.7 g/dL (ref 3.5–5.2)
Alkaline Phosphatase: 184 U/L — ABNORMAL HIGH (ref 39–117)
BUN: 11 mg/dL (ref 6–23)
CO2: 28 mEq/L (ref 19–32)
Calcium: 10.3 mg/dL (ref 8.4–10.5)
Chloride: 101 mEq/L (ref 96–112)
Creatinine, Ser: 0.77 mg/dL (ref 0.40–1.20)
GFR: 89.29 mL/min (ref >60.00)
Glucose, Bld: 114 mg/dL — ABNORMAL HIGH (ref 70–99)
Potassium: 5 mEq/L (ref 3.5–5.1)
Sodium: 139 mEq/L (ref 135–145)
Total Bilirubin: 0.4 mg/dL (ref 0.2–1.2)
Total Protein: 7.9 g/dL (ref 6.0–8.3)

## 2021-11-22 LAB — IRON,TIBC AND FERRITIN PANEL
%SAT: 18 % (calc) (ref 16–45)
Ferritin: 221 ng/mL (ref 16–232)
Iron: 68 ug/dL (ref 45–160)
TIBC: 369 mcg/dL (calc) (ref 250–450)

## 2021-11-22 LAB — CBC
HCT: 45.6 % (ref 36.0–46.0)
Hemoglobin: 15.1 g/dL — ABNORMAL HIGH (ref 12.0–15.0)
MCHC: 33 g/dL (ref 30.0–36.0)
MCV: 91.6 fl (ref 78.0–100.0)
Platelets: 280 10*3/uL (ref 150.0–400.0)
RBC: 4.98 Mil/uL (ref 3.87–5.11)
RDW: 16.5 % — ABNORMAL HIGH (ref 11.5–15.5)
WBC: 6.6 10*3/uL (ref 4.0–10.5)

## 2021-12-04 ENCOUNTER — Encounter: Payer: Self-pay | Admitting: Physical Medicine & Rehabilitation

## 2021-12-04 ENCOUNTER — Encounter: Payer: 59 | Attending: Registered Nurse | Admitting: Physical Medicine & Rehabilitation

## 2021-12-04 ENCOUNTER — Other Ambulatory Visit: Payer: Self-pay

## 2021-12-04 VITALS — BP 128/88 | HR 64 | Ht 68.0 in | Wt 160.0 lb

## 2021-12-04 DIAGNOSIS — M79671 Pain in right foot: Secondary | ICD-10-CM | POA: Diagnosis present

## 2021-12-04 DIAGNOSIS — T07XXXA Unspecified multiple injuries, initial encounter: Secondary | ICD-10-CM | POA: Diagnosis present

## 2021-12-04 DIAGNOSIS — G8918 Other acute postprocedural pain: Secondary | ICD-10-CM
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
## 2021-12-04 NOTE — Patient Instructions (Addendum)
PLEASE FEEL FREE TO CALL OUR OFFICE WITH ANY PROBLEMS OR QUESTIONS ([phone])   SUPPLEMENTS USEFUL FOR OSTEOARTHRITIS: OMEGA 3 FATTY ACIDS, TURMERIC, GINGER, TART CHERRY EXTRACT, CELERY SEED, GLUCOSAMINE WITH CHONDROITIN    RETURN TO DRIVING PLAN:  WITH THE SUPERVISION OF A LICENSED DRIVER, PLEASE DRIVE IN AN EMPTY PARKING LOT FOR AT LEAST 2-3 TRIALS TO TEST REACTION TIME, VISION, USE OF EQUIPMENT IN CAR, ETC.  IF SUCCESSFUL WITH THE PARKING LOT DRIVING, PROCEED TO SUPERVISED DRIVING TRIALS IN YOUR NEIGHBORHOOD STREETS AT LOW TRAFFIC TIMES TO TEST OBSERVATION TO TRAFFIC SIGNALS, REACTION TIME, ETC. PLEASE ATTEMPT AT LEAST 2-3 TRIALS IN YOUR NEIGHBORHOOD.  IF NEIGHBORHOOD DRIVING IS SUCCESSFUL, YOU MAY PROCEED TO DRIVING IN BUSIER AREAS IN YOUR COMMUNITY WITH SUPERVISION OF A LICENSED DRIVER. PLEASE ATTEMPT AT LEAST 4-5 TRIALS.  IF COMMUNITY DRIVING IS SUCCESSFUL, YOU MAY PROCEED TO DRIVING ALONE, DURING THE DAY TIME, IN NON-PEAK TRAFFIC TIMES. YOU SHOULD DRIVE NO FURTHER THAN 30 MINUTES IN ONE DIRECTION. PLEASE DO NOT DRIVE IF YOU FEEL FATIGUED OR UNDER THE INFLUENCE OF MEDICATION.

## 2021-12-06 IMAGING — CR DG FOREARM 2V*L*
2 series · 2 of 2 positions shown · non-contrast
Comparison: June 04, 2021.

CLINICAL DATA: Fracture.

EXAM:
LEFT FOREARM - 2 VIEW

[forearm ap]
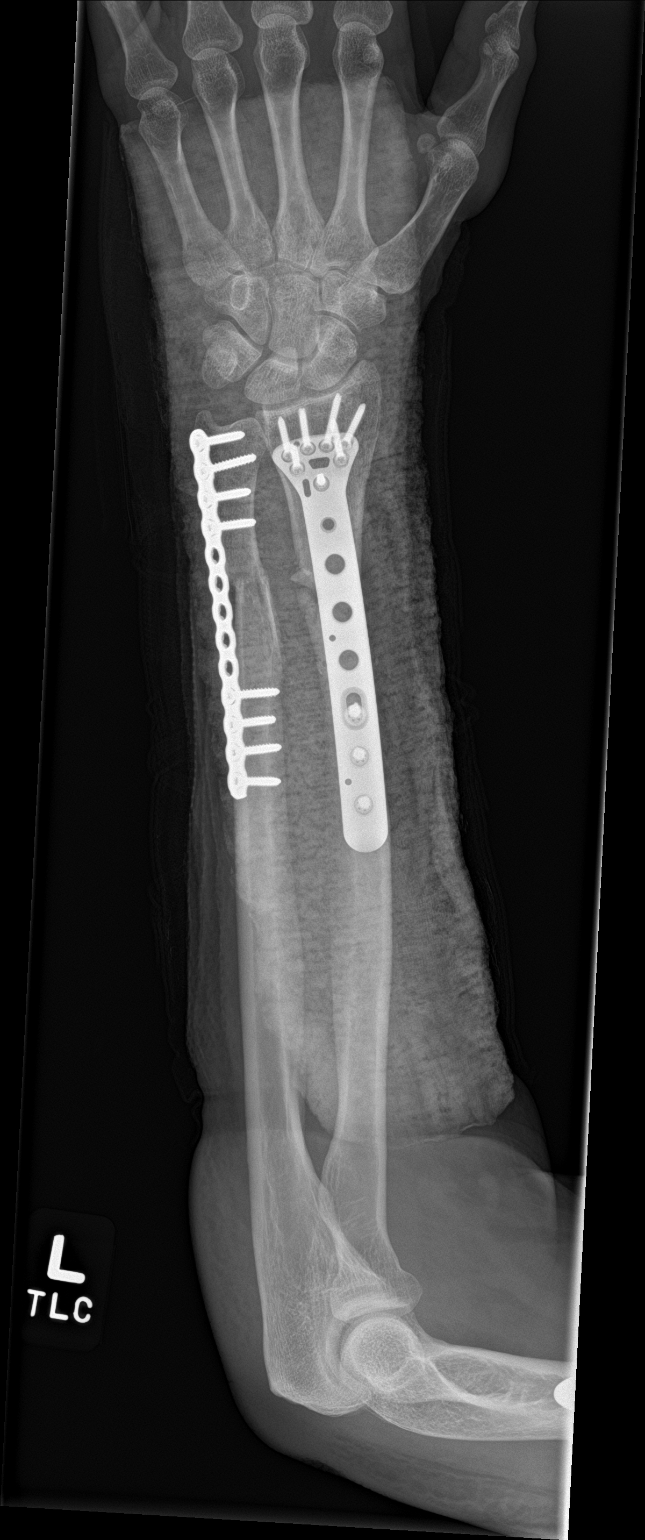

[forearm lat]
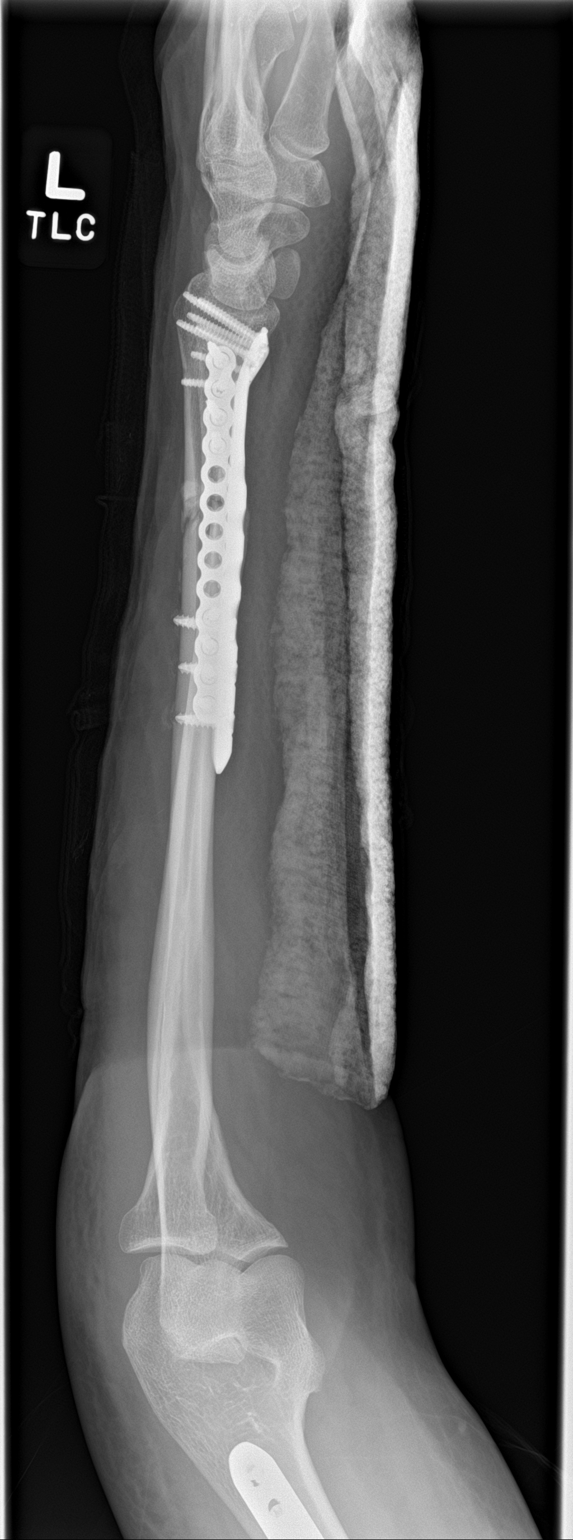

[2 of 2 positions shown; findings below may reference images not displayed]

FINDINGS: The left forearm has been splinted and immobilized. Status post
surgical internal fixation of old distal left radial and ulnar
fractures. Stable line mint of fracture components is noted. No
acute abnormality is noted.
IMPRESSION: Postsurgical changes as described above.

## 2022-05-12 ENCOUNTER — Ambulatory Visit (HOSPITAL_COMMUNITY): Admission: RE | Admit: 2022-05-12 | Payer: 59 | Source: Home / Self Care | Admitting: Orthopedic Surgery

## 2022-05-12 SURGERY — REPAIR, MUSCLE, QUADRICEPS OR HAMSTRING
Anesthesia: Choice | Laterality: Right

## 2022-07-28 ENCOUNTER — Other Ambulatory Visit: Payer: Self-pay | Admitting: Physical Medicine & Rehabilitation

## 2022-07-28 DIAGNOSIS — G479 Sleep disorder, unspecified: Secondary | ICD-10-CM

## 2022-07-28 DIAGNOSIS — F4310 Post-traumatic stress disorder, unspecified: Secondary | ICD-10-CM

## 2022-12-01 ENCOUNTER — Ambulatory Visit: Payer: 59 | Admitting: Family Medicine

## 2022-12-01 ENCOUNTER — Encounter: Payer: Self-pay | Admitting: Family Medicine

## 2022-12-01 VITALS — BP 108/71 | HR 67 | Temp 97.9°F | Ht 68.0 in | Wt 187.2 lb

## 2022-12-01 DIAGNOSIS — Z1231 Encounter for screening mammogram for malignant neoplasm of breast: Secondary | ICD-10-CM

## 2022-12-01 DIAGNOSIS — F5104 Psychophysiologic insomnia: Secondary | ICD-10-CM

## 2022-12-01 DIAGNOSIS — G479 Sleep disorder, unspecified: Secondary | ICD-10-CM

## 2022-12-01 DIAGNOSIS — F4310 Post-traumatic stress disorder, unspecified: Secondary | ICD-10-CM | POA: Diagnosis not present

## 2022-12-01 DIAGNOSIS — I10 Essential (primary) hypertension: Secondary | ICD-10-CM

## 2022-12-01 MED ORDER — METOPROLOL TARTRATE 50 MG PO TABS: ORAL_TABLET | ORAL | 3 refills | Status: AC

## 2022-12-01 MED ORDER — PRAZOSIN HCL 1 MG PO CAPS: ORAL_CAPSULE | ORAL | 3 refills | Status: AC

## 2022-12-01 MED ORDER — TRAZODONE HCL 100 MG PO TABS: 100.0000 mg | ORAL_TABLET | Freq: Every day | ORAL | 3 refills | Status: AC
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
## 2023-06-17 ENCOUNTER — Encounter: Payer: Self-pay | Admitting: Physical Medicine & Rehabilitation

## 2023-06-17 ENCOUNTER — Encounter: Payer: 59 | Attending: Physical Medicine & Rehabilitation | Admitting: Physical Medicine & Rehabilitation

## 2023-06-17 VITALS — BP 121/82 | HR 65 | Ht 68.0 in | Wt 185.0 lb

## 2023-06-17 DIAGNOSIS — S069X1D Unspecified intracranial injury with loss of consciousness of 30 minutes or less, subsequent encounter: Secondary | ICD-10-CM | POA: Diagnosis present

## 2023-06-17 MED ORDER — METHYLPHENIDATE HCL 5 MG PO TABS
5.0000 mg | ORAL_TABLET | Freq: Two times a day (BID) | ORAL | 0 refills | Status: AC
Start: 2023-06-17 — End: ?

## 2023-06-17 NOTE — Patient Instructions (Addendum)
ALWAYS FEEL FREE TO CALL OUR OFFICE WITH ANY PROBLEMS OR QUESTIONS [phone])  **PLEASE NOTE** ALL MEDICATION REFILL REQUESTS (INCLUDING CONTROLLED SUBSTANCES) NEED TO BE MADE AT LEAST 7 DAYS PRIOR TO REFILL BEING DUE. ANY REFILL REQUESTS INSIDE THAT TIME FRAME MAY RESULT IN DELAYS IN RECEIVING YOUR PRESCRIPTION.    TAKE ONE RITALIN BEFORE WORK.

## 2023-06-17 NOTE — Progress Notes (Signed)
Subjective:    Patient ID: Melissa Harrington, female    DOB: 02-03-1971, 52 y.o.   MRN: [id number]  HPI  Mrs. Collman is here in follow up of her TBI and polytrauma. Things in general have gone quite well for her. She does have knee pain due to her injuries but pain seems generally manageable. 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 Typically working 5-6 hours each day she works. She got through the year but found she was exhausted at the end of each day. Her lawyer had her do a neuropsychological evaluation 2 weeks ago to quantify her cognitive abilities. She found that she was completely exhausted and had a migraine after the testing. She was out of commission for a couple days as a result. When at work, it is typically harder the later in the day she goes. She has similar struggles with focus and fatigue when she is out in a crowd for any extended period of time. She is sleeping well with trazodone. Her mood is positive.     Pain Inventory Average Pain 3 Pain Right Now 1 My pain is intermittent, sharp, and aching  In the last 24 hours, has pain interfered with the following? General activity 4 Relation with others 0 Enjoyment of life 3 What TIME of day is your pain at its worst? morning , daytime, evening, and night Sleep (in general) Fair  Pain is worse with: walking, standing, and some activites Pain improves with: rest Relief from Meds:  a little  Family History  Problem Relation Age of Onset   Alcohol abuse Father    Hypertension Father    Cancer Father    Alcohol abuse Maternal Grandmother    Alcohol abuse Maternal Grandfather    Alcohol abuse Paternal Grandmother    Alcohol abuse Paternal Grandfather    Lung cancer Mother 26       Former smoker   Mental illness Father    Heart disease Maternal Grandmother    Allergic rhinitis Neg Hx    Asthma Neg Hx    Eczema Neg Hx    Urticaria Neg Hx    Social History   Socioeconomic History    Marital status: Widowed    Spouse name: Not on file   Number of children: Not on file   Years of education: Not on file   Highest education level: Not on file  Occupational History   Not on file  Tobacco Use   Smoking status: Never   Smokeless tobacco: Never  Vaping Use   Vaping status: Never Used  Substance and Sexual Activity   Alcohol use: Yes    Alcohol/week: 2.0 standard drinks of alcohol    Types: 2 Glasses of wine per week    Comment: occ   Drug use: No   Sexual activity: Yes    Birth control/protection: Surgical  Other Topics Concern   Not on file  Social History Narrative   Widow (2022, husband d MVC), 3 children.   Educ: BA from [employer]   Occup: homemaker   No tob   Social Determinants of Health   Financial Resource Strain: Low Risk  (10/04/2020)   Overall Financial Resource Strain (CARDIA)    Difficulty of Paying Living Expenses: Not hard at all  Food Insecurity: No Food Insecurity (10/04/2020)   Hunger Vital Sign    Worried About Running Out of Food in the Last Year: Never true    Ran Out  of Food in the Last Year: Never true  Transportation Needs: No Transportation Needs (10/04/2020)   PRAPARE - Administrator, Civil Service (Medical): No    Lack of Transportation (Non-Medical): No  Physical Activity: Inactive (10/04/2020)   Exercise Vital Sign    Days of Exercise per Week: 0 days    Minutes of Exercise per Session: 20 min  Stress: Stress Concern Present (10/04/2020)   [employer] of Occupational Health - Occupational Stress Questionnaire    Feeling of Stress : Rather much  Social Connections: Socially Integrated (10/04/2020)   Social Connection and Isolation Panel [NHANES]    Frequency of Communication with Friends and Family: More than three times a week    Frequency of Social Gatherings with Friends and Family: Once a week    Attends Religious Services: More than 4 times per year    Active Member of Clubs or Organizations: No     Attends Banker Meetings: 1 to 4 times per year    Marital Status: Married   Past Surgical History:  Procedure Laterality Date   CAST APPLICATION Left 05/25/2021   Procedure: CAST APPLICATION;  Surgeon: Mack Hook, MD;  Location: [facility] OR;  Service: Orthopedics;  Laterality: Left;   CESAREAN SECTION     3   CLOSED REDUCTION HUMERUS FRACTURE Left 05/25/2021   Procedure: CLOSED REDUCTION HUMERAL SHAFT;  Surgeon: Mack Hook, MD;  Location: [facility] OR;  Service: Orthopedics;  Laterality: Left;   CLOSED REDUCTION RADIAL SHAFT Left 05/25/2021   Procedure: CLOSED REDUCTION RADIUS  AND ULNAR FRACTURE;  Surgeon: Mack Hook, MD;  Location: [facility] OR;  Service: Orthopedics;  Laterality: Left;   COLONOSCOPY  11/19/2020   adenoma x 1.  Recall 5 yrs.   COLONOSCOPY WITH PROPOFOL N/A 11/19/2020   Procedure: COLONOSCOPY WITH PROPOFOL;  Surgeon: Lanelle Bal, DO;  [facility];  Service: Endoscopy;  Laterality: N/A;  8:45 ASA II   ECTOPIC PREGNANCY SURGERY     four surgeries (left fallopian tube removed)   EXTERNAL FIXATION LEG Bilateral 05/25/2021   Procedure: EXTERNAL FIXATION LEG;  Surgeon: Terance Hart, MD;  Location: [facility] OR;  Service: Orthopedics;  Laterality: Bilateral;   FEMUR IM NAIL Left 05/27/2021   Procedure: INTRAMEDULLARY (IM) NAIL FEMORAL;  Surgeon: Myrene Galas, MD;  Location: [facility] OR;  Service: Orthopedics;  Laterality: Left;   I & D EXTREMITY Bilateral 05/25/2021   Procedure: IRRIGATION AND DEBRIDEMENT EXTREMITY;  Surgeon: Terance Hart, MD;  Location: [facility] OR;  Service: Orthopedics;  Laterality: Bilateral;   I & D EXTREMITY Left 05/25/2021   Procedure: IRRIGATION AND DEBRIDEMENT EXTREMITY;  Surgeon: Mack Hook, MD;  Location: [facility] OR;  Service: Orthopedics;  Laterality: Left;   INSERTION OF TRACTION PIN Right 05/25/2021   Procedure: INSERTION OF TRACTION PIN;  Surgeon: Terance Hart, MD;  Location: [facility] OR;  Service: Orthopedics;  Laterality: Right;    LACERATION REPAIR Bilateral 05/25/2021   Procedure: REPAIR MULTIPLE LACERATIONS;  Surgeon: Terance Hart, MD;  Location: [facility] OR;  Service: Orthopedics;  Laterality: Bilateral;   ORIF ACETABULAR FRACTURE Right 06/04/2021   Procedure: OPEN REDUCTION INTERNAL FIXATION (ORIF) RIGHT ACETABULAR FRACTURE;  Surgeon: Myrene Galas, MD;  Location: [facility] OR;  Service: Orthopedics;  Laterality: Right;   ORIF FEMUR FRACTURE Right 05/27/2021   Procedure: OPEN REDUCTION INTERNAL FIXATION (ORIF) DISTAL FEMUR FRACTURE;  Surgeon: Myrene Galas, MD;  Location: [facility] OR;  Service: Orthopedics;  Laterality: Right;   ORIF HUMERUS FRACTURE Left  06/04/2021   Procedure: OPEN REDUCTION INTERNAL FIXATION (ORIF) LEFT DISTAL HUMERUS FRACTURE;  Surgeon: Myrene Galas, MD;  Location: [facility] OR;  Service: Orthopedics;  Laterality: Left;   ORIF RADIAL FRACTURE Left 06/04/2021   Procedure: OPEN REDUCTION INTERNAL FIXATION (ORIF) LEFT FOREARM;  Surgeon: Myrene Galas, MD;  Location: [facility] OR;  Service: Orthopedics;  Laterality: Left;   POLYPECTOMY  11/19/2020   Procedure: POLYPECTOMY;  Surgeon: Lanelle Bal, DO;  [facility];  Service: Endoscopy;;   SKIN CANCER EXCISION  2007   on pt's back    Past Surgical History:  Procedure Laterality Date   CAST APPLICATION Left 05/25/2021   Procedure: CAST APPLICATION;  Surgeon: Mack Hook, MD;  Location: [facility] OR;  Service: Orthopedics;  Laterality: Left;   CESAREAN SECTION     3   CLOSED REDUCTION HUMERUS FRACTURE Left 05/25/2021   Procedure: CLOSED REDUCTION HUMERAL SHAFT;  Surgeon: Mack Hook, MD;  Location: [facility] OR;  Service: Orthopedics;  Laterality: Left;   CLOSED REDUCTION RADIAL SHAFT Left 05/25/2021   Procedure: CLOSED REDUCTION RADIUS  AND ULNAR FRACTURE;  Surgeon: Mack Hook, MD;  Location: [facility] OR;  Service: Orthopedics;  Laterality: Left;   COLONOSCOPY  11/19/2020   adenoma x 1.  Recall 5 yrs.   COLONOSCOPY WITH PROPOFOL N/A 11/19/2020   Procedure: COLONOSCOPY WITH  PROPOFOL;  Surgeon: Lanelle Bal, DO;  [facility];  Service: Endoscopy;  Laterality: N/A;  8:45 ASA II   ECTOPIC PREGNANCY SURGERY     four surgeries (left fallopian tube removed)   EXTERNAL FIXATION LEG Bilateral 05/25/2021   Procedure: EXTERNAL FIXATION LEG;  Surgeon: Terance Hart, MD;  Location: [facility] OR;  Service: Orthopedics;  Laterality: Bilateral;   FEMUR IM NAIL Left 05/27/2021   Procedure: INTRAMEDULLARY (IM) NAIL FEMORAL;  Surgeon: Myrene Galas, MD;  Location: [facility] OR;  Service: Orthopedics;  Laterality: Left;   I & D EXTREMITY Bilateral 05/25/2021   Procedure: IRRIGATION AND DEBRIDEMENT EXTREMITY;  Surgeon: Terance Hart, MD;  Location: [facility] OR;  Service: Orthopedics;  Laterality: Bilateral;   I & D EXTREMITY Left 05/25/2021   Procedure: IRRIGATION AND DEBRIDEMENT EXTREMITY;  Surgeon: Mack Hook, MD;  Location: [facility] OR;  Service: Orthopedics;  Laterality: Left;   INSERTION OF TRACTION PIN Right 05/25/2021   Procedure: INSERTION OF TRACTION PIN;  Surgeon: Terance Hart, MD;  Location: [facility] OR;  Service: Orthopedics;  Laterality: Right;   LACERATION REPAIR Bilateral 05/25/2021   Procedure: REPAIR MULTIPLE LACERATIONS;  Surgeon: Terance Hart, MD;  Location: [facility] OR;  Service: Orthopedics;  Laterality: Bilateral;   ORIF ACETABULAR FRACTURE Right 06/04/2021   Procedure: OPEN REDUCTION INTERNAL FIXATION (ORIF) RIGHT ACETABULAR FRACTURE;  Surgeon: Myrene Galas, MD;  Location: [facility] OR;  Service: Orthopedics;  Laterality: Right;   ORIF FEMUR FRACTURE Right 05/27/2021   Procedure: OPEN REDUCTION INTERNAL FIXATION (ORIF) DISTAL FEMUR FRACTURE;  Surgeon: Myrene Galas, MD;  Location: [facility] OR;  Service: Orthopedics;  Laterality: Right;   ORIF HUMERUS FRACTURE Left 06/04/2021   Procedure: OPEN REDUCTION INTERNAL FIXATION (ORIF) LEFT DISTAL HUMERUS FRACTURE;  Surgeon: Myrene Galas, MD;  Location: [facility] OR;  Service: Orthopedics;  Laterality: Left;   ORIF RADIAL FRACTURE  Left 06/04/2021   Procedure: OPEN REDUCTION INTERNAL FIXATION (ORIF) LEFT FOREARM;  Surgeon: Myrene Galas, MD;  Location: [facility] OR;  Service: Orthopedics;  Laterality: Left;   POLYPECTOMY  11/19/2020   Procedure: POLYPECTOMY;  Surgeon: Lanelle Bal, DO;  [facility];  Service:  Endoscopy;;   SKIN CANCER EXCISION  2007   on pt's back    Past Medical History:  Diagnosis Date   Allergy to alpha-gal 03/2019   Dr. Nunzio Cobbs   Allergy to alpha-gal    Anxiety    + hx of panic attacks.  Was on lexapro for a short time in remote past.   Anxiety    Dyspareunia 10/10/2013   Endometrial polyp 10/17/2013   Essential hypertension    Hay fever    Hemorrhoids 10/10/2013   History of melanoma    HTN (hypertension)    Melanoma (HCC)    Menometrorrhagia 2013   using herbal treatments and this has resolved.   Other and unspecified ovarian cyst 10/17/2013   PONV (postoperative nausea and vomiting)    Sleep disorder 09/18/2021   BP 121/82   Pulse 65   Ht 5\' 8"  (1.727 m)   Wt 185 lb (83.9 kg)   SpO2 98%   BMI 28.13 kg/m   Opioid Risk Score:   Fall Risk Score:  `1  Depression screen [facility] 2/9     06/17/2023    9:05 AM 12/01/2022    3:56 PM 12/04/2021   10:36 AM 07/24/2021   11:40 AM 10/04/2020    3:15 PM 03/03/2019    2:19 PM 02/23/2017    1:48 PM  Depression screen PHQ 2/9  Decreased Interest 1 0 0 0 1 0 0  Down, Depressed, Hopeless 1 0 0 0 1 0 0  PHQ - 2 Score 2 0 0 0 2 0 0  Altered sleeping    0 3 3   Tired, decreased energy    1 3 1    Change in appetite    1 3 0   Feeling bad or failure about yourself     0 0 0   Trouble concentrating    0 0 1   Moving slowly or fidgety/restless    0 0 0   Suicidal thoughts    0 0 0   PHQ-9 Score    2 11 5    Difficult doing work/chores      Somewhat difficult     Review of Systems  Musculoskeletal:        Right hip & right knee pain      Objective:   Physical Exam  General: No acute distress HEENT: NCAT, EOMI, oral membranes  moist Cards: reg rate  Chest: normal effort Abdomen: Soft, NT, ND Skin: dry, intact Extremities: no edema Psych: pleasant and appropriate  Skin: intact Neuro:Alert and oriented x 3. Normal insight and awareness. Intact Memory. Normal language and speech. Cranial nerve exam unremarkable. MMT: normal movement/motor.   Musculoskeletal: wide based gait           Assessment & Plan:    1.  Functional and mobility deficits secondary to TBI with polytrauma -she has made nice gains! -howevershe is struggling with higher level concentration/stamina when she works which is directly related to her brain injury.  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 -she is sleeping well -trial of ritalin 5mg  prior teaching duties to help improve concentration 2.    Pain Management: Tylenol as needed, tramadol per Dr. Milinda Cave             -gave list of supplements for oa, pain 4. Mood/sleep:                -trazodone to 100  mg               5  Left humerus Fx and Left BBFx s/p ORIF, Right acetabular /Femur Fx s/p ORIF, Open left femur Fx s/p ORIF, left acetab fx and SPR/IPR fx's, L-open BBFA Fx s/p ORIF, 5th MT fx all per ortho    20 minutes of face to face patient care time were spent during this visit. All questions were encouraged and answered. Follow up with me in 3 months,

## 2023-08-07 ENCOUNTER — Ambulatory Visit
Admission: RE | Admit: 2023-08-07 | Discharge: 2023-08-07 | Disposition: A | Discharge status: Home / Self Care | Payer: Self-pay | Source: Ambulatory Visit | Attending: Family Medicine | Admitting: Family Medicine

## 2023-08-07 VITALS — BP 123/84 | HR 75 | Temp 98.5°F | Resp 18

## 2023-08-07 DIAGNOSIS — W540XXA Bitten by dog, initial encounter: Secondary | ICD-10-CM

## 2023-08-07 DIAGNOSIS — L089 Local infection of the skin and subcutaneous tissue, unspecified: Secondary | ICD-10-CM

## 2023-08-07 DIAGNOSIS — J029 Acute pharyngitis, unspecified: Secondary | ICD-10-CM

## 2023-08-07 DIAGNOSIS — T148XXA Other injury of unspecified body region, initial encounter: Secondary | ICD-10-CM

## 2023-08-07 LAB — POCT RAPID STREP A (OFFICE): Rapid Strep A Screen: NEGATIVE

## 2023-08-07 MED ORDER — CLINDAMYCIN HCL 300 MG PO CAPS
300.0000 mg | ORAL_CAPSULE | Freq: Two times a day (BID) | ORAL | 0 refills | Status: AC
Start: 2023-08-07 — End: ?

## 2023-08-07 NOTE — ED Triage Notes (Signed)
Pt reports she has had a sore throat x 3 days.

## 2023-08-07 NOTE — Discharge Instructions (Signed)
Your strep test was negative.  You may do salt water gargles, over-the-counter throat sprays and lozenges, ibuprofen and Tylenol.  Clean the cut to your left arm with gentle soap and water or Hibiclens solution, apply Neosporin and a bandage until fully healed.  Take the full course of antibiotics given.

## 2023-08-07 NOTE — ED Notes (Signed)
Dressing applied to left forearm. Per provider, pt received scratch from puppy on left forearm.   Bacitracin and nonadherent pad placed. Secured with coban. Pt tolerated well.   Site management and infection prevention education provided. Pt verbalized understanding.

## 2023-08-08 NOTE — ED Provider Notes (Signed)
[facility]    CSN: [id number] Arrival date & time: 08/07/23  0957      History   Chief Complaint Chief Complaint  Patient presents with   Sore Throat    Entered by patient    HPI Melissa Harrington is a 52 y.o. female.   Presenting today with 3 day history of sore throat. Denies fever, chills, CP, SOB, abdominal pain, N/V/D. So far trying OTC remedies with minimal relief.     Past Medical History:  Diagnosis Date   Allergy to alpha-gal 03/2019   Dr. Nunzio Cobbs   Allergy to alpha-gal    Anxiety    + hx of panic attacks.  Was on lexapro for a short time in remote past.   Anxiety    Dyspareunia 10/10/2013   Endometrial polyp 10/17/2013   Essential hypertension    Hay fever    Hemorrhoids 10/10/2013   History of melanoma    HTN (hypertension)    Melanoma (HCC)    Menometrorrhagia 2013   using herbal treatments and this has resolved.   Other and unspecified ovarian cyst 10/17/2013   PONV (postoperative nausea and vomiting)    Sleep disorder 09/18/2021    Patient Active Problem List   Diagnosis Date Noted   Diplopia 09/18/2021   Sleep disorder 09/18/2021   Right foot pain 09/18/2021   Pulmonary emboli (HCC) 07/16/2021   Acute blood loss anemia    Hypokalemia    PTSD (post-traumatic stress disorder)    Postoperative pain    Critical polytrauma 06/21/2021   Pressure injury of skin 06/13/2021   TBI (traumatic brain injury) (HCC) 05/25/2021   Laceration of face with complication 05/25/2021   Frontal sinus fracture (HCC) 05/25/2021   Allergic reaction 04/12/2019   Other allergic rhinitis 04/12/2019   Allergic conjunctivitis 04/12/2019   Elevated blood-pressure reading without diagnosis of hypertension 04/12/2019   Endometrial polyp 10/17/2013   Other and unspecified ovarian cyst 10/17/2013   Vaginal itching 10/10/2013   Hemorrhoids 10/10/2013   Dyspareunia 10/10/2013    Past Surgical History:  Procedure Laterality Date   CAST APPLICATION Left  05/25/2021   Procedure: CAST APPLICATION;  Surgeon: Mack Hook, MD;  Location: [facility] OR;  Service: Orthopedics;  Laterality: Left;   CESAREAN SECTION     3   CLOSED REDUCTION HUMERUS FRACTURE Left 05/25/2021   Procedure: CLOSED REDUCTION HUMERAL SHAFT;  Surgeon: Mack Hook, MD;  Location: [facility] OR;  Service: Orthopedics;  Laterality: Left;   CLOSED REDUCTION RADIAL SHAFT Left 05/25/2021   Procedure: CLOSED REDUCTION RADIUS  AND ULNAR FRACTURE;  Surgeon: Mack Hook, MD;  Location: [facility] OR;  Service: Orthopedics;  Laterality: Left;   COLONOSCOPY  11/19/2020   adenoma x 1.  Recall 5 yrs.   COLONOSCOPY WITH PROPOFOL N/A 11/19/2020   Procedure: COLONOSCOPY WITH PROPOFOL;  Surgeon: Lanelle Bal, DO;  [facility];  Service: Endoscopy;  Laterality: N/A;  8:45 ASA II   ECTOPIC PREGNANCY SURGERY     four surgeries (left fallopian tube removed)   EXTERNAL FIXATION LEG Bilateral 05/25/2021   Procedure: EXTERNAL FIXATION LEG;  Surgeon: Terance Hart, MD;  Location: [facility] OR;  Service: Orthopedics;  Laterality: Bilateral;   FEMUR IM NAIL Left 05/27/2021   Procedure: INTRAMEDULLARY (IM) NAIL FEMORAL;  Surgeon: Myrene Galas, MD;  Location: [facility] OR;  Service: Orthopedics;  Laterality: Left;   I & D EXTREMITY Bilateral 05/25/2021   Procedure: IRRIGATION AND DEBRIDEMENT EXTREMITY;  Surgeon: Terance Hart, MD;  Location: [facility] OR;  Service: Orthopedics;  Laterality: Bilateral;   I & D EXTREMITY Left 05/25/2021   Procedure: IRRIGATION AND DEBRIDEMENT EXTREMITY;  Surgeon: Mack Hook, MD;  Location: [facility] OR;  Service: Orthopedics;  Laterality: Left;   INSERTION OF TRACTION PIN Right 05/25/2021   Procedure: INSERTION OF TRACTION PIN;  Surgeon: Terance Hart, MD;  Location: [facility] OR;  Service: Orthopedics;  Laterality: Right;   LACERATION REPAIR Bilateral 05/25/2021   Procedure: REPAIR MULTIPLE LACERATIONS;  Surgeon: Terance Hart, MD;  Location: [facility] OR;  Service: Orthopedics;  Laterality:  Bilateral;   ORIF ACETABULAR FRACTURE Right 06/04/2021   Procedure: OPEN REDUCTION INTERNAL FIXATION (ORIF) RIGHT ACETABULAR FRACTURE;  Surgeon: Myrene Galas, MD;  Location: [facility] OR;  Service: Orthopedics;  Laterality: Right;   ORIF FEMUR FRACTURE Right 05/27/2021   Procedure: OPEN REDUCTION INTERNAL FIXATION (ORIF) DISTAL FEMUR FRACTURE;  Surgeon: Myrene Galas, MD;  Location: [facility] OR;  Service: Orthopedics;  Laterality: Right;   ORIF HUMERUS FRACTURE Left 06/04/2021   Procedure: OPEN REDUCTION INTERNAL FIXATION (ORIF) LEFT DISTAL HUMERUS FRACTURE;  Surgeon: Myrene Galas, MD;  Location: [facility] OR;  Service: Orthopedics;  Laterality: Left;   ORIF RADIAL FRACTURE Left 06/04/2021   Procedure: OPEN REDUCTION INTERNAL FIXATION (ORIF) LEFT FOREARM;  Surgeon: Myrene Galas, MD;  Location: [facility] OR;  Service: Orthopedics;  Laterality: Left;   POLYPECTOMY  11/19/2020   Procedure: POLYPECTOMY;  Surgeon: Lanelle Bal, DO;  Location: [facility];  Service: Endoscopy;;   SKIN CANCER EXCISION  2007   on pt's back     OB History     Gravida  9   Para  3   Term  3   Preterm  0   AB  6   Living  3      SAB  2   IAB  0   Ectopic  4   Multiple      Live Births  3            Home Medications    Prior to Admission medications   Medication Sig Start Date End Date Taking? Authorizing Provider  clindamycin (CLEOCIN) 300 MG capsule Take 1 capsule (300 mg total) by mouth 2 (two) times daily. 08/07/23  Yes Particia Nearing, PA-C  acetaminophen (TYLENOL) 325 MG tablet Take 2 tablets (650 mg total) by mouth every 6 (six) hours. Patient taking differently: Take 650 mg by mouth every 6 (six) hours as needed for moderate pain. 07/12/21   Love, Evlyn Kanner, PA-C  EPINEPHrine (AUVI-Q) 0.3 mg/0.3 mL IJ SOAJ injection Inject 0.3 mLs (0.3 mg total) into the muscle as needed for anaphylaxis. 04/12/19   Bobbitt, Heywood Iles, MD  melatonin 3 MG TABS tablet Take 1 tablet (3 mg total) by mouth at  bedtime. Patient taking differently: Take 3 mg by mouth at bedtime as needed (sleep). 07/12/21   Love, Evlyn Kanner, PA-C  methocarbamol (ROBAXIN) 500 MG tablet NEW PRESCRIPTION REQUEST: TAKE ONE TABLET BY MOUTH FOUR TIMES DAILY 11/21/21   McGowen, Maryjean Morn, MD  methylphenidate (RITALIN) 5 MG tablet Take 1 tablet (5 mg total) by mouth 2 (two) times daily. 06/17/23   Ranelle Oyster, MD  metoprolol tartrate (LOPRESSOR) 50 MG tablet NEW PRESCRIPTION REQUEST: TAKE ONE TABLET BY MOUTH TWICE DAILY 12/01/22   McGowen, Maryjean Morn, MD  Multiple Vitamin (MULTIVITAMIN ADULT PO) Take 1 tablet by mouth daily.    [provider]  prazosin (MINIPRESS) 1 MG capsule NEW PRESCRIPTION REQUEST: TAKE TWO  CAPSULES BY MOUTH EVERY EVENING 12/01/22   McGowen, Maryjean Morn, MD  traMADol (ULTRAM) 50 MG tablet NEW PRESCRIPTION REQUEST: TAKE ONE TABLET BY MOUTH FOUR TIMES DAILY 11/21/21   McGowen, Maryjean Morn, MD  traZODone (DESYREL) 100 MG tablet Take 1 tablet (100 mg total) by mouth at bedtime. 12/01/22   McGowen, Maryjean Morn, MD    Family History Family History  Problem Relation Age of Onset   Alcohol abuse Father    Hypertension Father    Cancer Father    Alcohol abuse Maternal Grandmother    Alcohol abuse Maternal Grandfather    Alcohol abuse Paternal Grandmother    Alcohol abuse Paternal Grandfather    Lung cancer Mother 86       Former smoker   Mental illness Father    Heart disease Maternal Grandmother    Allergic rhinitis Neg Hx    Asthma Neg Hx    Eczema Neg Hx    Urticaria Neg Hx     Social History Social History   Tobacco Use   Smoking status: Never   Smokeless tobacco: Never  Vaping Use   Vaping status: Never Used  Substance Use Topics   Alcohol use: Yes    Alcohol/week: 2.0 standard drinks of alcohol    Types: 2 Glasses of wine per week    Comment: occ   Drug use: No     Allergies   Alpha-gal, Penicillins, Amlodipine, Benazepril, Chlorhexidine gluconate, and Erythromycin   Review of  Systems Review of Systems PER HPI  Physical Exam Triage Vital Signs ED Triage Vitals  Encounter Vitals Group     BP 08/07/23 1022 123/84     Systolic BP Percentile --      Diastolic BP Percentile --      Pulse Rate 08/07/23 1022 75     Resp 08/07/23 1022 18     Temp 08/07/23 1022 98.5 F (36.9 C)     Temp Source 08/07/23 1022 Oral     SpO2 08/07/23 1022 96 %     Weight --      Height --      Head Circumference --      Peak Flow --      Pain Score 08/07/23 1023 4     Pain Loc --      Pain Education --      Exclude from Growth Chart --    No data found.  Updated Vital Signs BP 123/84 (BP Location: Right Arm)   Pulse 75   Temp 98.5 F (36.9 C) (Oral)   Resp 18   LMP 12/19/2019 (Approximate)   SpO2 96%   Visual Acuity Right Eye Distance:   Left Eye Distance:   Bilateral Distance:    Right Eye Near:   Left Eye Near:    Bilateral Near:     Physical Exam Vitals and nursing note reviewed.  Constitutional:      Appearance: Normal appearance.  HENT:     Head: Atraumatic.     Right Ear: Tympanic membrane and external ear normal.     Left Ear: Tympanic membrane and external ear normal.     Mouth/Throat:     Mouth: Mucous membranes are moist.     Pharynx: Posterior oropharyngeal erythema present. No oropharyngeal exudate.  Eyes:     Extraocular Movements: Extraocular movements intact.     Conjunctiva/sclera: Conjunctivae normal.  Cardiovascular:     Rate and Rhythm: Normal rate and regular rhythm.     Heart  sounds: Normal heart sounds.  Pulmonary:     Effort: Pulmonary effort is normal.     Breath sounds: Normal breath sounds. No wheezing.  Musculoskeletal:        General: Normal range of motion.     Cervical back: Normal range of motion and neck supple.  Lymphadenopathy:     Cervical: No cervical adenopathy.  Skin:    General: Skin is warm and dry.     Comments: Superficial laceration to left forearm with surrounding erythema and edema. No active drainage  or bleeding  Neurological:     Mental Status: She is alert and oriented to person, place, and time.  Psychiatric:        Mood and Affect: Mood normal.        Thought Content: Thought content normal.      UC Treatments / Results  Labs (all labs ordered are listed, but only abnormal results are displayed) Labs Reviewed  POCT RAPID STREP A (OFFICE)    EKG   Radiology No results found.  Procedures Procedures (including critical care time)  Medications Ordered in UC Medications - No data to display  Initial Impression / Assessment and Plan / UC Course  I have reviewed the triage vital signs and the nursing notes.  Pertinent labs & imaging results that were available during my care of the patient were reviewed by me and considered in my medical decision making (see chart for details).     Rapid strep negative and exam reassuring with regard to sore throat, but on exam noted infected superficial laceration to left forearm which patient states is from her puppy's tooth dragging against her arm. She states he is UTD on vaccines and she is UTD on tdap per chart review  (2022). Treat with augmentin, topical wound care.   Final Clinical Impressions(s) / UC Diagnoses   Final diagnoses:  Acute pharyngitis, unspecified etiology  Infected abrasion  Dog bite, initial encounter     Discharge Instructions      Your strep test was negative.  You may do salt water gargles, over-the-counter throat sprays and lozenges, ibuprofen and Tylenol.  Clean the cut to your left arm with gentle soap and water or Hibiclens solution, apply Neosporin and a bandage until fully healed.  Take the full course of antibiotics given.    ED Prescriptions     Medication Sig Dispense Auth. Provider   clindamycin (CLEOCIN) 300 MG capsule Take 1 capsule (300 mg total) by mouth 2 (two) times daily. 14 capsule Particia Nearing, [place]      PDMP not reviewed this encounter.   Particia Nearing,  [place] 08/08/23 2024

## 2023-08-24 ENCOUNTER — Telehealth: Payer: Self-pay | Admitting: Family Medicine

## 2023-08-24 NOTE — Telephone Encounter (Signed)
Dahlia Client with [employer] called to follow up regarding a life care plan for [facility]. She would like to scheduled a time to speak with Dr. Milinda Cave and can reached at [phone]. Raynelle Fanning or Dahlia Client can take the call and schedule a time with Dr. Milinda Cave.

## 2023-08-25 NOTE — Telephone Encounter (Signed)
Spoke with Dahlia Client regarding provider recommendations, he would like to discuss with the patient prior to speaking with any of their staff or answering additional questions. LVM for pt to return call.

## 2023-09-08 NOTE — Telephone Encounter (Signed)
Called pt but unable to leave VM. Sent pt a mychart to call the office and schedule virtual.

## 2023-09-08 NOTE — Telephone Encounter (Signed)
fyi

## 2023-09-08 NOTE — Telephone Encounter (Signed)
Please call patient and inform her of the request I have received from . I would like to talk to her about this first. Virtual appointment is okay.

## 2023-09-08 NOTE — Telephone Encounter (Signed)
Dahlia Client calling back to check status of request to speak to Dr. Milinda Cave regarding regarding life care plan for patient.  Dahlia Client with [employer] [phone]

## 2023-09-16 ENCOUNTER — Encounter: Payer: Self-pay | Admitting: Physical Medicine & Rehabilitation

## 2023-09-29 ENCOUNTER — Telehealth: Payer: Self-pay

## 2023-09-29 NOTE — Telephone Encounter (Signed)
[phone] 

## 2023-09-29 NOTE — Telephone Encounter (Signed)
Albin Felling from [employer] called stating she has called three times to get an updated billing and a curriculum vitae on Dr. Riley Kill. Not sure where to get this information from for her. I see that she sent a fax to Carmen/Dr. Riley Kill  back in September so maybe Porfirio Mylar was handling it. Can you help please?

## 2023-11-29 ENCOUNTER — Other Ambulatory Visit: Payer: Self-pay | Admitting: Family Medicine

## 2023-11-29 DIAGNOSIS — G479 Sleep disorder, unspecified: Secondary | ICD-10-CM

## 2023-11-29 DIAGNOSIS — F4310 Post-traumatic stress disorder, unspecified: Secondary | ICD-10-CM

## 2023-12-15 ENCOUNTER — Other Ambulatory Visit: Payer: Self-pay | Admitting: Family Medicine

## 2023-12-21 ENCOUNTER — Telehealth: Payer: Self-pay | Admitting: Family Medicine

## 2023-12-21 NOTE — Telephone Encounter (Signed)
Copied from CRM [id number]. Topic: Clinical - Prescription Issue >> Dec 21, 2023  9:26 AM Elizebeth Brooking wrote: Reason for CRM: Patient called in regarding the status of her medication refilled of prescription metoprolol tartrate (LOPRESSOR) 50 MG tablet prazosin (MINIPRESS) 1 MG capsule , informed patient that medication is pending and being work on as of the 28th, she stated the pharmacy has stated they have not received it

## 2023-12-22 ENCOUNTER — Other Ambulatory Visit: Payer: Self-pay | Admitting: Family Medicine

## 2023-12-22 NOTE — Telephone Encounter (Signed)
Pt overdue for office visit
